# Patient Record
Sex: Male | Born: 1935 | Race: White | Hispanic: No | Marital: Married | Smoking: Former smoker
Health system: Southern US, Community
[De-identification: ages and names within clinical notes are randomized; demographics above are authoritative.]

## PROBLEM LIST (undated history)

## (undated) DIAGNOSIS — I2510 Atherosclerotic heart disease of native coronary artery without angina pectoris: Secondary | ICD-10-CM

## (undated) DIAGNOSIS — I110 Hypertensive heart disease with heart failure: Secondary | ICD-10-CM

## (undated) DIAGNOSIS — I4891 Unspecified atrial fibrillation: Secondary | ICD-10-CM

## (undated) DIAGNOSIS — N400 Enlarged prostate without lower urinary tract symptoms: Secondary | ICD-10-CM

## (undated) DIAGNOSIS — E785 Hyperlipidemia, unspecified: Secondary | ICD-10-CM

## (undated) DIAGNOSIS — I5032 Chronic diastolic (congestive) heart failure: Secondary | ICD-10-CM

## (undated) DIAGNOSIS — F0390 Unspecified dementia without behavioral disturbance: Secondary | ICD-10-CM

## (undated) DIAGNOSIS — I739 Peripheral vascular disease, unspecified: Secondary | ICD-10-CM

---

## 2019-12-28 DIAGNOSIS — I5032 Chronic diastolic (congestive) heart failure: Secondary | ICD-10-CM | POA: Diagnosis not present

## 2019-12-28 DIAGNOSIS — M5416 Radiculopathy, lumbar region: Secondary | ICD-10-CM

## 2019-12-28 DIAGNOSIS — I10 Essential (primary) hypertension: Secondary | ICD-10-CM

## 2019-12-28 DIAGNOSIS — I739 Peripheral vascular disease, unspecified: Secondary | ICD-10-CM

## 2019-12-28 DIAGNOSIS — F05 Delirium due to known physiological condition: Secondary | ICD-10-CM | POA: Diagnosis not present

## 2019-12-28 DIAGNOSIS — G2581 Restless legs syndrome: Secondary | ICD-10-CM

## 2019-12-28 DIAGNOSIS — I480 Paroxysmal atrial fibrillation: Secondary | ICD-10-CM | POA: Diagnosis not present

## 2019-12-28 DIAGNOSIS — N400 Enlarged prostate without lower urinary tract symptoms: Secondary | ICD-10-CM

## 2019-12-28 DIAGNOSIS — N390 Urinary tract infection, site not specified: Secondary | ICD-10-CM | POA: Diagnosis not present

## 2019-12-30 ENCOUNTER — Emergency Department: Payer: Medicare Other

## 2019-12-30 ENCOUNTER — Other Ambulatory Visit: Payer: Self-pay

## 2019-12-30 ENCOUNTER — Inpatient Hospital Stay
Admission: EM | Admit: 2019-12-30 | Discharge: 2020-01-03 | DRG: 177 | Disposition: A | Discharge status: Skilled Nursing Facility | Payer: Medicare Other | Source: Skilled Nursing Facility | Attending: Family Medicine | Admitting: Family Medicine

## 2019-12-30 ENCOUNTER — Encounter: Payer: Self-pay | Admitting: Intensive Care

## 2019-12-30 DIAGNOSIS — Y846 Urinary catheterization as the cause of abnormal reaction of the patient, or of later complication, without mention of misadventure at the time of the procedure: Secondary | ICD-10-CM | POA: Diagnosis present

## 2019-12-30 DIAGNOSIS — J1289 Other viral pneumonia: Secondary | ICD-10-CM | POA: Diagnosis present

## 2019-12-30 DIAGNOSIS — R0902 Hypoxemia: Secondary | ICD-10-CM | POA: Diagnosis not present

## 2019-12-30 DIAGNOSIS — I739 Peripheral vascular disease, unspecified: Secondary | ICD-10-CM | POA: Diagnosis present

## 2019-12-30 DIAGNOSIS — K219 Gastro-esophageal reflux disease without esophagitis: Secondary | ICD-10-CM | POA: Diagnosis present

## 2019-12-30 DIAGNOSIS — D62 Acute posthemorrhagic anemia: Secondary | ICD-10-CM | POA: Diagnosis present

## 2019-12-30 DIAGNOSIS — U071 COVID-19: Principal | ICD-10-CM | POA: Diagnosis present

## 2019-12-30 DIAGNOSIS — L89312 Pressure ulcer of right buttock, stage 2: Secondary | ICD-10-CM | POA: Diagnosis present

## 2019-12-30 DIAGNOSIS — Z66 Do not resuscitate: Secondary | ICD-10-CM | POA: Diagnosis present

## 2019-12-30 DIAGNOSIS — G40909 Epilepsy, unspecified, not intractable, without status epilepticus: Secondary | ICD-10-CM | POA: Diagnosis present

## 2019-12-30 DIAGNOSIS — I110 Hypertensive heart disease with heart failure: Secondary | ICD-10-CM | POA: Diagnosis present

## 2019-12-30 DIAGNOSIS — J9601 Acute respiratory failure with hypoxia: Secondary | ICD-10-CM | POA: Diagnosis present

## 2019-12-30 DIAGNOSIS — T380X5A Adverse effect of glucocorticoids and synthetic analogues, initial encounter: Secondary | ICD-10-CM | POA: Diagnosis present

## 2019-12-30 DIAGNOSIS — I4891 Unspecified atrial fibrillation: Secondary | ICD-10-CM | POA: Diagnosis present

## 2019-12-30 DIAGNOSIS — C642 Malignant neoplasm of left kidney, except renal pelvis: Secondary | ICD-10-CM | POA: Diagnosis present

## 2019-12-30 DIAGNOSIS — G9341 Metabolic encephalopathy: Secondary | ICD-10-CM | POA: Diagnosis present

## 2019-12-30 DIAGNOSIS — E785 Hyperlipidemia, unspecified: Secondary | ICD-10-CM | POA: Diagnosis present

## 2019-12-30 DIAGNOSIS — I5032 Chronic diastolic (congestive) heart failure: Secondary | ICD-10-CM | POA: Diagnosis present

## 2019-12-30 DIAGNOSIS — R310 Gross hematuria: Secondary | ICD-10-CM | POA: Diagnosis present

## 2019-12-30 DIAGNOSIS — T83031A Leakage of indwelling urethral catheter, initial encounter: Secondary | ICD-10-CM | POA: Diagnosis present

## 2019-12-30 DIAGNOSIS — Z7901 Long term (current) use of anticoagulants: Secondary | ICD-10-CM

## 2019-12-30 DIAGNOSIS — F0390 Unspecified dementia without behavioral disturbance: Secondary | ICD-10-CM | POA: Diagnosis not present

## 2019-12-30 DIAGNOSIS — I2510 Atherosclerotic heart disease of native coronary artery without angina pectoris: Secondary | ICD-10-CM | POA: Diagnosis present

## 2019-12-30 DIAGNOSIS — L8990 Pressure ulcer of unspecified site, unspecified stage: Secondary | ICD-10-CM | POA: Diagnosis not present

## 2019-12-30 DIAGNOSIS — Z87440 Personal history of urinary (tract) infections: Secondary | ICD-10-CM

## 2019-12-30 DIAGNOSIS — N400 Enlarged prostate without lower urinary tract symptoms: Secondary | ICD-10-CM | POA: Diagnosis present

## 2019-12-30 DIAGNOSIS — R319 Hematuria, unspecified: Secondary | ICD-10-CM

## 2019-12-30 DIAGNOSIS — I482 Chronic atrial fibrillation, unspecified: Secondary | ICD-10-CM | POA: Diagnosis present

## 2019-12-30 HISTORY — DX: Chronic diastolic (congestive) heart failure: I50.32

## 2019-12-30 HISTORY — DX: Unspecified dementia, unspecified severity, without behavioral disturbance, psychotic disturbance, mood disturbance, and anxiety: F03.90

## 2019-12-30 HISTORY — DX: Peripheral vascular disease, unspecified: I73.9

## 2019-12-30 HISTORY — DX: Atherosclerotic heart disease of native coronary artery without angina pectoris: I25.10

## 2019-12-30 HISTORY — DX: Unspecified atrial fibrillation: I48.91

## 2019-12-30 HISTORY — DX: Benign prostatic hyperplasia without lower urinary tract symptoms: N40.0

## 2019-12-30 HISTORY — DX: Hypertensive heart disease with heart failure: I11.0

## 2019-12-30 HISTORY — DX: Hyperlipidemia, unspecified: E78.5

## 2019-12-30 LAB — COMPREHENSIVE METABOLIC PANEL
ALT: 22 U/L (ref 0–44)
AST: 24 U/L (ref 15–41)
Albumin: 2.6 g/dL — ABNORMAL LOW (ref 3.5–5.0)
Alkaline Phosphatase: 53 U/L (ref 38–126)
Anion gap: 10 (ref 5–15)
BUN: 17 mg/dL (ref 8–23)
CO2: 21 mmol/L — ABNORMAL LOW (ref 22–32)
Calcium: 7.7 mg/dL — ABNORMAL LOW (ref 8.9–10.3)
Chloride: 103 mmol/L (ref 98–111)
Creatinine, Ser: 0.84 mg/dL (ref 0.61–1.24)
GFR calc Af Amer: >60 mL/min (ref >60)
GFR calc non Af Amer: >60 mL/min (ref >60)
Glucose, Bld: 133 mg/dL — ABNORMAL HIGH (ref 70–99)
Potassium: 4.1 mmol/L (ref 3.5–5.1)
Sodium: 134 mmol/L — ABNORMAL LOW (ref 135–145)
Total Bilirubin: 0.8 mg/dL (ref 0.3–1.2)
Total Protein: 6.8 g/dL (ref 6.5–8.1)

## 2019-12-30 LAB — CBC WITH DIFFERENTIAL/PLATELET
Abs Immature Granulocytes: 0.33 10*3/uL — ABNORMAL HIGH (ref 0.00–0.07)
Basophils Absolute: 0 10*3/uL (ref 0.0–0.1)
Basophils Relative: 0 %
Eosinophils Absolute: 0 10*3/uL (ref 0.0–0.5)
Eosinophils Relative: 0 %
HCT: 38.1 % — ABNORMAL LOW (ref 39.0–52.0)
Hemoglobin: 12.8 g/dL — ABNORMAL LOW (ref 13.0–17.0)
Immature Granulocytes: 5 %
Lymphocytes Relative: 7 %
Lymphs Abs: 0.5 10*3/uL — ABNORMAL LOW (ref 0.7–4.0)
MCH: 31.1 pg (ref 26.0–34.0)
MCHC: 33.6 g/dL (ref 30.0–36.0)
MCV: 92.7 fL (ref 80.0–100.0)
Monocytes Absolute: 2.7 10*3/uL — ABNORMAL HIGH (ref 0.1–1.0)
Monocytes Relative: 39 %
Neutro Abs: 3.3 10*3/uL (ref 1.7–7.7)
Neutrophils Relative %: 49 %
Platelets: 184 10*3/uL (ref 150–400)
RBC: 4.11 MIL/uL — ABNORMAL LOW (ref 4.22–5.81)
RDW: 12.9 % (ref 11.5–15.5)
WBC: 6.9 10*3/uL (ref 4.0–10.5)
nRBC: 0 % (ref 0.0–0.2)

## 2019-12-30 LAB — URINALYSIS, COMPLETE (UACMP) WITH MICROSCOPIC
Bilirubin Urine: NEGATIVE
Glucose, UA: NEGATIVE mg/dL
Ketones, ur: NEGATIVE mg/dL
Leukocytes,Ua: NEGATIVE
Nitrite: POSITIVE — AB
Protein, ur: 100 mg/dL — AB
RBC / HPF: >50 RBC/hpf — ABNORMAL HIGH (ref 0–5)
Specific Gravity, Urine: 1.008 (ref 1.005–1.030)
Squamous Epithelial / HPF: NONE SEEN (ref 0–5)
pH: 6 (ref 5.0–8.0)

## 2019-12-30 LAB — POC SARS CORONAVIRUS 2 AG: SARS Coronavirus 2 Ag: POSITIVE — AB

## 2019-12-30 LAB — PROTIME-INR
INR: 1.7 — ABNORMAL HIGH (ref 0.8–1.2)
Prothrombin Time: 19.4 seconds — ABNORMAL HIGH (ref 11.4–15.2)

## 2019-12-30 LAB — C-REACTIVE PROTEIN: CRP: 17.6 mg/dL — ABNORMAL HIGH (ref <1.0)

## 2019-12-30 LAB — ABO/RH: ABO/RH(D): A POS

## 2019-12-30 LAB — FIBRIN DERIVATIVES D-DIMER (ARMC ONLY): Fibrin derivatives D-dimer (ARMC): 1256.7 ng/mL (FEU) — ABNORMAL HIGH (ref 0.00–499.00)

## 2019-12-30 LAB — FERRITIN: Ferritin: 344 ng/mL — ABNORMAL HIGH (ref 24–336)

## 2019-12-30 LAB — LACTATE DEHYDROGENASE: LDH: 165 U/L (ref 98–192)

## 2019-12-30 LAB — TROPONIN I (HIGH SENSITIVITY): Troponin I (High Sensitivity): 36 ng/L — ABNORMAL HIGH (ref <18)

## 2019-12-30 LAB — PROCALCITONIN: Procalcitonin: <0.1 ng/mL

## 2019-12-30 MED ORDER — AMIODARONE HCL 200 MG PO TABS
100.0000 mg | ORAL_TABLET | Freq: Every day | ORAL | Status: DC
  Administered 2019-12-30 – 2020-01-03 (×5): 100 mg via ORAL
  Filled 2019-12-30 (×5): qty 1

## 2019-12-30 MED ORDER — SODIUM CHLORIDE 0.9 % IV SOLN
200.0000 mg | Freq: Once | INTRAVENOUS | Status: AC
  Administered 2019-12-30: 200 mg via INTRAVENOUS
  Filled 2019-12-30: qty 200

## 2019-12-30 MED ORDER — LEVETIRACETAM 500 MG PO TABS
500.0000 mg | ORAL_TABLET | Freq: Every evening | ORAL | Status: DC
  Administered 2019-12-30 – 2020-01-02 (×4): 500 mg via ORAL
  Filled 2019-12-30 (×4): qty 1

## 2019-12-30 MED ORDER — TAMSULOSIN HCL 0.4 MG PO CAPS
0.4000 mg | ORAL_CAPSULE | Freq: Every day | ORAL | Status: DC
  Administered 2019-12-30 – 2020-01-03 (×5): 0.4 mg via ORAL
  Filled 2019-12-30 (×5): qty 1

## 2019-12-30 MED ORDER — LEVETIRACETAM 250 MG PO TABS
250.0000 mg | ORAL_TABLET | Freq: Every day | ORAL | Status: DC
  Administered 2019-12-30 – 2020-01-03 (×5): 250 mg via ORAL
  Filled 2019-12-30 (×5): qty 1

## 2019-12-30 MED ORDER — THIAMINE HCL 100 MG PO TABS
100.0000 mg | ORAL_TABLET | Freq: Every day | ORAL | Status: DC
  Administered 2019-12-30 – 2019-12-31 (×2): 100 mg via ORAL
  Filled 2019-12-30 (×3): qty 1

## 2019-12-30 MED ORDER — GABAPENTIN 100 MG PO CAPS
100.0000 mg | ORAL_CAPSULE | Freq: Three times a day (TID) | ORAL | Status: DC
  Administered 2019-12-30 – 2020-01-01 (×6): 100 mg via ORAL
  Filled 2019-12-30 (×6): qty 1

## 2019-12-30 MED ORDER — ALBUTEROL SULFATE HFA 108 (90 BASE) MCG/ACT IN AERS
1.0000 | INHALATION_SPRAY | Freq: Two times a day (BID) | RESPIRATORY_TRACT | Status: DC | PRN
  Administered 2019-12-30: 1 via RESPIRATORY_TRACT
  Filled 2019-12-30: qty 6.7

## 2019-12-30 MED ORDER — SODIUM CHLORIDE 0.9 % IR SOLN
3000.0000 mL | Status: DC
  Administered 2019-12-30: 3000 mL

## 2019-12-30 MED ORDER — VITAMIN D3 25 MCG (1000 UNIT) PO TABS
1000.0000 [IU] | ORAL_TABLET | Freq: Every day | ORAL | Status: DC
  Administered 2019-12-30 – 2020-01-03 (×5): 1000 [IU] via ORAL
  Filled 2019-12-30 (×10): qty 1

## 2019-12-30 MED ORDER — LOSARTAN POTASSIUM 50 MG PO TABS
100.0000 mg | ORAL_TABLET | Freq: Every day | ORAL | Status: DC
  Administered 2019-12-30 – 2020-01-03 (×5): 100 mg via ORAL
  Filled 2019-12-30 (×5): qty 2

## 2019-12-30 MED ORDER — SODIUM CHLORIDE 0.9% FLUSH
3.0000 mL | Freq: Two times a day (BID) | INTRAVENOUS | Status: DC
  Administered 2019-12-30 – 2020-01-02 (×6): 3 mL via INTRAVENOUS

## 2019-12-30 MED ORDER — ZINC SULFATE 220 (50 ZN) MG PO CAPS
220.0000 mg | ORAL_CAPSULE | Freq: Every day | ORAL | Status: DC
  Administered 2019-12-30 – 2020-01-03 (×5): 220 mg via ORAL
  Filled 2019-12-30 (×5): qty 1

## 2019-12-30 MED ORDER — SODIUM CHLORIDE 0.9% FLUSH: 3.0000 mL | INTRAVENOUS | Status: DC | PRN

## 2019-12-30 MED ORDER — CHLORHEXIDINE GLUCONATE CLOTH 2 % EX PADS
6.0000 | MEDICATED_PAD | Freq: Every day | CUTANEOUS | Status: DC
  Administered 2019-12-31 – 2020-01-03 (×4): 6 via TOPICAL

## 2019-12-30 MED ORDER — SODIUM CHLORIDE 0.9 % IV SOLN: INTRAVENOUS | Status: DC

## 2019-12-30 MED ORDER — ACETAMINOPHEN 325 MG PO TABS: 650.0000 mg | ORAL_TABLET | Freq: Four times a day (QID) | ORAL | Status: DC | PRN

## 2019-12-30 MED ORDER — ASCORBIC ACID 500 MG PO TABS
1000.0000 mg | ORAL_TABLET | Freq: Three times a day (TID) | ORAL | Status: DC
  Administered 2019-12-30 – 2020-01-03 (×12): 1000 mg via ORAL
  Filled 2019-12-30 (×11): qty 2

## 2019-12-30 MED ORDER — ONDANSETRON HCL 4 MG PO TABS: 4.0000 mg | ORAL_TABLET | Freq: Four times a day (QID) | ORAL | Status: DC | PRN

## 2019-12-30 MED ORDER — DEXAMETHASONE SODIUM PHOSPHATE 10 MG/ML IJ SOLN
6.0000 mg | INTRAMUSCULAR | Status: DC
  Administered 2019-12-30 – 2020-01-03 (×5): 6 mg via INTRAVENOUS
  Filled 2019-12-30: qty 0.6
  Filled 2019-12-30: qty 1
  Filled 2019-12-30 (×3): qty 0.6

## 2019-12-30 MED ORDER — SODIUM CHLORIDE 0.9% FLUSH
3.0000 mL | Freq: Two times a day (BID) | INTRAVENOUS | Status: DC
  Administered 2019-12-30 – 2020-01-01 (×3): 3 mL via INTRAVENOUS

## 2019-12-30 MED ORDER — EMPTY CONTAINERS FLEXIBLE MISC
900.0000 mg | Freq: Once | Status: DC
  Filled 2019-12-30: qty 90

## 2019-12-30 MED ORDER — SODIUM CHLORIDE 0.9 % IV SOLN
100.0000 mg | Freq: Every day | INTRAVENOUS | Status: AC
  Administered 2019-12-31 – 2020-01-03 (×4): 100 mg via INTRAVENOUS
  Filled 2019-12-30: qty 20
  Filled 2019-12-30 (×3): qty 100

## 2019-12-30 MED ORDER — METOPROLOL SUCCINATE ER 25 MG PO TB24
25.0000 mg | ORAL_TABLET | Freq: Every day | ORAL | Status: DC
  Administered 2019-12-30 – 2020-01-03 (×5): 25 mg via ORAL
  Filled 2019-12-30 (×5): qty 1

## 2019-12-30 MED ORDER — ONDANSETRON HCL 4 MG/2ML IJ SOLN: 4.0000 mg | Freq: Four times a day (QID) | INTRAMUSCULAR | Status: DC | PRN

## 2019-12-30 MED ORDER — AMLODIPINE BESYLATE 5 MG PO TABS
5.0000 mg | ORAL_TABLET | Freq: Every day | ORAL | Status: DC
  Administered 2019-12-30 – 2020-01-03 (×5): 5 mg via ORAL
  Filled 2019-12-30 (×5): qty 1

## 2019-12-30 MED ORDER — FOLIC ACID 1 MG PO TABS
1.0000 mg | ORAL_TABLET | Freq: Every day | ORAL | Status: DC
  Administered 2019-12-30 – 2020-01-01 (×3): 1 mg via ORAL
  Filled 2019-12-30 (×3): qty 1

## 2019-12-30 MED ORDER — TRAMADOL HCL 50 MG PO TABS
50.0000 mg | ORAL_TABLET | Freq: Four times a day (QID) | ORAL | Status: DC | PRN
  Administered 2019-12-30: 50 mg via ORAL
  Filled 2019-12-30: qty 1

## 2019-12-30 MED ORDER — SODIUM CHLORIDE 0.9 % IV SOLN: 250.0000 mL | INTRAVENOUS | Status: DC | PRN

## 2019-12-30 MED ORDER — ADULT MULTIVITAMIN W/MINERALS CH
1.0000 | ORAL_TABLET | Freq: Every day | ORAL | Status: DC
  Administered 2019-12-30 – 2019-12-31 (×2): 1 via ORAL
  Filled 2019-12-30 (×2): qty 1

## 2019-12-30 MED ORDER — PANTOPRAZOLE SODIUM 40 MG PO TBEC
40.0000 mg | DELAYED_RELEASE_TABLET | Freq: Every day | ORAL | Status: DC
  Administered 2019-12-30 – 2019-12-31 (×2): 40 mg via ORAL
  Filled 2019-12-30 (×2): qty 1

## 2019-12-30 MED ORDER — TRANEXAMIC ACID-NACL 1000-0.7 MG/100ML-% IV SOLN
1000.0000 mg | Freq: Once | INTRAVENOUS | Status: AC
  Administered 2019-12-30: 1000 mg via INTRAVENOUS
  Filled 2019-12-30: qty 100

## 2019-12-30 NOTE — ED Notes (Signed)
Patient placed on 2L due to O2 sats of 87% on room air.MD made aware

## 2019-12-30 NOTE — ED Provider Notes (Addendum)
[facility] Emergency Department Provider Note       Time seen: ----------------------------------------- 8:30 AM on 12/30/2019 -----------------------------------------   I have reviewed the triage vital signs and the nursing notes.  HISTORY   Chief Complaint Hematuria    HPI Jimmy Moore is a 85 y.o. male with a history of UTI, heart failure, dementia, atrial fibrillation, peripheral vascular disease, hyperlipidemia, BPH who presents to the ER from a skilled nursing facility with frank blood coming out of his Foley catheter.  Patient arrives alert and oriented, is DNR.  Patient reports he has had prostate issues before, also that he takes Eliquis.  He denies any fevers, chills, dysuria or other complaints.  No past medical history on file.  There are no problems to display for this patient.   Allergies Patient has no allergy information on record.  Social History Social History   Tobacco Use  . Smoking status: Not on file  Substance Use Topics  . Alcohol use: Not on file  . Drug use: Not on file   Review of Systems Constitutional: Negative for fever. Cardiovascular: Negative for chest pain. Respiratory: Negative for shortness of breath. Gastrointestinal: Negative for abdominal pain, vomiting and diarrhea. Genitourinary: Positive for hematuria Musculoskeletal: Negative for back pain. Skin: Negative for rash. Neurological: Negative for headaches, focal weakness or numbness.  All systems negative/normal/unremarkable except as stated in the HPI  ____________________________________________   PHYSICAL EXAM:  VITAL SIGNS: ED Triage Vitals  Enc Vitals Group     BP      Pulse      Resp      Temp      Temp src      SpO2      Weight      Height      Head Circumference      Peak Flow      Pain Score      Pain Loc      Pain Edu?      Excl. in [place]?     Constitutional: Alert and oriented.  Chronically ill-appearing, no  distress Eyes: Conjunctivae are normal. Normal extraocular movements. Cardiovascular: Normal rate, regular rhythm. No murmurs, rubs, or gallops. Respiratory: Normal respiratory effort without tachypnea nor retractions. Breath sounds are clear and equal bilaterally. No wheezes/rales/rhonchi. Gastrointestinal: Soft and nontender. Normal bowel sounds Genitourinary: Pilar Plate blood coming from his Foley catheter Musculoskeletal: Nontender with normal range of motion in extremities. No lower extremity tenderness nor edema. Neurologic:  Normal speech and language. No gross focal neurologic deficits are appreciated.  Skin:  Skin is warm, dry and intact. No rash noted. Psychiatric: Mood and affect are normal. Speech and behavior are normal.  ____________________________________________  ED COURSE:  As part of my medical decision making, I reviewed the following data within the [place] History obtained from family if available, nursing notes, old chart and ekg, as well as notes from prior ED visits. Patient presented for gross hematuria, we will assess with labs and imaging as indicated at this time. Clinical Course as of Dec 29 1201  Wed Dec 30, 2019  1004 Patient will be receiving continuous bladder irrigation, reversal agent for Eliquis.   [JW]  1048 Urine appears to be clearing   [JW]    Clinical Course User Index [JW] Earleen Newport, MD   Procedures  Jimmy Moore was evaluated in Emergency Department on 12/30/2019 for the symptoms described in the history of present illness. He was evaluated in the context  of the global COVID-19 pandemic, which necessitated consideration that the patient might be at risk for infection with the SARS-CoV-2 virus that causes COVID-19. Institutional protocols and algorithms that pertain to the evaluation of patients at risk for COVID-19 are in a state of rapid change based on information released by regulatory bodies including the CDC and  federal and state organizations. These policies and algorithms were followed during the patient's care in the ED.  ____________________________________________   LABS (pertinent positives/negatives)  Labs Reviewed  CBC WITH DIFFERENTIAL/PLATELET - Abnormal; Notable for the following components:      Result Value   RBC 4.11 (*)    Hemoglobin 12.8 (*)    HCT 38.1 (*)    Lymphs Abs 0.5 (*)    Monocytes Absolute 2.7 (*)    Abs Immature Granulocytes 0.33 (*)    All other components within normal limits  COMPREHENSIVE METABOLIC PANEL - Abnormal; Notable for the following components:   Sodium 134 (*)    CO2 21 (*)    Glucose, Bld 133 (*)    Calcium 7.7 (*)    Albumin 2.6 (*)    All other components within normal limits  PROTIME-INR - Abnormal; Notable for the following components:   Prothrombin Time 19.4 (*)    INR 1.7 (*)    All other components within normal limits  URINE CULTURE  URINALYSIS, COMPLETE (UACMP) WITH MICROSCOPIC  TROPONIN I (HIGH SENSITIVITY)  ____________________________________________   CRITICAL CARE Performed by: Laurence Aly   Total critical care time: 30 minutes  Critical care time was exclusive of separately billable procedures and treating other patients.  Critical care was necessary to treat or prevent imminent or life-threatening deterioration.  Critical care was time spent personally by me on the following activities: development of treatment plan with patient and/or surrogate as well as nursing, discussions with consultants, evaluation of patient's response to treatment, examination of patient, obtaining history from patient or surrogate, ordering and performing treatments and interventions, ordering and review of laboratory studies, ordering and review of radiographic studies, pulse oximetry and re-evaluation of patient's condition.   RADIOLOGY:  CXR:   IMPRESSION:  Patchy airspace opacity in the right mid lung.   DIFFERENTIAL  DIAGNOSIS   Coagulopathy, anemia, bladder cancer, prostate cancer, BPH, UTI  FINAL ASSESSMENT AND PLAN  Gross hematuria, COVID-19, hypoxemia   Plan: The patient had presented for gross hematuria. Patient's labs were essentially as expected, we performed bladder irrigation with clearing of his urine.  He also received a dose of tranexamic acid.  I will advise holding his Eliquis until hematuria resolves. He also was hypoxic mildly to the upper 80s. Chest x-ray showed some mild changes associated with COVID-19. He was placed on 2 L of nasal cannula oxygen. I will discuss with the hospitalist for admission.   Laurence Aly, MD    Note: This note was generated in part or whole with voice recognition software. Voice recognition is usually quite accurate but there are transcription errors that can and very often do occur. I apologize for any typographical errors that were not detected and corrected.     Earleen Newport, MD 12/30/19 1202    Earleen Newport, MD 12/30/19 [phone]

## 2019-12-30 NOTE — ED Triage Notes (Addendum)
Patient arrived by EMS from [facility] for bloody catheter. A&O x4 upon arrival. EMS vitals 172/76b/p, p89, 98.1oral temp. DNR with paper work. Diagnosed COVID+ 12/29/19

## 2019-12-30 NOTE — Progress Notes (Signed)
Remdesivir - Pharmacy Brief Note   O:  ALT: 22 CXR: Patchy airspace opacity in the right mid lung SpO2: 87% on room air Diagnosed COVID+ 12/29/19 (from SNF), per ED RN note   A/P:  Remdesivir 200 mg IVPB once followed by 100 mg IVPB daily x 4 days.   Chinita Greenland PharmD Clinical Pharmacist 12/30/2019

## 2019-12-30 NOTE — H&P (Signed)
History and Physical    Jimmy Moore [id number] DOB: [birth year] DOA: 12/30/2019  **Will admit patient based on the expectation that the patient will need hospitalization/ hospital care that crosses at least 2 midnights  PCP: Venia Carbon, MD   Attending physician: Blaine Hamper  Patient coming from/Resides with: SNF  Chief Complaint: Gross hematuria  HPI: Jimmy Moore is a 85 y.o. male with medical history significant for chronic atrial fibrillation on Eliquis, hypertension, chronic diastolic congestive heart failure, mild dementia, peripheral vascular disease and dyslipidemia.  Patient presented to the ER with reports of gross hematuria.  View of outpatient documentation patient had follow-up with his primary urologist beginning on 12/9 for issues related to hematuria.  Imaging at that time revealed no evidence of urinary stone.  He subsequently underwent cystoscopy on 12/14 field no mucosal lesions and the ureteral orifices were clear.  There was an incidental finding on his preprocedure CT concerning a small left renal mass that is likely renal cell carcinoma with urologist recommending repeat bladder sonography in 6 months.  He was also continued on Flomax.  After arrival to the ER patient was started on continuous bladder irrigation with some clearing of the frank hematuria but still with dark urinary output and he was given one-time dose of Tranexamic acid.  Patient also had tested positive for COVID-19 on 12/29.  Shortly after arrival patient developed low-grade fevers with hypoxemia on room air down to 87%.  Saturations improved with placement on oxygen.  Chest x-ray revealed patchy airspace opacity in the right midlung.  Patient will be admitted to the telemetry unit for hypoxemia secondary to COVID-19 pneumonia.  He has been started on Decadron and remdesivir.  Upon my evaluation of the patient he was quite lethargic as typical for Covid patient.  He was having difficulty contributing to  history but did admit to having some coughing today that was nonproductive.  ED Course: Vital Signs: BP (!) 164/84 (BP Location: Left Arm)   Pulse 88   Temp 99 F (37.2 C) (Oral)   Resp 18   Ht 6\' 4"  (1.93 m)   Wt 95.3 kg   SpO2 (!) 87%   BMI 25.56 kg/m   Review of Systems:  In addition to the HPI above,  No known fever-chills, myalgias or other constitutional symptoms although did develop the symptoms after arrival to the ER No Headache, changes with Vision or hearing, new weakness, tingling, numbness in any extremity, dizziness, dysarthria or word finding difficulty, gait disturbance or imbalance, tremors or seizure activity No problems swallowing food or Liquids, indigestion/reflux, choking or coughing while eating, abdominal pain with or after eating No Chest pain, + Cough or Shortness of Breath, palpitations, orthopnea or DOE No Abdominal pain, N/V, melena,hematochezia, dark tarry stools, constipation No dysuria, malodorous urine, + hematuria  w/o flank pain No new skin rashes, lesions, masses or bruises, No new joint pains, aches, swelling or redness No recent unintentional weight gain or loss No polyuria, polydypsia or polyphagia   Past Medical History:  Diagnosis Date  . Atherosclerotic heart disease of native coronary artery without angina pectoris   . Benign prostatic hyperplasia without lower urinary tract symptoms   . Chronic diastolic (congestive) heart failure ([place])   . Hyperlipidemia   . Hypertensive heart disease with heart failure ([place])   . Peripheral vascular disease ([place])   . Unspecified atrial fibrillation ([place])   . Unspecified dementia without behavioral disturbance ([place])     History reviewed. No pertinent surgical history.  Social History   Socioeconomic History  . Marital status: Married    Spouse name: Not on file  . Number of children: Not on file  . Years of education: Not on file  . Highest education level: Not on file  Occupational History   . Not on file  Tobacco Use  . Smoking status: Former Research scientist (life sciences)  . Smokeless tobacco: Never Used  Substance and Sexual Activity  . Alcohol use: Not Currently  . Drug use: Never  . Sexual activity: Not on file  Other Topics Concern  . Not on file  Social History Narrative  . Not on file   Social Determinants of Health   Financial Resource Strain:   . Difficulty of Paying Living Expenses: Not on file  Food Insecurity:   . Worried About Charity fundraiser in the Last Year: Not on file  . Ran Out of Food in the Last Year: Not on file  Transportation Needs:   . Lack of Transportation (Medical): Not on file  . Lack of Transportation (Non-Medical): Not on file  Physical Activity:   . Days of Exercise per Week: Not on file  . Minutes of Exercise per Session: Not on file  Stress:   . Feeling of Stress : Not on file  Social Connections:   . Frequency of Communication with Friends and Family: Not on file  . Frequency of Social Gatherings with Friends and Family: Not on file  . Attends Religious Services: Not on file  . Active Member of Clubs or Organizations: Not on file  . Attends Archivist Meetings: Not on file  . Marital Status: Not on file  Intimate Partner Violence:   . Fear of Current or Ex-Partner: Not on file  . Emotionally Abused: Not on file  . Physically Abused: Not on file  . Sexually Abused: Not on file    Mobility: Cane Work history: Not obtained   No Known Allergies  History reviewed. No pertinent family history.   Prior to Admission medications   Medication Sig Start Date End Date Taking? Authorizing Provider  acetaminophen (TYLENOL) 325 MG tablet Take 650 mg by mouth every 4 (four) hours as needed for mild pain or fever.   Yes [provider]  albuterol (VENTOLIN HFA) 108 (90 Base) MCG/ACT inhaler Inhale 1 puff into the lungs every 12 (twelve) hours as needed for wheezing or shortness of breath.   Yes [provider]  amiodarone  (PACERONE) 100 MG tablet Take 100 mg by mouth daily.   Yes [provider]  amLODipine (NORVASC) 5 MG tablet Take 5 mg by mouth daily.   Yes [provider]  apixaban (ELIQUIS) 5 MG TABS tablet Take 5 mg by mouth 2 (two) times daily.   Yes [provider]  dextromethorphan-guaiFENesin (TUSSIN DM) 10-100 MG/5ML liquid Take 10 mLs by mouth every 4 (four) hours as needed for cough.   Yes [provider]  gabapentin (NEURONTIN) 100 MG capsule Take 100 mg by mouth 3 (three) times daily.   Yes [provider]  levETIRAcetam (KEPPRA) 250 MG tablet Take 500 mg by mouth every evening.   Yes [provider]  levETIRAcetam (KEPPRA) 250 MG tablet Take 250 mg by mouth daily.   Yes [provider]  losartan (COZAAR) 100 MG tablet Take 100 mg by mouth daily.   Yes [provider]  metoprolol succinate (TOPROL-XL) 25 MG 24 hr tablet Take 25 mg by mouth daily.   Yes [provider]  omeprazole (PRILOSEC) 20 MG capsule Take 20 mg by mouth at bedtime.   Yes [provider]  tamsulosin (FLOMAX) 0.4 MG CAPS capsule Take 0.4 mg by mouth daily.   Yes [provider]  traMADol (ULTRAM) 50 MG tablet Take 50 mg by mouth every 6 (six) hours as needed for moderate pain.   Yes [provider]    Physical Exam: Vitals:   12/30/19 0832 12/30/19 0834 12/30/19 1032 12/30/19 1124  BP:  (!) 155/75 (!) 159/66 (!) 164/84  Pulse:  88 84 88  Resp:  18 16 18   Temp:  99.1 F (37.3 C)  99 F (37.2 C)  TempSrc:  Oral  Oral  SpO2:  92% 91% (!) 87%  Weight: 95.3 kg     Height: 6\' 4"  (1.93 m)         Constitutional: NAD, calm, comfortable Eyes: PERRL, lids and conjunctivae normal bilateral scleral injection ENMT: Mucous membranes are dry. Posterior pharynx clear of any exudate or lesions.Normal dentition.  Neck: normal, supple, no masses, no thyromegaly Respiratory: Bilateral fine expiratory crackles in mid fields on  anterior exam.  Normal respiratory effort. No accessory muscle use.  2 L oxygen Cardiovascular: Regular rate and rhythm, no murmurs / rubs / gallops. No extremity edema. 2+ pedal pulses. No carotid bruits.  Abdomen: no tenderness, no masses palpated. No hepatosplenomegaly. Bowel sounds positive.  GU: Foley catheter in place with drainage system intact.  Dark maroon-colored urine noted in bedside bag Musculoskeletal: no clubbing / cyanosis. No joint deformity upper and lower extremities. Good ROM, no contractures. Normal muscle tone.  Skin: no rashes, lesions, ulcers. No induration Neurologic: CN 2-12 grossly intact. Sensation intact, DTR normal. Strength 5/5 x all 4 extremities.  Psychiatric: Patient very sleepy but awakens easily.  Does have some difficulty in answering questions but overall appears to be oriented x3.   Labs on Admission: I have personally reviewed following labs and imaging studies  CBC: Recent Labs  Lab 12/30/19 0837  WBC 6.9  NEUTROABS 3.3  HGB 12.8*  HCT 38.1*  MCV 92.7  PLT [id number]   Basic Metabolic Panel: Recent Labs  Lab 12/30/19 0837  NA 134*  K 4.1  CL 103  CO2 21*  GLUCOSE 133*  BUN 17  CREATININE 0.84  CALCIUM 7.7*   GFR: Estimated Creatinine Clearance: 74.6 mL/min (by C-G formula based on SCr of 0.84 mg/dL). Liver Function Tests: Recent Labs  Lab 12/30/19 0837  AST 24  ALT 22  ALKPHOS 53  BILITOT 0.8  PROT 6.8  ALBUMIN 2.6*   No results for input(s): LIPASE, AMYLASE in the last 168 hours. No results for input(s): AMMONIA in the last 168 hours. Coagulation Profile: Recent Labs  Lab 12/30/19 0837  INR 1.7*   Cardiac Enzymes: No results for input(s): CKTOTAL, CKMB, CKMBINDEX, TROPONINI in the last 168 hours. BNP (last 3 results) No results for input(s): PROBNP in the last 8760 hours. HbA1C: No results for input(s): HGBA1C in the last 72 hours. CBG: No results for input(s): GLUCAP in the last 168 hours. Lipid Profile: No results  for input(s): CHOL, HDL, LDLCALC, TRIG, CHOLHDL, LDLDIRECT in the last 72 hours. Thyroid Function Tests: No results for input(s): TSH, T4TOTAL, FREET4, T3FREE, THYROIDAB in the last 72 hours. Anemia Panel: No results for input(s): VITAMINB12, FOLATE, FERRITIN, TIBC, IRON, RETICCTPCT in the last 72 hours. Urine analysis: No results found for: COLORURINE, APPEARANCEUR, LABSPEC, PHURINE, GLUCOSEU, HGBUR, BILIRUBINUR, KETONESUR, PROTEINUR, UROBILINOGEN, NITRITE, LEUKOCYTESUR Sepsis  Labs: @LABRCNTIP (procalcitonin:4,lacticidven:4) )No results found for this or any previous visit (from the past 240 hour(s)).   Radiological Exams on Admission: DG Chest 1 View  Result Date: 12/30/2019 CLINICAL DATA:  Dyspnea EXAM: CHEST  1 VIEW COMPARISON:  07/21/2013 FINDINGS: Cardiac shadow is stable. Postsurgical changes are again noted. Lungs are well aerated bilaterally. Chronic blunting of the left costophrenic angle is noted. Some patchy airspace opacity is noted in the right mid lung laterally which may represent some early infiltrate. No other focal abnormality is noted. IMPRESSION: Patchy airspace opacity in the right mid lung. Electronically Signed   By: Inez Catalina M.D.   On: 12/30/2019 12:57      Assessment/Plan Principal Problem:   Acute hypoxemic respiratory failure due to COVID-19 [facility]) Patient presented with gross hematuria with a known diagnosis of COVID-19.  After arrival developed hypoxemia and chest x-ray revealed right upper lobe pneumonia Continue supportive care with oxygen/ental IV fluid hydration in context of febrile illness Decadron 6 mg IV daily Remdesevir per pharmacy-current LFTs are within normal limits Begin zinc, vitamin C and vitamin D Follow inflammatory markers daily: Ferritin 344 LDH 165 HS troponin 36 D-dimer 1256.7 (given gross hematuria unable to use full dose anticoagulation or Lovenox for DVT prophylaxis)  Active Problems:   Gross hematuria Presented with gross  hematuria which has improved with bladder irrigation Patient has recently undergone cystoscopy in the outpatient setting by his urologist on 12/14 with no lesions demonstrated Patient has been experiencing issues with hematuria since 12/9-May need to reevaluate use of Eliquis prior to discharge and utilize aspirin and Plavix instead (see below) Continue Flomax    Chronic atrial fibrillation (HCC) Currently rate controlled Continue amiodarone but watch LFTs especially with concomitant administration of remdesivir Continue metoprolol CHADVASC=5 May need to consider transitioning from Eliquis to aspirin and Plavix for stroke prophylaxis    Hypertensive heart disease with diastolic congestive heart failure (HCC) Continue Toprol, Norvasc and Cozaar     Unspecified dementia without behavioral disturbance ([place]) Not on any pharmacotherapy prior to admission    ?  Seizure disorder Continue Keppra    Hyperlipidemia Not on pharmacotherapy prior to admission  **Additional lab, imaging and/or diagnostic evaluation at discretion of supervising physician  DVT prophylaxis: SCDs Code Status: Full Family Communication: Daughter [name] Disposition Plan: Inpatient, patient presented with hematuria that has responded to bladder irrigation.  He also had known COVID-19 prior to admission and has developed hypoxemia with chest x-ray confirming right upper lobe pneumonia therefore has been started on IV fluids, IV Decadron and IV remdesivir. Consults called: None    Erin Hearing ANP-BC [facility] Pager [phone]   If 7PM-7AM, please contact night-coverage [email or web address] Password [email or web address]  12/30/2019, 1:47 PM

## 2019-12-30 NOTE — Discharge Instructions (Addendum)
Please stop Eliquis until there is no further bleeding from the foley catheter

## 2019-12-30 NOTE — ED Notes (Signed)
Patients brief and underpad changed. Patient pulled up in bed

## 2019-12-31 LAB — COMPREHENSIVE METABOLIC PANEL
ALT: 20 U/L (ref 0–44)
AST: 23 U/L (ref 15–41)
Albumin: 2.4 g/dL — ABNORMAL LOW (ref 3.5–5.0)
Alkaline Phosphatase: 50 U/L (ref 38–126)
Anion gap: 10 (ref 5–15)
BUN: 24 mg/dL — ABNORMAL HIGH (ref 8–23)
CO2: 19 mmol/L — ABNORMAL LOW (ref 22–32)
Calcium: 7.6 mg/dL — ABNORMAL LOW (ref 8.9–10.3)
Chloride: 107 mmol/L (ref 98–111)
Creatinine, Ser: 0.72 mg/dL (ref 0.61–1.24)
GFR calc Af Amer: >60 mL/min (ref >60)
GFR calc non Af Amer: >60 mL/min (ref >60)
Glucose, Bld: 119 mg/dL — ABNORMAL HIGH (ref 70–99)
Potassium: 4.2 mmol/L (ref 3.5–5.1)
Sodium: 136 mmol/L (ref 135–145)
Total Bilirubin: 0.8 mg/dL (ref 0.3–1.2)
Total Protein: 6.3 g/dL — ABNORMAL LOW (ref 6.5–8.1)

## 2019-12-31 LAB — CBC WITH DIFFERENTIAL/PLATELET
Abs Immature Granulocytes: 0.28 10*3/uL — ABNORMAL HIGH (ref 0.00–0.07)
Basophils Absolute: 0 10*3/uL (ref 0.0–0.1)
Basophils Relative: 0 %
Eosinophils Absolute: 0 10*3/uL (ref 0.0–0.5)
Eosinophils Relative: 0 %
HCT: 33.5 % — ABNORMAL LOW (ref 39.0–52.0)
Hemoglobin: 11.5 g/dL — ABNORMAL LOW (ref 13.0–17.0)
Immature Granulocytes: 7 %
Lymphocytes Relative: 10 %
Lymphs Abs: 0.4 10*3/uL — ABNORMAL LOW (ref 0.7–4.0)
MCH: 30.6 pg (ref 26.0–34.0)
MCHC: 34.3 g/dL (ref 30.0–36.0)
MCV: 89.1 fL (ref 80.0–100.0)
Monocytes Absolute: 1.2 10*3/uL — ABNORMAL HIGH (ref 0.1–1.0)
Monocytes Relative: 28 %
Neutro Abs: 2.2 10*3/uL (ref 1.7–7.7)
Neutrophils Relative %: 55 %
Platelets: 179 10*3/uL (ref 150–400)
RBC: 3.76 MIL/uL — ABNORMAL LOW (ref 4.22–5.81)
RDW: 12.9 % (ref 11.5–15.5)
WBC: 4.1 10*3/uL (ref 4.0–10.5)
nRBC: 0 % (ref 0.0–0.2)

## 2019-12-31 LAB — URINE CULTURE: Special Requests: NORMAL

## 2019-12-31 LAB — C-REACTIVE PROTEIN: CRP: 13.8 mg/dL — ABNORMAL HIGH (ref <1.0)

## 2019-12-31 LAB — MAGNESIUM: Magnesium: 2 mg/dL (ref 1.7–2.4)

## 2019-12-31 LAB — FIBRIN DERIVATIVES D-DIMER (ARMC ONLY): Fibrin derivatives D-dimer (ARMC): 875.49 ng/mL (FEU) — ABNORMAL HIGH (ref 0.00–499.00)

## 2019-12-31 LAB — PHOSPHORUS: Phosphorus: 3.4 mg/dL (ref 2.5–4.6)

## 2019-12-31 LAB — FERRITIN: Ferritin: 265 ng/mL (ref 24–336)

## 2019-12-31 MED ORDER — ORAL CARE MOUTH RINSE
15.0000 mL | Freq: Two times a day (BID) | OROMUCOSAL | Status: DC
  Administered 2020-01-01 – 2020-01-03 (×4): 15 mL via OROMUCOSAL

## 2019-12-31 MED ORDER — ENSURE ENLIVE PO LIQD
237.0000 mL | Freq: Three times a day (TID) | ORAL | Status: DC
  Administered 2019-12-31 – 2020-01-03 (×7): 237 mL via ORAL

## 2019-12-31 NOTE — Progress Notes (Signed)
PROGRESS NOTE    Jimmy Moore  [id number] DOB: September 05, 1929 DOA: 12/30/2019 PCP: Venia Carbon, MD      Brief Narrative:  Mr. Jimmy Moore is a 85 y.o. M with dementia, CAD, dCHF, HTN, PVD, and Afib on Eliquis who presented with materia.  In the ER, he had some hematuria, but this cleared with bladder irrigation and a one-time dose of Tranexamic acid.    Incidentally, while in the ER, he desaturated to 87% and developed a fever.  COVID testing was positive and chest x-ray showed patchy airspace opacity, and so he was admitted for coronavirus.       Assessment & Plan:  Coronavirus pneumonitis with hypoxemia Patient presents with SARS-CoV-2 infection, hypoxia and pneumonia on CXR in the setting of the ongoing 2020 COVID-19 pandemic.  Respiratory effort still seems normal.   -Continue remdesivir, day 2 of 5 -Continue dexamethasone, day 2 -VTE PPx with SCDs, given gross hematuria -Continue Zinc and Vitamin C -Flutter valve, turn, cough, incentive spirometry q2hrs while awake -Nutrition consult  Acute metabolic encephalopathy in the setting of dementia Encephalopathy from COVID-19  Gross hematuria Recent cystoscopy within the last 2 weeks, without findings.  Left renal mass, suspected to be renal cell carcinoma.  Urine culture pending -Follow urine culture -Hold Eliquis for now  Coronary disease, secondary prevention Hypertension Chronic diastolic CHF Blood pressure normal, appears euvolemic. -Continue amlodipine, losartan, metoprolol  Atrial fibrillation, chronic Rate controlled -Hold Eliquis -Continue amiodarone, metoprolol  BPH -Continue tamsulosin  GERD -Continue pantoprazole  Seizure disorder -Continue Keppra   Anemia, mild acute blood loss -Trend hemoglobin  High risk of malnutrition He has moderate muscle mass and fat loss.       MDM and disposition: The below labs and imaging reports were reviewed and summarized above.  Medication  management as above.  The patient was admitted with gross hematuria COVID-19.  He remains hypoxic, confused.          DVT prophylaxis: SCDs Code Status: Full code Family Communication: Daughter and son-in-law by phone     Procedures:     Antimicrobials:      Culture data:   12/30 blood culture x2-no growth to date  12/30 urine culture-no growth to date        Subjective: Patient is fiddling with his telemetry box, pulse ox, and oxygen tubing.  He is weak, confused, unable to state where he is or what is going on.  No fever overnight.  No chest discomfort, respiratory distress, chest pain.  Objective: Vitals:   12/30/19 1813 12/30/19 2056 12/31/19 0450 12/31/19 0742  BP: (!) 162/68 (!) 143/62 128/60 (!) 140/58  Pulse: 74 74 64 68  Resp: 18 16 19 18   Temp: 97.8 F (36.6 C) (!) 97.5 F (36.4 C) 97.6 F (36.4 C) 98 F (36.7 C)  TempSrc: Oral Oral Oral Oral  SpO2: 93% 93% 93% 91%  Weight:      Height:        Intake/Output Summary (Last 24 hours) at 12/31/2019 1321 Last data filed at 12/31/2019 1131 Gross per 24 hour  Intake 885.31 ml  Output 3500 ml  Net -2614.69 ml   Filed Weights   12/30/19 0832  Weight: 95.3 kg    Examination: General appearance: Elderly, chronically ill-appearing adult male, alert and in no obvious distress.   HEENT: Anicteric, conjunctiva pink, lids and lashes normal. No nasal deformity, discharge, epistaxis.  Lips dry, oropharynx tacky dry, no oral lesions, hearing diminished.   Skin:  Warm and dry.  Senile purpura. Cardiac: Irregularly irregular, normal rate, nl S1-S2, no murmurs appreciated.  Capillary refill is brisk.  JVP normal.  No LE edema.  Radial pulses 2+ and symmetric. Respiratory: Normal respiratory rate and rhythm, respiratory effort shallow.  CTAB without rales or wheezes.  Diminished bilaterally. Abdomen: Abdomen soft.  No TTP or guarding. No ascites, distension, hepatosplenomegaly.   MSK: No deformities or  effusions.  Diffuse loss of subcutaneous muscle mass and fat. Neuro: Awake and alert.  EOMI, moves all extremities with severe generalized weakness. Speech fluent.    Psych: Sensorium intact and responding to questions, attention diminished. Affect blunted.  Judgment and insight appear impaired.          Data Reviewed: I have personally reviewed following labs and imaging studies:  CBC: Recent Labs  Lab 12/30/19 0837 12/31/19 0539  WBC 6.9 4.1  NEUTROABS 3.3 2.2  HGB 12.8* 11.5*  HCT 38.1* 33.5*  MCV 92.7 89.1  PLT 184 0000000   Basic Metabolic Panel: Recent Labs  Lab 12/30/19 0837 12/31/19 0539  NA 134* 136  K 4.1 4.2  CL 103 107  CO2 21* 19*  GLUCOSE 133* 119*  BUN 17 24*  CREATININE 0.84 0.72  CALCIUM 7.7* 7.6*  MG  --  2.0  PHOS  --  3.4   GFR: Estimated Creatinine Clearance: 78.4 mL/min (by C-G formula based on SCr of 0.72 mg/dL). Liver Function Tests: Recent Labs  Lab 12/30/19 0837 12/31/19 0539  AST 24 23  ALT 22 20  ALKPHOS 53 50  BILITOT 0.8 0.8  PROT 6.8 6.3*  ALBUMIN 2.6* 2.4*   No results for input(s): LIPASE, AMYLASE in the last 168 hours. No results for input(s): AMMONIA in the last 168 hours. Coagulation Profile: Recent Labs  Lab 12/30/19 0837  INR 1.7*   Cardiac Enzymes: No results for input(s): CKTOTAL, CKMB, CKMBINDEX, TROPONINI in the last 168 hours. BNP (last 3 results) No results for input(s): PROBNP in the last 8760 hours. HbA1C: No results for input(s): HGBA1C in the last 72 hours. CBG: No results for input(s): GLUCAP in the last 168 hours. Lipid Profile: No results for input(s): CHOL, HDL, LDLCALC, TRIG, CHOLHDL, LDLDIRECT in the last 72 hours. Thyroid Function Tests: No results for input(s): TSH, T4TOTAL, FREET4, T3FREE, THYROIDAB in the last 72 hours. Anemia Panel: Recent Labs    12/30/19 1338 12/31/19 0539  FERRITIN 344* 265   Urine analysis:    Component Value Date/Time   COLORURINE BROWN (A) 12/30/2019 1337    APPEARANCEUR CLOUDY (A) 12/30/2019 1337   LABSPEC 1.008 12/30/2019 1337   PHURINE 6.0 12/30/2019 1337   GLUCOSEU NEGATIVE 12/30/2019 1337   HGBUR LARGE (A) 12/30/2019 1337   BILIRUBINUR NEGATIVE 12/30/2019 1337   [place] 12/30/2019 1337   PROTEINUR 100 (A) 12/30/2019 1337   NITRITE POSITIVE (A) 12/30/2019 1337   LEUKOCYTESUR NEGATIVE 12/30/2019 1337   Sepsis Labs: @LABRCNTIP (procalcitonin:4,lacticacidven:4)  ) Recent Results (from the past 240 hour(s))  Culture, blood (Routine X 2) w Reflex to ID Panel     Status: None (Preliminary result)   Collection Time: 12/30/19  1:38 PM   Specimen: BLOOD  Result Value Ref Range Status   Specimen Description BLOOD BLOOD RIGHT ARM  Final   Special Requests   Final    BOTTLES DRAWN AEROBIC AND ANAEROBIC Blood Culture adequate volume   Culture   Final    NO GROWTH < 24 HOURS Performed at [facility], [place],  [place], [place] [address]    Report Status PENDING  Incomplete  Culture, blood (Routine X 2) w Reflex to ID Panel     Status: None (Preliminary result)   Collection Time: 12/30/19  1:38 PM   Specimen: BLOOD  Result Value Ref Range Status   Specimen Description BLOOD RIGHT ANTECUBITAL  Final   Special Requests   Final    BOTTLES DRAWN AEROBIC AND ANAEROBIC Blood Culture adequate volume   Culture   Final    NO GROWTH < 24 HOURS Performed at [facility], [address], [place], [place] [address]    Report Status PENDING  Incomplete         Radiology Studies: DG Chest 1 View  Result Date: 12/30/2019 CLINICAL DATA:  Dyspnea EXAM: CHEST  1 VIEW COMPARISON:  07/21/2013 FINDINGS: Cardiac shadow is stable. Postsurgical changes are again noted. Lungs are well aerated bilaterally. Chronic blunting of the left costophrenic angle is noted. Some patchy airspace opacity is noted in the right mid lung laterally which may represent some early infiltrate. No other focal abnormality is noted.  IMPRESSION: Patchy airspace opacity in the right mid lung. Electronically Signed   By: Inez Catalina M.D.   On: 12/30/2019 12:57        Scheduled Meds: . amiodarone  100 mg Oral Daily  . amLODipine  5 mg Oral Daily  . vitamin C  1,000 mg Oral TID  . Chlorhexidine Gluconate Cloth  6 each Topical Daily  . cholecalciferol  1,000 Units Oral Daily  . dexamethasone (DECADRON) injection  6 mg Intravenous Q24H  . folic acid  1 mg Oral Daily  . gabapentin  100 mg Oral TID  . levETIRAcetam  250 mg Oral Daily  . levETIRAcetam  500 mg Oral QPM  . losartan  100 mg Oral Daily  . metoprolol succinate  25 mg Oral Daily  . multivitamin with minerals  1 tablet Oral Daily  . pantoprazole  40 mg Oral Daily  . sodium chloride flush  3 mL Intravenous Q12H  . sodium chloride flush  3 mL Intravenous Q12H  . tamsulosin  0.4 mg Oral Daily  . thiamine  100 mg Oral Daily  . zinc sulfate  220 mg Oral Daily   Continuous Infusions: . sodium chloride    . sodium chloride 75 mL/hr at 12/31/19 0505  . remdesivir 100 mg in NS 100 mL 100 mg (12/31/19 0856)  . sodium chloride irrigation       LOS: 1 day    Time spent: 25 minutes      Edwin Dada, MD [facility] 12/31/2019, 1:21 PM     Please page through [place]:  [email or web address] Contact charge nurse for password If 7PM-7AM, please contact night-coverage

## 2019-12-31 NOTE — TOC Initial Note (Signed)
Transition of Care [facility]) - Initial/Assessment Note    Patient Details  Name: Galvin Mccallie MRN: [id number] Date of Birth: 1929-01-09  [facility] CM/SW Contact:    Ross Ludwig, LCSW Phone Number: 12/31/2019, 5:17 PM  Clinical Narrative:                 Patient is an 85 year old male who is alert and oriented x2. Patient is from [facility] where he is a short term rehab patient.  CSW spoke to [place] in admissions and they will be able to accept patient back once he is medically ready for discharge.  CSW spoke to patient's daughter Gayla Medicus and she would like patient to return back to [facility] once he is ready for discharge.   Expected Discharge Plan: Skilled Nursing Facility Barriers to Discharge: Continued Medical Work up   Patient Goals and CMS Choice Patient states their goals for this hospitalization and ongoing recovery are:: To return back to [facility] short term rehab. CMS Medicare.gov Compare Post Acute Care list provided to:: Patient Represenative (must comment) Choice offered to / list presented to : [place] / Guardian  Expected Discharge Plan and Services Expected Discharge Plan: [place] Choice: [place] arrangements for the past 2 months: [place], [place]                 DME Arranged: N/A         HH Arranged: NA          Prior Living Arrangements/Services Living arrangements for the past 2 months: [place], [place] Lives with:: Facility Resident Patient language and need for interpreter reviewed:: Yes Do you feel safe going back to the place where you live?: Yes      Need for Family Participation in Patient Care: Yes (Comment) Care giver support system in place?: Yes (comment)   Criminal Activity/Legal Involvement Pertinent to Current Situation/Hospitalization: No - Comment as needed  Activities of Daily Living      Permission Sought/Granted Permission sought to share information with : Facility Sport and exercise psychologist, Family Supports Permission granted to share information with : Yes, Verbal Permission Granted  Share Information with NAME: Gayla Medicus Daughter   [phone]  Permission granted to share info w AGENCY: SNF admissions        Emotional Assessment Appearance:: Appears stated age   Affect (typically observed): Accepting, Appropriate, Calm, Stable Orientation: : Oriented to Self, Oriented to Place Alcohol / Substance Use: Not Applicable Psych Involvement: No (comment)  Admission diagnosis:  Hypoxia [R09.02] Hematuria, unspecified type [R31.9] Acute hypoxemic respiratory failure due to COVID-19 ([place]) [U07.1, J96.01] COVID-19 [U07.1] Patient Active Problem List   Diagnosis Date Noted  . Gross hematuria 12/30/2019  . Acute hypoxemic respiratory failure due to COVID-19 ([place]) 12/30/2019  . Hypertensive heart disease with heart failure ([place])   . Chronic atrial fibrillation ([place])   . Unspecified dementia without behavioral disturbance ([place])   . Peripheral vascular disease ([place])   . Hyperlipidemia   . Chronic diastolic (congestive) heart failure ([place])    PCP:  Venia Carbon, MD Pharmacy:   [facility], [place] [address] [place] [address] Phone: [phone] Fax: [phone]     Social Determinants of Health (SDOH) Interventions    Readmission Risk Interventions No flowsheet data found.

## 2019-12-31 NOTE — Progress Notes (Signed)
Initial Nutrition Assessment  DOCUMENTATION CODES:   Not applicable  INTERVENTION:   Ensure Enlive po TID, each supplement provides 350 kcal and 20 grams of protein  Magic cup TID with meals, each supplement provides 290 kcal and 9 grams of protein  MVI daily   Dysphagia 3 diet   NUTRITION DIAGNOSIS:   Increased nutrient needs related to catabolic illness(COVID 19) as evidenced by increased estimated needs.  GOAL:   Patient will meet greater than or equal to 90% of their needs  MONITOR:   PO intake, Supplement acceptance, Labs, Weight trends, Skin, I & O's  REASON FOR ASSESSMENT:   Consult Assessment of nutrition requirement/status  ASSESSMENT:   85 y.o. Male with PMH of chronic atrial fibrillation on Eliquis, hypertension, chronic diastolic congestive heart failure, mild dementia, peripheral vascular disease and dyslipidemia, who presents with hematuria and COVID-19 infection.   Unable to speak with pt r/t confusion and dementia. Suspect pt with poor appetite and oral intake pta r/t COVID 19. RD will add supplements to help pt meet his estimated needs. RD will also liberalize diet as pt is not likely eating enough to exceed any nutrient limits. Per chart, pt appears fairly weight stable pta. Per MD note, pt with some muscle and fat depletions. Pt likely at high risk for developing malnutrition.   Medications reviewed and include: Vitamin C, vitamin D, dexamethasone, folic acid, MVI, protonix, thiamine   Labs reviewed: K 4.2 wnl, P 3.4 wnl, Mg 2.0 wnl  Unable to complete Nutrition-Focused physical exam at this time as pt with COVID 19.   Diet Order:   Diet Order            Diet Heart Room service appropriate? Yes; Fluid consistency: Thin  Diet effective now             EDUCATION NEEDS:   No education needs have been identified at this time  Skin:  Skin Assessment: Reviewed RN Assessment(MASD)  Last BM:  12/30  Height:   Ht Readings from Last 1  Encounters:  12/30/19 6\' 4"  (1.93 m)    Weight:   Wt Readings from Last 1 Encounters:  12/30/19 95.3 kg    Ideal Body Weight:  91.8 kg  BMI:  Body mass index is 25.56 kg/m.  Estimated Nutritional Needs:   Kcal:  2400-2700kcal/day  Protein:  >125g/day  Fluid:  2.4L/day  Koleen Distance MS, RD, LDN Pager #- [phone] Office#- [phone] After Hours Pager: [phone]

## 2019-12-31 NOTE — NC FL2 (Signed)
[place] LEVEL OF CARE SCREENING TOOL     IDENTIFICATION  Patient Name: Jimmy Moore Birthdate: Jul 20, 1929 Sex: male Admission Date (Current Location): 12/30/2019  [place] and [place] Number:  Engineering geologist and Address:  [facility], [address] [place], [place] [address]      Provider Number: [id number]  Attending Physician Name and Address:  Edwin Dada, *  Relative Name and Phone Number:  Gayla Medicus Daughter   [phone]    Current Level of Care: Hospital Recommended Level of Care: [place] Prior Approval Number:    Date Approved/Denied:   PASRR Number: [id number]  Discharge Plan: SNF    Current Diagnoses: Patient Active Problem List   Diagnosis Date Noted  . Gross hematuria 12/30/2019  . Acute hypoxemic respiratory failure due to COVID-19 ([place]) 12/30/2019  . Hypertensive heart disease with heart failure ([place])   . Chronic atrial fibrillation ([place])   . Unspecified dementia without behavioral disturbance ([place])   . Peripheral vascular disease ([place])   . Hyperlipidemia   . Chronic diastolic (congestive) heart failure (H[facility])     Orientation RESPIRATION BLADDER Height & Weight     Self  O2 Incontinent Weight: 210 lb (95.3 kg) Height:  6\' 4"  (193 cm)  BEHAVIORAL SYMPTOMS/MOOD NEUROLOGICAL BOWEL NUTRITION STATUS      Continent Diet(Dysphagia 3)  AMBULATORY STATUS COMMUNICATION OF NEEDS Skin   Limited Assist Verbally Normal                       Personal Care Assistance Level of Assistance  Bathing, Dressing, Feeding Bathing Assistance: Limited assistance Feeding assistance: Limited assistance Dressing Assistance: Limited assistance     Functional Limitations Info  Sight, Speech, Hearing Sight Info: Adequate Hearing Info: Adequate Speech Info: Adequate    SPECIAL CARE FACTORS FREQUENCY  PT (By licensed PT), OT (By licensed OT)     PT Frequency: Minimum 5x a week OT  Frequency: Minimum 5x a week            Contractures Contractures Info: Not present    Additional Factors Info  Code Status, Allergies Code Status Info: Full code Allergies Info: No Known Allergies           Current Medications (12/31/2019):  This is the current hospital active medication list Current Facility-Administered Medications  Medication Dose Route Frequency Provider Last Rate Last Admin  . 0.9 %  sodium chloride infusion  250 mL Intravenous PRN Samella Parr, NP      . acetaminophen (TYLENOL) tablet 650 mg  650 mg Oral Q6H PRN Samella Parr, NP      . albuterol (VENTOLIN HFA) 108 (90 Base) MCG/ACT inhaler 1 puff  1 puff Inhalation Q12H PRN Samella Parr, NP   1 puff at 12/30/19 1412  . amiodarone (PACERONE) tablet 100 mg  100 mg Oral Daily Samella Parr, NP   100 mg at 12/31/19 0837  . amLODipine (NORVASC) tablet 5 mg  5 mg Oral Daily Samella Parr, NP   5 mg at 12/31/19 0836  . ascorbic acid (VITAMIN C) tablet 1,000 mg  1,000 mg Oral TID Samella Parr, NP   1,000 mg at 12/31/19 1537  . Chlorhexidine Gluconate Cloth 2 % PADS 6 each  6 each Topical Daily Ivor Costa, MD   6 each at 12/31/19 1539  . cholecalciferol (VITAMIN D) tablet 1,000 Units  1,000 Units Oral Daily Samella Parr, NP  1,000 Units at 12/31/19 0836  . dexamethasone (DECADRON) injection 6 mg  6 mg Intravenous Q24H Samella Parr, NP   6 mg at 12/31/19 0844  . feeding supplement (ENSURE ENLIVE) (ENSURE ENLIVE) liquid 237 mL  237 mL Oral TID BM Danford, Suann Larry, MD   237 mL at 12/31/19 1539  . folic acid (FOLVITE) tablet 1 mg  1 mg Oral Daily Samella Parr, NP   1 mg at 12/31/19 [id number]  . gabapentin (NEURONTIN) capsule 100 mg  100 mg Oral TID Samella Parr, NP   100 mg at 12/31/19 1537  . levETIRAcetam (KEPPRA) tablet 250 mg  250 mg Oral Daily Samella Parr, NP   250 mg at 12/31/19 [id number]  . levETIRAcetam (KEPPRA) tablet 500 mg  500 mg Oral QPM Samella Parr, NP   500 mg at  12/30/19 2253  . losartan (COZAAR) tablet 100 mg  100 mg Oral Daily Samella Parr, NP   100 mg at 12/31/19 [id number]  . metoprolol succinate (TOPROL-XL) 24 hr tablet 25 mg  25 mg Oral Daily Samella Parr, NP   25 mg at 12/31/19 [id number]  . multivitamin with minerals tablet 1 tablet  1 tablet Oral Daily Samella Parr, NP   1 tablet at 12/31/19 0836  . ondansetron (ZOFRAN) tablet 4 mg  4 mg Oral Q6H PRN Samella Parr, NP       Or  . ondansetron [facility]) injection 4 mg  4 mg Intravenous Q6H PRN Samella Parr, NP      . pantoprazole (PROTONIX) EC tablet 40 mg  40 mg Oral Daily Samella Parr, NP   40 mg at 12/31/19 0836  . remdesivir 100 mg in sodium chloride 0.9 % 100 mL IVPB  100 mg Intravenous Daily Samella Parr, NP 200 mL/hr at 12/31/19 0856 100 mg at 12/31/19 0856  . sodium chloride flush (NS) 0.9 % injection 3 mL  3 mL Intravenous Q12H Samella Parr, NP   3 mL at 12/31/19 0838  . sodium chloride flush (NS) 0.9 % injection 3 mL  3 mL Intravenous Q12H Samella Parr, NP   3 mL at 12/31/19 0838  . sodium chloride flush (NS) 0.9 % injection 3 mL  3 mL Intravenous PRN Erin Hearing L, NP      . sodium chloride irrigation 0.9 % 3,000 mL  3,000 mL Irrigation Continuous Earleen Newport, MD   3,000 mL at 12/30/19 1015  . tamsulosin (FLOMAX) capsule 0.4 mg  0.4 mg Oral Daily Samella Parr, NP   0.4 mg at 12/31/19 0837  . thiamine tablet 100 mg  100 mg Oral Daily Samella Parr, NP   100 mg at 12/31/19 [id number]  . traMADol (ULTRAM) tablet 50 mg  50 mg Oral Q6H PRN Samella Parr, NP   50 mg at 12/30/19 1413  . zinc sulfate capsule 220 mg  220 mg Oral Daily Samella Parr, NP   220 mg at 12/31/19 [id number]     Discharge Medications: Please see discharge summary for a list of discharge medications.  Relevant Imaging Results:  Relevant Lab Results:   Additional Information SSN [id number]  Ross Ludwig, LCSW

## 2020-01-01 LAB — CBC WITH DIFFERENTIAL/PLATELET
Abs Immature Granulocytes: 0.34 10*3/uL — ABNORMAL HIGH (ref 0.00–0.07)
Basophils Absolute: 0 10*3/uL (ref 0.0–0.1)
Basophils Relative: 0 %
Eosinophils Absolute: 0 10*3/uL (ref 0.0–0.5)
Eosinophils Relative: 0 %
HCT: 33.4 % — ABNORMAL LOW (ref 39.0–52.0)
Hemoglobin: 11.9 g/dL — ABNORMAL LOW (ref 13.0–17.0)
Immature Granulocytes: 4 %
Lymphocytes Relative: 7 %
Lymphs Abs: 0.6 10*3/uL — ABNORMAL LOW (ref 0.7–4.0)
MCH: 31.1 pg (ref 26.0–34.0)
MCHC: 35.6 g/dL (ref 30.0–36.0)
MCV: 87.2 fL (ref 80.0–100.0)
Monocytes Absolute: 2.2 10*3/uL — ABNORMAL HIGH (ref 0.1–1.0)
Monocytes Relative: 26 %
Neutro Abs: 5.4 10*3/uL (ref 1.7–7.7)
Neutrophils Relative %: 63 %
Platelets: 238 10*3/uL (ref 150–400)
RBC: 3.83 MIL/uL — ABNORMAL LOW (ref 4.22–5.81)
RDW: 13 % (ref 11.5–15.5)
WBC: 8.5 10*3/uL (ref 4.0–10.5)
nRBC: 0 % (ref 0.0–0.2)

## 2020-01-01 LAB — COMPREHENSIVE METABOLIC PANEL
ALT: 19 U/L (ref 0–44)
AST: 24 U/L (ref 15–41)
Albumin: 2.4 g/dL — ABNORMAL LOW (ref 3.5–5.0)
Alkaline Phosphatase: 42 U/L (ref 38–126)
Anion gap: 8 (ref 5–15)
BUN: 30 mg/dL — ABNORMAL HIGH (ref 8–23)
CO2: 21 mmol/L — ABNORMAL LOW (ref 22–32)
Calcium: 8.1 mg/dL — ABNORMAL LOW (ref 8.9–10.3)
Chloride: 111 mmol/L (ref 98–111)
Creatinine, Ser: 0.82 mg/dL (ref 0.61–1.24)
GFR calc Af Amer: >60 mL/min (ref >60)
GFR calc non Af Amer: >60 mL/min (ref >60)
Glucose, Bld: 128 mg/dL — ABNORMAL HIGH (ref 70–99)
Potassium: 4.1 mmol/L (ref 3.5–5.1)
Sodium: 140 mmol/L (ref 135–145)
Total Bilirubin: 0.6 mg/dL (ref 0.3–1.2)
Total Protein: 6.1 g/dL — ABNORMAL LOW (ref 6.5–8.1)

## 2020-01-01 LAB — FIBRIN DERIVATIVES D-DIMER (ARMC ONLY): Fibrin derivatives D-dimer (ARMC): 773.17 ng/mL (FEU) — ABNORMAL HIGH (ref 0.00–499.00)

## 2020-01-01 LAB — PHOSPHORUS: Phosphorus: 2.3 mg/dL — ABNORMAL LOW (ref 2.5–4.6)

## 2020-01-01 LAB — MAGNESIUM: Magnesium: 2.1 mg/dL (ref 1.7–2.4)

## 2020-01-01 LAB — FERRITIN: Ferritin: 278 ng/mL (ref 24–336)

## 2020-01-01 LAB — MRSA PCR SCREENING: MRSA by PCR: POSITIVE — AB

## 2020-01-01 LAB — C-REACTIVE PROTEIN: CRP: 8.5 mg/dL — ABNORMAL HIGH (ref <1.0)

## 2020-01-01 MED ORDER — MUPIROCIN 2 % EX OINT
TOPICAL_OINTMENT | Freq: Two times a day (BID) | CUTANEOUS | Status: DC
  Filled 2020-01-01: qty 22

## 2020-01-01 NOTE — Progress Notes (Signed)
PROGRESS NOTE    Fordham Meneely  [id number] DOB: 28-Aug-1929 DOA: 12/30/2019 PCP: Venia Carbon, MD      Brief Narrative:  Mr. Sneeringer is a 86 y.o. M with dementia, CAD, dCHF, HTN, PVD, and Afib on Eliquis who presented with materia.  In the ER, he had some hematuria, but this cleared with bladder irrigation and a one-time dose of Tranexamic acid.    Incidentally, while in the ER, he desaturated to 87% and developed a fever.  COVID testing was positive and chest x-ray showed patchy airspace opacity, and so he was admitted for coronavirus.       Assessment & Plan:  Coronavirus pneumonitis with hypoxemia Patient presents with SARS-CoV-2 infection, hypoxia and pneumonia on CXR in the setting of the ongoing 2020 COVID-19 pandemic.  Respiratory effort stable, requiring 2L O2 stable, LFTs stable on remdesivir.  Inflammatory markers improving -Continue remdesivir, day 3 of 5 -Continue dexamethasone, day 2 -Continue VTE PPx with SCDs, given gross hematuria -Continue zinc and Vitamin C -Flutter valve, turn, cough, incentive spirometry q2hrs while awake -Nutrition consult  Acute metabolic encephalopathy in the setting of dementia Encephalopathy from COVID-19  Gross hematuria Recent cystoscopy within the last 2 weeks, without findings.  Left renal mass, suspected to be renal cell carcinoma.  Urine culture with multiple species -Repeat urine culture -Hold Eliquis for now  Coronary disease, secondary prevention Hypertension Chronic diastolic CHF Euvolemic, blood pressure normal -Continue amlodipine, losartan, metoprolol  Atrial fibrillation, chronic Rate normal -Hold Eliquis -Continue amiodarone, metoprolol  BPH -Continue tamsulosin  GERD -Continue pantoprazole  Seizure disorder -Continue Keppra   Anemia, mild acute blood loss Stable Hgb  -Trend hemoglobin  High risk of malnutrition He has moderate muscle mass and fat loss.       MDM and  disposition: The below labs and imaging reports reviewed and summarized above.  Medication management as above.   The patient was admitted with gross hematuria COVID-19.  He remains hypoxic, will need ongoing remdesivir          DVT prophylaxis: SCDs Code Status: Full code Family Communication: Daughter and son-in-law by phone     Procedures:     Antimicrobials:      Culture data:   12/30 blood culture x2-no growth to date  12/30 urine culture-multiple species  1/1 urine culture-pending        Subjective: Patient is listless, tired, uncomfortable, has some dyspnea.  Hematuria is resolved.  He is very cold.  No chest pain, sputum, vomiting, diarrhea.  Objective: Vitals:   12/31/19 1447 12/31/19 2009 01/01/20 0505 01/01/20 0830  BP: 136/60 136/62 (!) 143/62 (!) 145/54  Pulse: 70 64 63 60  Resp: 18 18 18 18   Temp: 98.2 F (36.8 C) 98.1 F (36.7 C) 98.4 F (36.9 C) 98 F (36.7 C)  TempSrc: Oral Oral Oral Oral  SpO2: 93% 92% 94% 95%  Weight:      Height:        Intake/Output Summary (Last 24 hours) at 01/01/2020 1012 Last data filed at 01/01/2020 0500 Gross per 24 hour  Intake 989.64 ml  Output 950 ml  Net 39.64 ml   Filed Weights   12/30/19 0832  Weight: 95.3 kg    Examination: General appearance: Elderly adult male, alert and in no acute distress.  Appears listless. HEENT: Anicteric, conjunctiva pink, lids and lashes normal. No nasal deformity, discharge, epistaxis.  Lips moist, teeth normal. OP tacky dry, no oral lesions.  Hearing diminished. Skin:  Warm and dry.  No suspicious rashes or lesions. Cardiac: RRR, no murmurs appreciated.  No LE edema.    Respiratory: Respiratory effort shallow only.  CTAB without rales or wheezes. Abdomen: Abdomen soft.  No tenderness palpation. No ascites, distension, hepatosplenomegaly.   MSK: No deformities or effusions of the large joints of the upper or lower extremities bilaterally.  Diffuse loss of  subcutaneous muscle mass of that. Neuro: Awake and alert.  Oriented to self, hospital.  Otherwise confused.  Muscle tone diminished, moves upper extremities with extremely weak strength, symmetric coordination, speech fluent Psych: Sensorium intact and responding to questions, attention diminished.  Affect blunted.  Judgment and insight appear slightly impaired.           Data Reviewed: I have personally reviewed following labs and imaging studies:  CBC: Recent Labs  Lab 12/30/19 0837 12/31/19 0539 01/01/20 0428  WBC 6.9 4.1 8.5  NEUTROABS 3.3 2.2 5.4  HGB 12.8* 11.5* 11.9*  HCT 38.1* 33.5* 33.4*  MCV 92.7 89.1 87.2  PLT 184 179 [id number]   Basic Metabolic Panel: Recent Labs  Lab 12/30/19 0837 12/31/19 0539 01/01/20 0428  NA 134* 136 140  K 4.1 4.2 4.1  CL 103 107 111  CO2 21* 19* 21*  GLUCOSE 133* 119* 128*  BUN 17 24* 30*  CREATININE 0.84 0.72 0.82  CALCIUM 7.7* 7.6* 8.1*  MG  --  2.0 2.1  PHOS  --  3.4 2.3*   GFR: Estimated Creatinine Clearance: 76.4 mL/min (by C-G formula based on SCr of 0.82 mg/dL). Liver Function Tests: Recent Labs  Lab 12/30/19 0837 12/31/19 0539 01/01/20 0428  AST 24 23 24   ALT 22 20 19   ALKPHOS 53 50 42  BILITOT 0.8 0.8 0.6  PROT 6.8 6.3* 6.1*  ALBUMIN 2.6* 2.4* 2.4*   No results for input(s): LIPASE, AMYLASE in the last 168 hours. No results for input(s): AMMONIA in the last 168 hours. Coagulation Profile: Recent Labs  Lab 12/30/19 0837  INR 1.7*   Cardiac Enzymes: No results for input(s): CKTOTAL, CKMB, CKMBINDEX, TROPONINI in the last 168 hours. BNP (last 3 results) No results for input(s): PROBNP in the last 8760 hours. HbA1C: No results for input(s): HGBA1C in the last 72 hours. CBG: No results for input(s): GLUCAP in the last 168 hours. Lipid Profile: No results for input(s): CHOL, HDL, LDLCALC, TRIG, CHOLHDL, LDLDIRECT in the last 72 hours. Thyroid Function Tests: No results for input(s): TSH, T4TOTAL, FREET4,  T3FREE, THYROIDAB in the last 72 hours. Anemia Panel: Recent Labs    12/31/19 0539 01/01/20 0428  FERRITIN 265 278   Urine analysis:    Component Value Date/Time   COLORURINE BROWN (A) 12/30/2019 1337   APPEARANCEUR CLOUDY (A) 12/30/2019 1337   LABSPEC 1.008 12/30/2019 1337   PHURINE 6.0 12/30/2019 1337   GLUCOSEU NEGATIVE 12/30/2019 1337   HGBUR LARGE (A) 12/30/2019 1337   BILIRUBINUR NEGATIVE 12/30/2019 1337   KETONESUR NEGATIVE 12/30/2019 1337   PROTEINUR 100 (A) 12/30/2019 1337   NITRITE POSITIVE (A) 12/30/2019 1337   LEUKOCYTESUR NEGATIVE 12/30/2019 1337   Sepsis Labs: @LABRCNTIP (procalcitonin:4,lacticacidven:4)  ) Recent Results (from the past 240 hour(s))  Urine Culture     Status: Abnormal   Collection Time: 12/30/19 12:19 PM   Specimen: Urine, Random  Result Value Ref Range Status   Specimen Description   Final    URINE, RANDOM Performed at [facility], [address] [place], [place] [address]    Special Requests   Final  Normal Performed at [facility], [address] [place], [place] [address]    Culture MULTIPLE SPECIES PRESENT, SUGGEST RECOLLECTION (A)  Final   Report Status 12/31/2019 FINAL  Final  Culture, blood (Routine X 2) w Reflex to ID Panel     Status: None (Preliminary result)   Collection Time: 12/30/19  1:38 PM   Specimen: BLOOD  Result Value Ref Range Status   Specimen Description BLOOD BLOOD RIGHT ARM  Final   Special Requests   Final    BOTTLES DRAWN AEROBIC AND ANAEROBIC Blood Culture adequate volume   Culture   Final    NO GROWTH 2 DAYS Performed at [facility], [address], [place], [place] [address]    Report Status PENDING  Incomplete  Culture, blood (Routine X 2) w Reflex to ID Panel     Status: None (Preliminary result)   Collection Time: 12/30/19  1:38 PM   Specimen: BLOOD  Result Value Ref Range Status   Specimen Description BLOOD RIGHT ANTECUBITAL  Final   Special Requests   Final      BOTTLES DRAWN AEROBIC AND ANAEROBIC Blood Culture adequate volume   Culture   Final    NO GROWTH 2 DAYS Performed at [facility], [address], [place], [place] [address]    Report Status PENDING  Incomplete  MRSA PCR Screening     Status: Abnormal   Collection Time: 01/01/20  6:00 AM   Specimen: Nasal Mucosa; Nasopharyngeal  Result Value Ref Range Status   MRSA by PCR POSITIVE (A) NEGATIVE Final    Comment:        The GeneXpert MRSA Assay (FDA approved for NASAL specimens only), is one component of a comprehensive MRSA colonization surveillance program. It is not intended to diagnose MRSA infection nor to guide or monitor treatment for MRSA infections. RESULT CALLED TO, READ BACK BY AND VERIFIED WITH: BETH SMITH AT [id number] ON 01/01/2020 [place]. Performed at [facility], [address], [place], [place] [address]          Radiology Studies: DG Chest 1 View  Result Date: 12/30/2019 CLINICAL DATA:  Dyspnea EXAM: CHEST  1 VIEW COMPARISON:  07/21/2013 FINDINGS: Cardiac shadow is stable. Postsurgical changes are again noted. Lungs are well aerated bilaterally. Chronic blunting of the left costophrenic angle is noted. Some patchy airspace opacity is noted in the right mid lung laterally which may represent some early infiltrate. No other focal abnormality is noted. IMPRESSION: Patchy airspace opacity in the right mid lung. Electronically Signed   By: Inez Catalina M.D.   On: 12/30/2019 12:57        Scheduled Meds: . amiodarone  100 mg Oral Daily  . amLODipine  5 mg Oral Daily  . vitamin C  1,000 mg Oral TID  . Chlorhexidine Gluconate Cloth  6 each Topical Daily  . cholecalciferol  1,000 Units Oral Daily  . dexamethasone (DECADRON) injection  6 mg Intravenous Q24H  . feeding supplement (ENSURE ENLIVE)  237 mL Oral TID BM  . levETIRAcetam  250 mg Oral Daily  . levETIRAcetam  500 mg Oral QPM  . losartan  100 mg Oral Daily  . mouth rinse  15 mL Mouth  Rinse BID  . metoprolol succinate  25 mg Oral Daily  . sodium chloride flush  3 mL Intravenous Q12H  . sodium chloride flush  3 mL Intravenous Q12H  . tamsulosin  0.4 mg Oral Daily  . zinc sulfate  220  mg Oral Daily   Continuous Infusions: . sodium chloride    . remdesivir 100 mg in NS 100 mL 100 mg (01/01/20 0847)  . sodium chloride irrigation       LOS: 2 days    Time spent: 25 minutes      Edwin Dada, MD [facility] 01/01/2020, 10:12 AM     Please page through AMION:  [email or web address] Contact charge nurse for password If 7PM-7AM, please contact night-coverage

## 2020-01-02 LAB — CBC WITH DIFFERENTIAL/PLATELET
Abs Immature Granulocytes: 0.6 10*3/uL — ABNORMAL HIGH (ref 0.00–0.07)
Basophils Absolute: 0 10*3/uL (ref 0.0–0.1)
Basophils Relative: 0 %
Eosinophils Absolute: 0 10*3/uL (ref 0.0–0.5)
Eosinophils Relative: 0 %
HCT: 39.3 % (ref 39.0–52.0)
Hemoglobin: 13 g/dL (ref 13.0–17.0)
Immature Granulocytes: 5 %
Lymphocytes Relative: 8 %
Lymphs Abs: 0.9 10*3/uL (ref 0.7–4.0)
MCH: 30.1 pg (ref 26.0–34.0)
MCHC: 33.1 g/dL (ref 30.0–36.0)
MCV: 91 fL (ref 80.0–100.0)
Monocytes Absolute: 2.9 10*3/uL — ABNORMAL HIGH (ref 0.1–1.0)
Monocytes Relative: 25 %
Neutro Abs: 7.4 10*3/uL (ref 1.7–7.7)
Neutrophils Relative %: 62 %
Platelets: 322 10*3/uL (ref 150–400)
RBC: 4.32 MIL/uL (ref 4.22–5.81)
RDW: 13.1 % (ref 11.5–15.5)
WBC: 11.9 10*3/uL — ABNORMAL HIGH (ref 4.0–10.5)
nRBC: 0 % (ref 0.0–0.2)

## 2020-01-02 LAB — COMPREHENSIVE METABOLIC PANEL
ALT: 24 U/L (ref 0–44)
AST: 27 U/L (ref 15–41)
Albumin: 2.7 g/dL — ABNORMAL LOW (ref 3.5–5.0)
Alkaline Phosphatase: 52 U/L (ref 38–126)
Anion gap: 8 (ref 5–15)
BUN: 26 mg/dL — ABNORMAL HIGH (ref 8–23)
CO2: 21 mmol/L — ABNORMAL LOW (ref 22–32)
Calcium: 8.4 mg/dL — ABNORMAL LOW (ref 8.9–10.3)
Chloride: 111 mmol/L (ref 98–111)
Creatinine, Ser: 0.8 mg/dL (ref 0.61–1.24)
GFR calc Af Amer: >60 mL/min (ref >60)
GFR calc non Af Amer: >60 mL/min (ref >60)
Glucose, Bld: 118 mg/dL — ABNORMAL HIGH (ref 70–99)
Potassium: 4 mmol/L (ref 3.5–5.1)
Sodium: 140 mmol/L (ref 135–145)
Total Bilirubin: 0.8 mg/dL (ref 0.3–1.2)
Total Protein: 6.6 g/dL (ref 6.5–8.1)

## 2020-01-02 LAB — FERRITIN: Ferritin: 248 ng/mL (ref 24–336)

## 2020-01-02 LAB — FIBRIN DERIVATIVES D-DIMER (ARMC ONLY): Fibrin derivatives D-dimer (ARMC): 937.78 ng/mL (FEU) — ABNORMAL HIGH (ref 0.00–499.00)

## 2020-01-02 LAB — C-REACTIVE PROTEIN: CRP: 5.7 mg/dL — ABNORMAL HIGH (ref <1.0)

## 2020-01-02 LAB — MAGNESIUM: Magnesium: 2 mg/dL (ref 1.7–2.4)

## 2020-01-02 LAB — PHOSPHORUS: Phosphorus: 2.3 mg/dL — ABNORMAL LOW (ref 2.5–4.6)

## 2020-01-02 MED ORDER — FINASTERIDE 5 MG PO TABS
5.0000 mg | ORAL_TABLET | Freq: Every day | ORAL | Status: DC
  Administered 2020-01-02 – 2020-01-03 (×2): 5 mg via ORAL
  Filled 2020-01-02 (×2): qty 1

## 2020-01-02 NOTE — Progress Notes (Signed)
PROGRESS NOTE    Jimmy Moore  [id number] DOB: 23-May-1929 DOA: 12/30/2019 PCP: Venia Carbon, MD      Brief Narrative:  Jimmy Moore is a 86 y.o. M with dementia, CAD, dCHF, HTN, PVD, and Afib on Eliquis who presented with materia.  In the ER, he had some hematuria, but this cleared with bladder irrigation and a one-time dose of Tranexamic acid.    Incidentally, while in the ER, he desaturated to 87% and developed a fever.  COVID testing was positive and chest x-ray showed patchy airspace opacity, and so he was admitted for coronavirus.       Assessment & Plan:  Coronavirus pneumonitis with hypoxemia Patient presents with SARS-CoV-2 infection, hypoxia and pneumonia on CXR in the setting of the ongoing 2020 COVID-19 pandemic.  Weaned to room air, SPO2 92%, respiratory effort easy.  LFTs stable.  Inflammatory markers improving.  No further fever.  -Continue remdesivir, day 4 5 -Continue dexamethasone, day 3 -Continue VTE prophylaxis with SCDs -Continue zinc and vitamin C -Flutter valve, turn, cough, incentive spirometry q2hrs while awake -Nutrition consult  Acute metabolic encephalopathy in the setting of dementia Encephalopathy from COVID-19, this seems slightly improved  Gross hematuria Recent cystoscopy within the last 2 weeks, without findings.  Left renal mass, suspected to be renal cell carcinoma.  Urine culture with multiple species, repeat urine culture pending Urine still amber-colored today -Follow urine culture -Hold Eliquis for now -Discussed with urology, on-call for Dr. Luetta Nutting, patient's primary urologist.  They noted that unlikely for RCC of that size to cause bleeding, more likely this is prostatic in origin --they recommended starting finasteride, restarting Eliquis cautiously when bleeding improves, trending hemoglobin, and close urology follow-up after Covid isolation over -Start finasteride  Coronary disease, secondary  prevention Hypertension Chronic diastolic CHF Appears euvolemic, blood pressure high normal -Continue amlodipine, losartan, metoprolol  Atrial fibrillation, chronic Rate controlled -Hold Eliquis until hematuria clears slightly more  -Continue amiodarone, metoprolol  BPH -Continue tamsulosin -Start finasteride  GERD -Continue pantoprazole  Seizure disorder -Continue Keppra   Anemia, mild acute blood loss Hemoglobin without change -Trend hemoglobin  High risk of malnutrition He has moderate muscle mass and fat loss.       MDM and disposition: The below labs and imaging reports reviewed and summarized above.  Medication management as above.    The patient was admitted with gross hematuria COVID-19.    He is starting to improve, however given his age, encephalopathy, comorbid coronary disease, it is my medical opinion that he should continue IV remdesivir for 5 days, prior to discharge.               DVT prophylaxis: SCDs Code Status: Full code Family Communication: Call daughter, no answer, left voicemail     Procedures:     Antimicrobials:      Culture data:   12/30 blood culture x2-no growth  12/30 urine culture-multiple species  1/1 urine-pending        Subjective: Patient appears very tired, listless, but he is oriented and interactive.  Oriented to hospital, situation.  Hematuria is still slightly present.  He is still cold.  No chest pain, sputum, vomiting, diarrhea.  Foley leaking overnight.      Objective: Vitals:   01/01/20 2059 01/02/20 0309 01/02/20 0800 01/02/20 1605  BP: (!) 146/63 (!) 149/63 (!) 155/69 (!) 146/62  Pulse: 67 68 74 74  Resp: 20 20 19 19   Temp: 98 F (36.7 C) 97.7 F (  36.5 C) 98.1 F (36.7 C)   TempSrc: Oral Oral Oral   SpO2: 92% 91% 92% 92%  Weight:  88.1 kg    Height:        Intake/Output Summary (Last 24 hours) at 01/02/2020 1655 Last data filed at 01/02/2020 1100 Gross per 24 hour  Intake  400 ml  Output 300 ml  Net 100 ml   Filed Weights   12/30/19 0832 01/02/20 0309  Weight: 95.3 kg 88.1 kg    Examination: General appearance: Elderly adult male, alert and in no acute distress.  Appears fatigued, lying in bed, slumped, listless. HEENT: Anicteric, conjunctiva pink, lids and lashes normal. No nasal deformity, discharge, epistaxis.  Lips moist, OP moist, no oral lesions.  Hearing normal. Skin: Warm and dry.  No suspicious rashes or lesions. Cardiac: RRR, no murmurs appreciated.  No LE edema.    Respiratory: Normal respiratory rate and rhythm.  CTAB without rales or wheezes.  Respiratory effort shallow, lung sounds diminished. Abdomen: Abdomen soft.  No tenderness palpation or guarding. No ascites, distension, hepatosplenomegaly.   MSK: No deformities or effusions of the large joints of the upper or lower extremities bilaterally. Neuro: Awake and alert. Naming is grossly intact, and the patient's recall, recent and remote, as well as general fund of knowledge seem within normal limits.  Muscle tone diminished, without fasciculations.  Moves all extremities equally but with severe generalized weakness and with normal coordination.  Marland Kitchen Speech fluent.    Psych: Sensorium intact and responding to questions, attention normal. Affect flat.  Judgment and insight appear normal.            Data Reviewed: I have personally reviewed following labs and imaging studies:  CBC: Recent Labs  Lab 12/30/19 0837 12/31/19 0539 01/01/20 0428 01/02/20 0554  WBC 6.9 4.1 8.5 11.9*  NEUTROABS 3.3 2.2 5.4 7.4  HGB 12.8* 11.5* 11.9* 13.0  HCT 38.1* 33.5* 33.4* 39.3  MCV 92.7 89.1 87.2 91.0  PLT 184 179 238 [id number]   Basic Metabolic Panel: Recent Labs  Lab 12/30/19 0837 12/31/19 0539 01/01/20 0428 01/02/20 0554  NA 134* 136 140 140  K 4.1 4.2 4.1 4.0  CL 103 107 111 111  CO2 21* 19* 21* 21*  GLUCOSE 133* 119* 128* 118*  BUN 17 24* 30* 26*  CREATININE 0.84 0.72 0.82 0.80   CALCIUM 7.7* 7.6* 8.1* 8.4*  MG  --  2.0 2.1 2.0  PHOS  --  3.4 2.3* 2.3*   GFR: Estimated Creatinine Clearance: 78.4 mL/min (by C-G formula based on SCr of 0.8 mg/dL). Liver Function Tests: Recent Labs  Lab 12/30/19 0837 12/31/19 0539 01/01/20 0428 01/02/20 0554  AST 24 23 24 27   ALT 22 20 19 24   ALKPHOS 53 50 42 52  BILITOT 0.8 0.8 0.6 0.8  PROT 6.8 6.3* 6.1* 6.6  ALBUMIN 2.6* 2.4* 2.4* 2.7*   No results for input(s): LIPASE, AMYLASE in the last 168 hours. No results for input(s): AMMONIA in the last 168 hours. Coagulation Profile: Recent Labs  Lab 12/30/19 0837  INR 1.7*   Cardiac Enzymes: No results for input(s): CKTOTAL, CKMB, CKMBINDEX, TROPONINI in the last 168 hours. BNP (last 3 results) No results for input(s): PROBNP in the last 8760 hours. HbA1C: No results for input(s): HGBA1C in the last 72 hours. CBG: No results for input(s): GLUCAP in the last 168 hours. Lipid Profile: No results for input(s): CHOL, HDL, LDLCALC, TRIG, CHOLHDL, LDLDIRECT in the last 72 hours. Thyroid Function Tests:  No results for input(s): TSH, T4TOTAL, FREET4, T3FREE, THYROIDAB in the last 72 hours. Anemia Panel: Recent Labs    01/01/20 0428 01/02/20 0554  FERRITIN 278 248   Urine analysis:    Component Value Date/Time   COLORURINE BROWN (A) 12/30/2019 1337   APPEARANCEUR CLOUDY (A) 12/30/2019 1337   LABSPEC 1.008 12/30/2019 1337   PHURINE 6.0 12/30/2019 1337   GLUCOSEU NEGATIVE 12/30/2019 1337   HGBUR LARGE (A) 12/30/2019 1337   BILIRUBINUR NEGATIVE 12/30/2019 1337   KETONESUR NEGATIVE 12/30/2019 1337   PROTEINUR 100 (A) 12/30/2019 1337   NITRITE POSITIVE (A) 12/30/2019 1337   LEUKOCYTESUR NEGATIVE 12/30/2019 1337   Sepsis Labs: @LABRCNTIP (procalcitonin:4,lacticacidven:4)  ) Recent Results (from the past 240 hour(s))  Urine Culture     Status: Abnormal   Collection Time: 12/30/19 12:19 PM   Specimen: Urine, Random  Result Value Ref Range Status   Specimen  Description   Final    URINE, RANDOM Performed at [facility], [address], [place], [place] [address]    Special Requests   Final    Normal Performed at [facility], [address] [place], [place] [address]    Culture MULTIPLE SPECIES PRESENT, SUGGEST RECOLLECTION (A)  Final   Report Status 12/31/2019 FINAL  Final  Culture, blood (Routine X 2) w Reflex to ID Panel     Status: None (Preliminary result)   Collection Time: 12/30/19  1:38 PM   Specimen: BLOOD  Result Value Ref Range Status   Specimen Description BLOOD BLOOD RIGHT ARM  Final   Special Requests   Final    BOTTLES DRAWN AEROBIC AND ANAEROBIC Blood Culture adequate volume   Culture   Final    NO GROWTH 3 DAYS Performed at [facility], [address] [place], [place] [address]    Report Status PENDING  Incomplete  Culture, blood (Routine X 2) w Reflex to ID Panel     Status: None (Preliminary result)   Collection Time: 12/30/19  1:38 PM   Specimen: BLOOD  Result Value Ref Range Status   Specimen Description BLOOD RIGHT ANTECUBITAL  Final   Special Requests   Final    BOTTLES DRAWN AEROBIC AND ANAEROBIC Blood Culture adequate volume   Culture   Final    NO GROWTH 3 DAYS Performed at [facility], [address], [place], [place] [address]    Report Status PENDING  Incomplete  MRSA PCR Screening     Status: Abnormal   Collection Time: 01/01/20  6:00 AM   Specimen: Nasal Mucosa; Nasopharyngeal  Result Value Ref Range Status   MRSA by PCR POSITIVE (A) NEGATIVE Final    Comment:        The GeneXpert MRSA Assay (FDA approved for NASAL specimens only), is one component of a comprehensive MRSA colonization surveillance program. It is not intended to diagnose MRSA infection nor to guide or monitor treatment for MRSA infections. RESULT CALLED TO, READ BACK BY AND VERIFIED WITH: BETH SMITH AT [id number] ON 01/01/2020 [place]. Performed at [facility], [address], [place], [place] [address]          Radiology Studies: No results found.      Scheduled Meds: . amiodarone  100 mg Oral Daily  . amLODipine  5 mg Oral Daily  . vitamin C  1,000 mg Oral TID  . Chlorhexidine Gluconate Cloth  6 each Topical Daily  . cholecalciferol  1,000 Units Oral Daily  . dexamethasone (DECADRON)  injection  6 mg Intravenous Q24H  . feeding supplement (ENSURE ENLIVE)  237 mL Oral TID BM  . finasteride  5 mg Oral Daily  . levETIRAcetam  250 mg Oral Daily  . levETIRAcetam  500 mg Oral QPM  . losartan  100 mg Oral Daily  . mouth rinse  15 mL Mouth Rinse BID  . metoprolol succinate  25 mg Oral Daily  . mupirocin ointment   Nasal BID  . sodium chloride flush  3 mL Intravenous Q12H  . tamsulosin  0.4 mg Oral Daily  . zinc sulfate  220 mg Oral Daily   Continuous Infusions: . sodium chloride    . remdesivir 100 mg in NS 100 mL Stopped (01/02/20 1037)  . sodium chloride irrigation       LOS: 3 days    Time spent: 25 minutes      Edwin Dada, MD [facility] 01/02/2020, 4:55 PM     Please page through [place]:  [email or web address] Contact charge nurse for password If 7PM-7AM, please contact night-coverage

## 2020-01-02 NOTE — Progress Notes (Addendum)
Foley catheter noted to be leaking significantly, causing excessive moisture on skin in perineal area. Catheter continues to drain well; pt bladder scanned and no urine found to be in bladder. Balloon checked; 30 cc found to be in balloon; reinflated. Foley care and peri care done; barrier cream and antifungal powder applied to perineal area. Sacral foam removed, as it is loose and wet. Will reapply foam at a later time. Right buttock has 1 cm area of possible shearing vs MASD. Barrier cream applied to this area as well.

## 2020-01-02 NOTE — Progress Notes (Signed)
Got from report that patient is supposed to be DNR, he has a DNR form in his hard chart from his facility. Called patient's daughter and confirm if this is what patient's wants since he is confused and per daughter his dad wants to be DNR, inform MD to change code status in EMS.

## 2020-01-02 NOTE — TOC Progression Note (Signed)
Transition of Care [facility]) - Progression Note    Patient Details  Name: Jimmy Moore MRN: [id number] Date of Birth: 12-27-1929  [facility] CM/SW Contact  Cecil Cobbs Phone Number: 01/02/2020, 10:45 AM  Clinical Narrative:     CSW spoke to Seth Bake at [facility] to inform her that he should be able to return to SNF tomorrow.  CSW spoke to [place] in admissions and she said they can accept him back tomorrow.  CSW to continue to follow patient's progress throughout discharge planning.   Expected Discharge Plan: [place] Barriers to Discharge: Continued Medical Work up  Expected Discharge Plan and Services Expected Discharge Plan: [place] Choice: [place] arrangements for the past 2 months: [place], Single Family Home                 DME Arranged: N/A         HH Arranged: NA           Social Determinants of Health (SDOH) Interventions    Readmission Risk Interventions No flowsheet data found.

## 2020-01-02 NOTE — Progress Notes (Signed)
Discussed with Rachael Fee, APP on call regarding leaking foley. No orders received; given that foley is large, 22 Fr, increasing size presents increased bleeding risk. Will CTM and provide protective skin care.

## 2020-01-03 DIAGNOSIS — L8990 Pressure ulcer of unspecified site, unspecified stage: Secondary | ICD-10-CM

## 2020-01-03 DIAGNOSIS — F0390 Unspecified dementia without behavioral disturbance: Secondary | ICD-10-CM

## 2020-01-03 DIAGNOSIS — E785 Hyperlipidemia, unspecified: Secondary | ICD-10-CM

## 2020-01-03 DIAGNOSIS — I739 Peripheral vascular disease, unspecified: Secondary | ICD-10-CM

## 2020-01-03 LAB — COMPREHENSIVE METABOLIC PANEL
ALT: 22 U/L (ref 0–44)
AST: 22 U/L (ref 15–41)
Albumin: 2.5 g/dL — ABNORMAL LOW (ref 3.5–5.0)
Alkaline Phosphatase: 49 U/L (ref 38–126)
Anion gap: 7 (ref 5–15)
BUN: 23 mg/dL (ref 8–23)
CO2: 22 mmol/L (ref 22–32)
Calcium: 8.3 mg/dL — ABNORMAL LOW (ref 8.9–10.3)
Chloride: 110 mmol/L (ref 98–111)
Creatinine, Ser: 0.66 mg/dL (ref 0.61–1.24)
GFR calc Af Amer: >60 mL/min (ref >60)
GFR calc non Af Amer: >60 mL/min (ref >60)
Glucose, Bld: 121 mg/dL — ABNORMAL HIGH (ref 70–99)
Potassium: 4 mmol/L (ref 3.5–5.1)
Sodium: 139 mmol/L (ref 135–145)
Total Bilirubin: 0.8 mg/dL (ref 0.3–1.2)
Total Protein: 6.3 g/dL — ABNORMAL LOW (ref 6.5–8.1)

## 2020-01-03 LAB — D-DIMER, QUANTITATIVE: D-Dimer, Quant: 3.59 ug/mL-FEU — ABNORMAL HIGH (ref 0.00–0.50)

## 2020-01-03 LAB — CBC WITH DIFFERENTIAL/PLATELET
Abs Immature Granulocytes: 1.03 10*3/uL — ABNORMAL HIGH (ref 0.00–0.07)
Basophils Absolute: 0.1 10*3/uL (ref 0.0–0.1)
Basophils Relative: 1 %
Eosinophils Absolute: 0 10*3/uL (ref 0.0–0.5)
Eosinophils Relative: 0 %
HCT: 35.5 % — ABNORMAL LOW (ref 39.0–52.0)
Hemoglobin: 12.7 g/dL — ABNORMAL LOW (ref 13.0–17.0)
Immature Granulocytes: 9 %
Lymphocytes Relative: 6 %
Lymphs Abs: 0.7 10*3/uL (ref 0.7–4.0)
MCH: 31 pg (ref 26.0–34.0)
MCHC: 35.8 g/dL (ref 30.0–36.0)
MCV: 86.6 fL (ref 80.0–100.0)
Monocytes Absolute: 2 10*3/uL — ABNORMAL HIGH (ref 0.1–1.0)
Monocytes Relative: 17 %
Neutro Abs: 8.2 10*3/uL — ABNORMAL HIGH (ref 1.7–7.7)
Neutrophils Relative %: 67 %
Platelets: 287 10*3/uL (ref 150–400)
RBC: 4.1 MIL/uL — ABNORMAL LOW (ref 4.22–5.81)
RDW: 13.2 % (ref 11.5–15.5)
Smear Review: NORMAL
WBC: 12.1 10*3/uL — ABNORMAL HIGH (ref 4.0–10.5)
nRBC: 0 % (ref 0.0–0.2)

## 2020-01-03 LAB — MAGNESIUM: Magnesium: 1.8 mg/dL (ref 1.7–2.4)

## 2020-01-03 LAB — FERRITIN: Ferritin: 256 ng/mL (ref 24–336)

## 2020-01-03 LAB — C-REACTIVE PROTEIN: CRP: 3.6 mg/dL — ABNORMAL HIGH (ref <1.0)

## 2020-01-03 LAB — PHOSPHORUS: Phosphorus: 2.4 mg/dL — ABNORMAL LOW (ref 2.5–4.6)

## 2020-01-03 MED ORDER — DEXAMETHASONE 2 MG PO TABS: ORAL_TABLET | ORAL | 0 refills | Status: DC

## 2020-01-03 MED ORDER — TRAMADOL HCL 50 MG PO TABS: 50.0000 mg | ORAL_TABLET | Freq: Four times a day (QID) | ORAL | 0 refills | Status: AC | PRN

## 2020-01-03 MED ORDER — FINASTERIDE 5 MG PO TABS: 5.0000 mg | ORAL_TABLET | Freq: Every day | ORAL | Status: AC

## 2020-01-03 MED ORDER — ENSURE ENLIVE PO LIQD: 237.0000 mL | Freq: Three times a day (TID) | ORAL | 12 refills | Status: AC

## 2020-01-03 MED ORDER — CEPHALEXIN 500 MG PO CAPS
500.0000 mg | ORAL_CAPSULE | Freq: Three times a day (TID) | ORAL | Status: DC
  Administered 2020-01-03: 10:00:00 500 mg via ORAL

## 2020-01-03 MED ORDER — CEPHALEXIN 500 MG PO CAPS: 500.0000 mg | ORAL_CAPSULE | Freq: Three times a day (TID) | ORAL | Status: DC

## 2020-01-03 NOTE — TOC Transition Note (Signed)
Transition of Care [facility]) - CM/SW Discharge Note   Patient Details  Name: Jimmy Moore MRN: [id number] Date of Birth: [birth year]  Transition of Care [facility] CM/SW Contact:  Trecia Rogers, LCSW Phone Number: 01/03/2020, 9:43 AM   Clinical Narrative:     CSW called Seth Bake at [facility] to let her know that the patient will be discharging today. Seth Bake stated that the patient has a bed awaiting at [facility] and that he can come. Seth Bake gave the number for report.   CSW contacted pt's daughter who agreed with the patient going to [facility] and stated that she was concerned about his confused state but that she talked with the doctor.   CSW filled out the medical necessity form. CSW printed out face sheet and medical necessity form. CSW obtained the pt's signed DNR and placed all in his chart.  CSW informed the pt's nurse and gave her the number for report. Pt's nurse stated that she has to give him additional medications before he can leave.   No other needs.   Final next level of care: Skilled Nursing Facility Barriers to Discharge: No Barriers Identified   Patient Goals and CMS Choice Patient states their goals for this hospitalization and ongoing recovery are:: To return back to [facility] short term rehab. CMS Medicare.gov Compare Post Acute Care list provided to:: Patient Choice offered to / list presented to : [place] / [place]  Discharge Placement              Patient chooses bed at: [facility] Patient to be transferred to facility by: [facility] Name of family member notified: Pt's daughter Patient and family notified of of transfer: 01/03/20  Discharge Plan and Services     Post Acute Care Choice: [place]          DME Arranged: N/A         HH Arranged: NA          Social Determinants of Health (SDOH) Interventions     Readmission Risk Interventions No flowsheet data found.

## 2020-01-03 NOTE — Discharge Summary (Addendum)
Physician Discharge Summary  Jimmy Moore [id number] DOB: 1929/09/01 DOA: 12/30/2019  PCP: Jimmy Carbon, MD  Admit date: 12/30/2019 Discharge date: 01/03/2020  Admitted From: SNF rehab  Disposition:  SNF rehab   Recommendations for Outpatient Follow-up:  1. Please monitor urine output: if not increasingly bloody in 2 days, restart Eliquis 5 mg BID 2. Please schedule Urology follow up with Jimmy Moore as soon as able 3. Please obtain CBC in 1 week 4. Please provide Ensure nutrition supplement three times daily for next month, and for next two weeks, make sure patient drinks five additional 8oz glasses water per day  5. Please follow up urine culture from 1/3 at [facility] and stop antibiotics if no growth     Home Health: N/A  Equipment/Devices: TBD at SNF  Discharge Condition: Fair  CODE STATUS: DO NOT RESUSCITATE Diet recommendation: Heart healthy  Brief/Interim Summary: Jimmy Moore is a 86 y.o. M with dementia, CAD, dCHF, HTN, PVD, and Afib on Eliquis who presented with materia.  In the ER, he had some hematuria, but this cleared with bladder irrigation and a one-time dose of Tranexamic acid.    Incidentally, while in the ER, he desaturated to 87% and developed a fever.  COVID testing was positive and chest x-ray showed patchy airspace opacity, and so he was admitted for coronavirus.      PRINCIPAL HOSPITAL DIAGNOSIS: COVID-19    Discharge Diagnoses:   Coronavirus pneumonitis with hypoxemia Patient presented with SARS-CoV-2 infection, hypoxia and pneumonia on CXR in the setting of the ongoing 2020 COVID-19 pandemic.  He was started on remdesivir and steroids, weaned to room air, respiratory effort improved.   No further fever.  Monitor O2 daily for 2 weeks.  Ensure adequate fluid intake.   Acute metabolic encephalopathy in the setting of dementia Encephalopathy from COVID-19, resolved.  Gross hematuria This was the initial presenting complaint, but was  an ongoing problem, for which patient had recently undergone cystoscopy within the last 2 weeks, without findings.  He does have a left renal mass, suspected to be renal cell carcinoma.  Urine culture with multiple species, but patient without fever or dysuria or suprapubic pain and remained stable off antibiotics.  Given indwelling foley and leukocytosis (likely from steroids), will treat empirically for now.  Eliquis was held and urine cleared to pink tinged at discharge.  I discussed with urology, on-call physician for Jimmy Moore, patient's primary urologist.  They noted that it was unlikely for RCC of that size to cause bleeding, more likely this was prostatic in origin.  They recommended starting finasteride, restarting Eliquis cautiously when bleeding improves, trending hemoglobin, and close urology follow-up after Covid Moore over. -Start finasteride -Close Urology follow up when Jimmy Moore over. -Cephalexin and follow urine culture  Coronary disease, secondary prevention Hypertension Chronic diastolic CHF  Atrial fibrillation, chronic  BPH  GERD  Seizure disorder   Anemia, mild acute blood loss Hemoglobin stable here.  High risk of malnutrition He has moderate muscle mass and fat loss.  Stage II pressure injury buttocks, POA        Discharge Instructions  Discharge Instructions    Diet - low sodium heart healthy   Complete by: As directed    Discharge instructions   Complete by: As directed    From Dr. Loleta Books: You were sent to the ER for blood in the urine.  This was a benign and not dangerous finding, but you were noticed by accident also to have COVID and to  need oxygen.  You were admitted for coronavirus (Also known as COVID-19)  You were treated with an anti-virus medicine ("remdesivir") and an anti-inflammatory (a "steroid") while you were here.  You completed the course of the anti-virus medicine, remdesivir  You should finish the  course of steroids by taking dexamethasone once daily for 3 more days Take dexamethasone 6 mg (3 tabs) for 1 day then take dexamethasone 4 mg (2 tabs) for 1 day then take dexamethasone 2 mg (1 tab) for 1 day then stop Check your oxygen level once daily or per nursing home protocol  Make SURE you drink at least five 8 oz glasses of water per day  In addition, please drink 3 Ensure per day for the next month, then reduce to once daily  For the blood in urine: Continue Flomax Continue the new finasteride/Proscar  Monitor the urine color. In 2 days, if urine is not increasingly bloody, resume Eliquis 5 mg BID  Call Jimmy Moore office for a follow up Urology appointment as soon as able  Take cephalexin 500 mg three times daily for 5 days Follow up urine culture drawn 1/3 at [facility] and stop antibiotics if negative for growth   Increase activity slowly   Complete by: As directed      Allergies as of 01/03/2020   No Known Allergies     Medication List    STOP taking these medications   apixaban 5 MG Tabs tablet Commonly known as: ELIQUIS     TAKE these medications   acetaminophen 325 MG tablet Commonly known as: TYLENOL Take 650 mg by mouth every 4 (four) hours as needed for mild pain or fever.   albuterol 108 (90 Base) MCG/ACT inhaler Commonly known as: VENTOLIN HFA Inhale 1 puff into the lungs every 12 (twelve) hours as needed for wheezing or shortness of breath.   amiodarone 100 MG tablet Commonly known as: PACERONE Take 100 mg by mouth daily.   amLODipine 5 MG tablet Commonly known as: NORVASC Take 5 mg by mouth daily.   cephALEXin 500 MG capsule Commonly known as: KEFLEX Take 1 capsule (500 mg total) by mouth every 8 (eight) hours for 5 days.   dexamethasone 2 MG tablet Commonly known as: DECADRON Take 6 mg for 1 day then take 4 mg for 1 day then take 2 mg for 1 day then stop   feeding supplement (ENSURE ENLIVE) Liqd Take 237 mLs by mouth 3 (three) times daily  between meals.   finasteride 5 MG tablet Commonly known as: PROSCAR Take 1 tablet (5 mg total) by mouth daily.   gabapentin 100 MG capsule Commonly known as: NEURONTIN Take 100 mg by mouth 3 (three) times daily.   levETIRAcetam 250 MG tablet Commonly known as: KEPPRA Take 500 mg by mouth every evening.   levETIRAcetam 250 MG tablet Commonly known as: KEPPRA Take 250 mg by mouth daily.   losartan 100 MG tablet Commonly known as: COZAAR Take 100 mg by mouth daily.   metoprolol succinate 25 MG 24 hr tablet Commonly known as: TOPROL-XL Take 25 mg by mouth daily.   omeprazole 20 MG capsule Commonly known as: PRILOSEC Take 20 mg by mouth at bedtime.   tamsulosin 0.4 MG Caps capsule Commonly known as: FLOMAX Take 0.4 mg by mouth daily.   traMADol 50 MG tablet Commonly known as: ULTRAM Take 1 tablet (50 mg total) by mouth every 6 (six) hours as needed for moderate pain.   Tussin DM 10-100 MG/5ML liquid  Generic drug: dextromethorphan-guaiFENesin Take 10 mLs by mouth every 4 (four) hours as needed for cough.      Follow-up Information    Schedule an appointment as soon as possible for a visit  with Jimmy Carbon, MD.   Specialties: Internal Medicine, Pediatrics Why: For recheck-Hold Eliquis until hematuria resolves Contact information: [place] [address] [phone]        Rodney Cruise, MD. Schedule an appointment as soon as possible for a visit in 2 week(s).   Specialty: Urology Contact information: [place] [address] [phone]          No Known Allergies  Consultations:  Urology by phone   Procedures/Studies: DG Chest 1 View  Result Date: 12/30/2019 CLINICAL DATA:  Dyspnea EXAM: CHEST  1 VIEW COMPARISON:  07/21/2013 FINDINGS: Cardiac shadow is stable. Postsurgical changes are again noted. Lungs are well aerated bilaterally. Chronic blunting of the left costophrenic angle is noted.  Some patchy airspace opacity is noted in the right mid lung laterally which may represent some early infiltrate. No other focal abnormality is noted. IMPRESSION: Patchy airspace opacity in the right mid lung. Electronically Signed   By: Inez Catalina M.D.   On: 12/30/2019 12:57      Subjective: Feling well.  Urine more clear, still pink tinged.  No suprapubic pain, no fever, no vomiting, no flank pain, no dysuria.  No cough or respireatory distress.  Discharge Exam: Vitals:   01/03/20 0429 01/03/20 0821  BP: (!) 164/69 (!) 152/76  Pulse: 66 78  Resp: 18 19  Temp: 97.9 F (36.6 C) 97.6 F (36.4 C)  SpO2: 94% 93%   Vitals:   01/02/20 2107 01/03/20 0017 01/03/20 0429 01/03/20 0821  BP: (!) 157/68  (!) 164/69 (!) 152/76  Pulse: 68  66 78  Resp: 17  18 19   Temp: (!) 96.8 F (36 C)  97.9 F (36.6 C) 97.6 F (36.4 C)  TempSrc: Axillary  Axillary Oral  SpO2: 92%  94% 93%  Weight:  87.9 kg    Height:        General: Pt is alert, awake, not in acute distress Cardiovascular: RRR, nl S1-S2, no murmurs appreciated.   No LE edema.   Respiratory: Normal respiratory rate and rhythm.  CTAB without rales or wheezes. Abdominal: Abdomen soft and non-tender.  No distension or HSM.   Neuro/Psych: Strength symmetric in upper and lower extremities.  Judgment and insight appear normal, generalized weakness, diffuse loss of muscle mass.   The results of significant diagnostics from this hospitalization (including imaging, microbiology, ancillary and laboratory) are listed below for reference.     Microbiology: Recent Results (from the past 240 hour(s))  Urine Culture     Status: Abnormal   Collection Time: 12/30/19 12:19 PM   Specimen: Urine, Random  Result Value Ref Range Status   Specimen Description   Final    URINE, RANDOM Performed at [facility], [address] [place], [place] [address]    Special Requests   Final    Normal Performed at [facility], [address] [place], [place] [address]    Culture MULTIPLE SPECIES PRESENT, SUGGEST RECOLLECTION (A)  Final   Report Status 12/31/2019 FINAL  Final  Culture, blood (Routine X 2) w Reflex to ID Panel     Status: None (Preliminary result)   Collection Time: 12/30/19  1:38 PM   Specimen: BLOOD  Result Value  Ref Range Status   Specimen Description BLOOD BLOOD RIGHT ARM  Final   Special Requests   Final    BOTTLES DRAWN AEROBIC AND ANAEROBIC Blood Culture adequate volume   Culture   Final    NO GROWTH 4 DAYS Performed at [facility], [address], [place], [place] [address]    Report Status PENDING  Incomplete  Culture, blood (Routine X 2) w Reflex to ID Panel     Status: None (Preliminary result)   Collection Time: 12/30/19  1:38 PM   Specimen: BLOOD  Result Value Ref Range Status   Specimen Description BLOOD RIGHT ANTECUBITAL  Final   Special Requests   Final    BOTTLES DRAWN AEROBIC AND ANAEROBIC Blood Culture adequate volume   Culture   Final    NO GROWTH 4 DAYS Performed at [facility], [address] [place], [place] [address]    Report Status PENDING  Incomplete  MRSA PCR Screening     Status: Abnormal   Collection Time: 01/01/20  6:00 AM   Specimen: Nasal Mucosa; Nasopharyngeal  Result Value Ref Range Status   MRSA by PCR POSITIVE (A) NEGATIVE Final    Comment:        The GeneXpert MRSA Assay (FDA approved for NASAL specimens only), is one component of a comprehensive MRSA colonization surveillance program. It is not intended to diagnose MRSA infection nor to guide or monitor treatment for MRSA infections. RESULT CALLED TO, READ BACK BY AND VERIFIED WITH: BETH SMITH AT [id number] ON 01/01/2020 [place]. Performed at [facility], [address] [place], [place] [address]      Labs: BNP (last 3 results) No results for input(s): BNP in the last 8760 hours. Basic Metabolic Panel: Recent Labs  Lab 12/30/19 0837 12/31/19 0539 01/01/20 0428  01/02/20 0554 01/03/20 0746  NA 134* 136 140 140 139  K 4.1 4.2 4.1 4.0 4.0  CL 103 107 111 111 110  CO2 21* 19* 21* 21* 22  GLUCOSE 133* 119* 128* 118* 121*  BUN 17 24* 30* 26* 23  CREATININE 0.84 0.72 0.82 0.80 0.66  CALCIUM 7.7* 7.6* 8.1* 8.4* 8.3*  MG  --  2.0 2.1 2.0 1.8  PHOS  --  3.4 2.3* 2.3* 2.4*   Liver Function Tests: Recent Labs  Lab 12/30/19 0837 12/31/19 0539 01/01/20 0428 01/02/20 0554 01/03/20 0746  AST 24 23 24 27 22   ALT 22 20 19 24 22   ALKPHOS 53 50 42 52 49  BILITOT 0.8 0.8 0.6 0.8 0.8  PROT 6.8 6.3* 6.1* 6.6 6.3*  ALBUMIN 2.6* 2.4* 2.4* 2.7* 2.5*   No results for input(s): LIPASE, AMYLASE in the last 168 hours. No results for input(s): AMMONIA in the last 168 hours. CBC: Recent Labs  Lab 12/30/19 0837 12/31/19 0539 01/01/20 0428 01/02/20 0554 01/03/20 0746  WBC 6.9 4.1 8.5 11.9* 12.1*  NEUTROABS 3.3 2.2 5.4 7.4 PENDING  HGB 12.8* 11.5* 11.9* 13.0 12.7*  HCT 38.1* 33.5* 33.4* 39.3 35.5*  MCV 92.7 89.1 87.2 91.0 86.6  PLT 184 179 238 322 287   Cardiac Enzymes: No results for input(s): CKTOTAL, CKMB, CKMBINDEX, TROPONINI in the last 168 hours. BNP: Invalid input(s): POCBNP CBG: No results for input(s): GLUCAP in the last 168 hours. D-Dimer No results for input(s): DDIMER in the last 72 hours. Hgb A1c No results for input(s): HGBA1C in the last 72 hours. Lipid Profile No results for input(s): CHOL, HDL, LDLCALC, TRIG, CHOLHDL, LDLDIRECT in the  last 72 hours. Thyroid function studies No results for input(s): TSH, T4TOTAL, T3FREE, THYROIDAB in the last 72 hours.  Invalid input(s): FREET3 Anemia work up Recent Labs    01/02/20 0554 01/03/20 0746  FERRITIN 248 256   Urinalysis    Component Value Date/Time   COLORURINE BROWN (A) 12/30/2019 1337   APPEARANCEUR CLOUDY (A) 12/30/2019 1337   LABSPEC 1.008 12/30/2019 1337   PHURINE 6.0 12/30/2019 1337   GLUCOSEU NEGATIVE 12/30/2019 1337   HGBUR LARGE (A) 12/30/2019 1337    BILIRUBINUR NEGATIVE 12/30/2019 1337   [place] 12/30/2019 1337   PROTEINUR 100 (A) 12/30/2019 1337   NITRITE POSITIVE (A) 12/30/2019 1337   LEUKOCYTESUR NEGATIVE 12/30/2019 1337   Sepsis Labs Invalid input(s): PROCALCITONIN,  WBC,  LACTICIDVEN Microbiology Recent Results (from the past 240 hour(s))  Urine Culture     Status: Abnormal   Collection Time: 12/30/19 12:19 PM   Specimen: Urine, Random  Result Value Ref Range Status   Specimen Description   Final    URINE, RANDOM Performed at [facility], [address], [place], [place] [address]    Special Requests   Final    Normal Performed at [facility], [address] [place], [place] [address]    Culture MULTIPLE SPECIES PRESENT, SUGGEST RECOLLECTION (A)  Final   Report Status 12/31/2019 FINAL  Final  Culture, blood (Routine X 2) w Reflex to ID Panel     Status: None (Preliminary result)   Collection Time: 12/30/19  1:38 PM   Specimen: BLOOD  Result Value Ref Range Status   Specimen Description BLOOD BLOOD RIGHT ARM  Final   Special Requests   Final    BOTTLES DRAWN AEROBIC AND ANAEROBIC Blood Culture adequate volume   Culture   Final    NO GROWTH 4 DAYS Performed at [facility], [address], [place], [place] [address]    Report Status PENDING  Incomplete  Culture, blood (Routine X 2) w Reflex to ID Panel     Status: None (Preliminary result)   Collection Time: 12/30/19  1:38 PM   Specimen: BLOOD  Result Value Ref Range Status   Specimen Description BLOOD RIGHT ANTECUBITAL  Final   Special Requests   Final    BOTTLES DRAWN AEROBIC AND ANAEROBIC Blood Culture adequate volume   Culture   Final    NO GROWTH 4 DAYS Performed at [facility], [address], [place], [place] [address]    Report Status PENDING  Incomplete  MRSA PCR Screening     Status: Abnormal   Collection Time: 01/01/20  6:00 AM   Specimen: Nasal Mucosa; Nasopharyngeal  Result Value Ref Range  Status   MRSA by PCR POSITIVE (A) NEGATIVE Final    Comment:        The GeneXpert MRSA Assay (FDA approved for NASAL specimens only), is one component of a comprehensive MRSA colonization surveillance program. It is not intended to diagnose MRSA infection nor to guide or monitor treatment for MRSA infections. RESULT CALLED TO, READ BACK BY AND VERIFIED WITH: BETH SMITH AT [id number] ON 01/01/2020 [place]. Performed at [facility], [address] [place], [place] [address]      Time coordinating discharge: 35 minutes The [place] controlled substances registry was reviewed for this patient prior to filling the <5 days supply controlled substances script.      SIGNED:   Edwin Dada, MD  [facility] 01/03/2020, 9:11 AM

## 2020-01-03 NOTE — Progress Notes (Signed)
Gave report to Suzette Battiest, RN receiving patient at [facility]. No questions at this time. This RN called for EMS for transport.

## 2020-01-04 LAB — CULTURE, BLOOD (ROUTINE X 2)
Culture: NO GROWTH
Culture: NO GROWTH
Special Requests: ADEQUATE
Special Requests: ADEQUATE

## 2020-01-05 DIAGNOSIS — R319 Hematuria, unspecified: Secondary | ICD-10-CM | POA: Diagnosis not present

## 2020-01-05 DIAGNOSIS — N390 Urinary tract infection, site not specified: Secondary | ICD-10-CM | POA: Diagnosis not present

## 2020-01-05 LAB — URINE CULTURE: Culture: 70000 — AB

## 2020-01-08 ENCOUNTER — Encounter: Payer: Self-pay | Admitting: Emergency Medicine

## 2020-01-08 ENCOUNTER — Other Ambulatory Visit: Payer: Self-pay

## 2020-01-08 ENCOUNTER — Inpatient Hospital Stay
Admission: EM | Admit: 2020-01-08 | Discharge: 2020-02-01 | DRG: 862 | Disposition: E | Payer: Medicare Other | Attending: Internal Medicine | Admitting: Internal Medicine

## 2020-01-08 ENCOUNTER — Inpatient Hospital Stay: Payer: Medicare Other

## 2020-01-08 ENCOUNTER — Emergency Department: Payer: Medicare Other

## 2020-01-08 DIAGNOSIS — N3081 Other cystitis with hematuria: Secondary | ICD-10-CM | POA: Diagnosis present

## 2020-01-08 DIAGNOSIS — I2510 Atherosclerotic heart disease of native coronary artery without angina pectoris: Secondary | ICD-10-CM | POA: Diagnosis not present

## 2020-01-08 DIAGNOSIS — E872 Acidosis: Secondary | ICD-10-CM | POA: Diagnosis present

## 2020-01-08 DIAGNOSIS — D509 Iron deficiency anemia, unspecified: Secondary | ICD-10-CM | POA: Diagnosis present

## 2020-01-08 DIAGNOSIS — J9601 Acute respiratory failure with hypoxia: Secondary | ICD-10-CM

## 2020-01-08 DIAGNOSIS — Z86010 Personal history of colonic polyps: Secondary | ICD-10-CM

## 2020-01-08 DIAGNOSIS — E440 Moderate protein-calorie malnutrition: Secondary | ICD-10-CM | POA: Diagnosis present

## 2020-01-08 DIAGNOSIS — K219 Gastro-esophageal reflux disease without esophagitis: Secondary | ICD-10-CM | POA: Diagnosis present

## 2020-01-08 DIAGNOSIS — U071 COVID-19: Secondary | ICD-10-CM | POA: Diagnosis present

## 2020-01-08 DIAGNOSIS — N401 Enlarged prostate with lower urinary tract symptoms: Secondary | ICD-10-CM | POA: Diagnosis present

## 2020-01-08 DIAGNOSIS — I482 Chronic atrial fibrillation, unspecified: Secondary | ICD-10-CM | POA: Diagnosis present

## 2020-01-08 DIAGNOSIS — N3090 Cystitis, unspecified without hematuria: Secondary | ICD-10-CM | POA: Diagnosis not present

## 2020-01-08 DIAGNOSIS — Z79890 Hormone replacement therapy (postmenopausal): Secondary | ICD-10-CM

## 2020-01-08 DIAGNOSIS — I4892 Unspecified atrial flutter: Secondary | ICD-10-CM | POA: Diagnosis not present

## 2020-01-08 DIAGNOSIS — Z7901 Long term (current) use of anticoagulants: Secondary | ICD-10-CM | POA: Diagnosis not present

## 2020-01-08 DIAGNOSIS — T462X5A Adverse effect of other antidysrhythmic drugs, initial encounter: Secondary | ICD-10-CM | POA: Diagnosis not present

## 2020-01-08 DIAGNOSIS — R338 Other retention of urine: Secondary | ICD-10-CM | POA: Diagnosis not present

## 2020-01-08 DIAGNOSIS — K651 Peritoneal abscess: Secondary | ICD-10-CM | POA: Diagnosis present

## 2020-01-08 DIAGNOSIS — G40909 Epilepsy, unspecified, not intractable, without status epilepticus: Secondary | ICD-10-CM | POA: Diagnosis present

## 2020-01-08 DIAGNOSIS — J704 Drug-induced interstitial lung disorders, unspecified: Secondary | ICD-10-CM | POA: Diagnosis not present

## 2020-01-08 DIAGNOSIS — T83021A Displacement of indwelling urethral catheter, initial encounter: Secondary | ICD-10-CM | POA: Diagnosis present

## 2020-01-08 DIAGNOSIS — E785 Hyperlipidemia, unspecified: Secondary | ICD-10-CM | POA: Diagnosis not present

## 2020-01-08 DIAGNOSIS — D72829 Elevated white blood cell count, unspecified: Secondary | ICD-10-CM | POA: Diagnosis not present

## 2020-01-08 DIAGNOSIS — J9621 Acute and chronic respiratory failure with hypoxia: Secondary | ICD-10-CM | POA: Diagnosis not present

## 2020-01-08 DIAGNOSIS — Z66 Do not resuscitate: Secondary | ICD-10-CM | POA: Diagnosis present

## 2020-01-08 DIAGNOSIS — I5032 Chronic diastolic (congestive) heart failure: Secondary | ICD-10-CM | POA: Diagnosis present

## 2020-01-08 DIAGNOSIS — Z8616 Personal history of COVID-19: Secondary | ICD-10-CM | POA: Diagnosis not present

## 2020-01-08 DIAGNOSIS — G9341 Metabolic encephalopathy: Secondary | ICD-10-CM | POA: Diagnosis not present

## 2020-01-08 DIAGNOSIS — I4510 Unspecified right bundle-branch block: Secondary | ICD-10-CM | POA: Diagnosis present

## 2020-01-08 DIAGNOSIS — Y738 Miscellaneous gastroenterology and urology devices associated with adverse incidents, not elsewhere classified: Secondary | ICD-10-CM | POA: Diagnosis present

## 2020-01-08 DIAGNOSIS — R188 Other ascites: Secondary | ICD-10-CM | POA: Diagnosis not present

## 2020-01-08 DIAGNOSIS — R451 Restlessness and agitation: Secondary | ICD-10-CM

## 2020-01-08 DIAGNOSIS — R319 Hematuria, unspecified: Secondary | ICD-10-CM | POA: Diagnosis not present

## 2020-01-08 DIAGNOSIS — I739 Peripheral vascular disease, unspecified: Secondary | ICD-10-CM | POA: Diagnosis present

## 2020-01-08 DIAGNOSIS — Z9079 Acquired absence of other genital organ(s): Secondary | ICD-10-CM | POA: Diagnosis not present

## 2020-01-08 DIAGNOSIS — Z8249 Family history of ischemic heart disease and other diseases of the circulatory system: Secondary | ICD-10-CM

## 2020-01-08 DIAGNOSIS — N3091 Cystitis, unspecified with hematuria: Secondary | ICD-10-CM | POA: Diagnosis not present

## 2020-01-08 DIAGNOSIS — Z951 Presence of aortocoronary bypass graft: Secondary | ICD-10-CM

## 2020-01-08 DIAGNOSIS — F0390 Unspecified dementia without behavioral disturbance: Secondary | ICD-10-CM | POA: Diagnosis present

## 2020-01-08 DIAGNOSIS — Z978 Presence of other specified devices: Secondary | ICD-10-CM | POA: Diagnosis not present

## 2020-01-08 DIAGNOSIS — B9562 Methicillin resistant Staphylococcus aureus infection as the cause of diseases classified elsewhere: Secondary | ICD-10-CM | POA: Diagnosis not present

## 2020-01-08 DIAGNOSIS — N412 Abscess of prostate: Secondary | ICD-10-CM | POA: Diagnosis present

## 2020-01-08 DIAGNOSIS — Z515 Encounter for palliative care: Secondary | ICD-10-CM | POA: Diagnosis not present

## 2020-01-08 DIAGNOSIS — Z8701 Personal history of pneumonia (recurrent): Secondary | ICD-10-CM

## 2020-01-08 DIAGNOSIS — N322 Vesical fistula, not elsewhere classified: Secondary | ICD-10-CM | POA: Diagnosis present

## 2020-01-08 DIAGNOSIS — I110 Hypertensive heart disease with heart failure: Secondary | ICD-10-CM | POA: Diagnosis present

## 2020-01-08 DIAGNOSIS — I4811 Longstanding persistent atrial fibrillation: Secondary | ICD-10-CM | POA: Diagnosis not present

## 2020-01-08 DIAGNOSIS — Z9849 Cataract extraction status, unspecified eye: Secondary | ICD-10-CM | POA: Diagnosis not present

## 2020-01-08 DIAGNOSIS — I10 Essential (primary) hypertension: Secondary | ICD-10-CM | POA: Diagnosis not present

## 2020-01-08 DIAGNOSIS — G9340 Encephalopathy, unspecified: Secondary | ICD-10-CM | POA: Diagnosis present

## 2020-01-08 DIAGNOSIS — E876 Hypokalemia: Secondary | ICD-10-CM | POA: Diagnosis present

## 2020-01-08 DIAGNOSIS — B965 Pseudomonas (aeruginosa) (mallei) (pseudomallei) as the cause of diseases classified elsewhere: Secondary | ICD-10-CM | POA: Diagnosis present

## 2020-01-08 DIAGNOSIS — E870 Hyperosmolality and hypernatremia: Secondary | ICD-10-CM | POA: Diagnosis present

## 2020-01-08 DIAGNOSIS — K5720 Diverticulitis of large intestine with perforation and abscess without bleeding: Secondary | ICD-10-CM | POA: Diagnosis present

## 2020-01-08 DIAGNOSIS — Z9981 Dependence on supplemental oxygen: Secondary | ICD-10-CM

## 2020-01-08 DIAGNOSIS — Z6823 Body mass index (BMI) 23.0-23.9, adult: Secondary | ICD-10-CM

## 2020-01-08 DIAGNOSIS — T8143XA Infection following a procedure, organ and space surgical site, initial encounter: Principal | ICD-10-CM

## 2020-01-08 DIAGNOSIS — I959 Hypotension, unspecified: Secondary | ICD-10-CM | POA: Diagnosis not present

## 2020-01-08 DIAGNOSIS — I480 Paroxysmal atrial fibrillation: Secondary | ICD-10-CM | POA: Diagnosis present

## 2020-01-08 DIAGNOSIS — N400 Enlarged prostate without lower urinary tract symptoms: Secondary | ICD-10-CM | POA: Diagnosis not present

## 2020-01-08 DIAGNOSIS — N321 Vesicointestinal fistula: Secondary | ICD-10-CM | POA: Diagnosis not present

## 2020-01-08 DIAGNOSIS — Z832 Family history of diseases of the blood and blood-forming organs and certain disorders involving the immune mechanism: Secondary | ICD-10-CM

## 2020-01-08 DIAGNOSIS — B9561 Methicillin susceptible Staphylococcus aureus infection as the cause of diseases classified elsewhere: Secondary | ICD-10-CM | POA: Diagnosis present

## 2020-01-08 DIAGNOSIS — N3080 Other cystitis without hematuria: Secondary | ICD-10-CM | POA: Diagnosis not present

## 2020-01-08 DIAGNOSIS — Z960 Presence of urogenital implants: Secondary | ICD-10-CM | POA: Diagnosis not present

## 2020-01-08 DIAGNOSIS — S3720XA Unspecified injury of bladder, initial encounter: Secondary | ICD-10-CM

## 2020-01-08 DIAGNOSIS — Y92239 Unspecified place in hospital as the place of occurrence of the external cause: Secondary | ICD-10-CM | POA: Diagnosis not present

## 2020-01-08 DIAGNOSIS — I4891 Unspecified atrial fibrillation: Secondary | ICD-10-CM

## 2020-01-08 DIAGNOSIS — D72823 Leukemoid reaction: Secondary | ICD-10-CM

## 2020-01-08 DIAGNOSIS — Z9049 Acquired absence of other specified parts of digestive tract: Secondary | ICD-10-CM

## 2020-01-08 DIAGNOSIS — Z79899 Other long term (current) drug therapy: Secondary | ICD-10-CM

## 2020-01-08 DIAGNOSIS — Z87891 Personal history of nicotine dependence: Secondary | ICD-10-CM

## 2020-01-08 LAB — URINALYSIS, COMPLETE (UACMP) WITH MICROSCOPIC
Bilirubin Urine: NEGATIVE
Glucose, UA: NEGATIVE mg/dL
Ketones, ur: NEGATIVE mg/dL
Nitrite: NEGATIVE
Protein, ur: 100 mg/dL — AB
RBC / HPF: >50 RBC/hpf — ABNORMAL HIGH (ref 0–5)
Specific Gravity, Urine: 1.021 (ref 1.005–1.030)
Squamous Epithelial / HPF: NONE SEEN (ref 0–5)
WBC, UA: >50 WBC/hpf — ABNORMAL HIGH (ref 0–5)
pH: 6 (ref 5.0–8.0)

## 2020-01-08 LAB — COMPREHENSIVE METABOLIC PANEL
ALT: 20 U/L (ref 0–44)
AST: 18 U/L (ref 15–41)
Albumin: 2.6 g/dL — ABNORMAL LOW (ref 3.5–5.0)
Alkaline Phosphatase: 62 U/L (ref 38–126)
Anion gap: 12 (ref 5–15)
BUN: 20 mg/dL (ref 8–23)
CO2: 21 mmol/L — ABNORMAL LOW (ref 22–32)
Calcium: 8.2 mg/dL — ABNORMAL LOW (ref 8.9–10.3)
Chloride: 106 mmol/L (ref 98–111)
Creatinine, Ser: 0.82 mg/dL (ref 0.61–1.24)
GFR calc Af Amer: >60 mL/min (ref >60)
GFR calc non Af Amer: >60 mL/min (ref >60)
Glucose, Bld: 85 mg/dL (ref 70–99)
Potassium: 3.6 mmol/L (ref 3.5–5.1)
Sodium: 139 mmol/L (ref 135–145)
Total Bilirubin: 1.1 mg/dL (ref 0.3–1.2)
Total Protein: 6.3 g/dL — ABNORMAL LOW (ref 6.5–8.1)

## 2020-01-08 LAB — CBC WITH DIFFERENTIAL/PLATELET
Abs Immature Granulocytes: 4.44 10*3/uL — ABNORMAL HIGH (ref 0.00–0.07)
Basophils Absolute: 0.1 10*3/uL (ref 0.0–0.1)
Basophils Relative: 0 %
Eosinophils Absolute: 0 10*3/uL (ref 0.0–0.5)
Eosinophils Relative: 0 %
HCT: 40.7 % (ref 39.0–52.0)
Hemoglobin: 13.7 g/dL (ref 13.0–17.0)
Immature Granulocytes: 8 %
Lymphocytes Relative: 2 %
Lymphs Abs: 1 10*3/uL (ref 0.7–4.0)
MCH: 30.6 pg (ref 26.0–34.0)
MCHC: 33.7 g/dL (ref 30.0–36.0)
MCV: 91.1 fL (ref 80.0–100.0)
Monocytes Absolute: 11.6 10*3/uL — ABNORMAL HIGH (ref 0.1–1.0)
Monocytes Relative: 21 %
Neutro Abs: 39.4 10*3/uL — ABNORMAL HIGH (ref 1.7–7.7)
Neutrophils Relative %: 69 %
Platelets: 214 10*3/uL (ref 150–400)
RBC: 4.47 MIL/uL (ref 4.22–5.81)
RDW: 13.5 % (ref 11.5–15.5)
Smear Review: NORMAL
WBC: 56.5 10*3/uL (ref 4.0–10.5)
nRBC: 0 % (ref 0.0–0.2)

## 2020-01-08 LAB — LACTIC ACID, PLASMA: Lactic Acid, Venous: 1.3 mmol/L (ref 0.5–1.9)

## 2020-01-08 MED ORDER — ACETAMINOPHEN 325 MG PO TABS: 650.0000 mg | ORAL_TABLET | ORAL | Status: DC | PRN

## 2020-01-08 MED ORDER — VANCOMYCIN HCL IN DEXTROSE 1-5 GM/200ML-% IV SOLN
1000.0000 mg | Freq: Once | INTRAVENOUS | Status: AC
  Administered 2020-01-08: 1000 mg via INTRAVENOUS
  Filled 2020-01-08: qty 200

## 2020-01-08 MED ORDER — SODIUM CHLORIDE 0.9% FLUSH
5.0000 mL | Freq: Three times a day (TID) | INTRAVENOUS | Status: DC
  Administered 2020-01-09 – 2020-01-21 (×39): 5 mL

## 2020-01-08 MED ORDER — METRONIDAZOLE IN NACL 5-0.79 MG/ML-% IV SOLN
500.0000 mg | Freq: Once | INTRAVENOUS | Status: AC
  Administered 2020-01-08: 500 mg via INTRAVENOUS
  Filled 2020-01-08: qty 100

## 2020-01-08 MED ORDER — VANCOMYCIN HCL IN DEXTROSE 1-5 GM/200ML-% IV SOLN
1000.0000 mg | INTRAVENOUS | Status: DC
  Filled 2020-01-08: qty 200

## 2020-01-08 MED ORDER — IOHEXOL 300 MG/ML  SOLN
100.0000 mL | Freq: Once | INTRAMUSCULAR | Status: AC | PRN
  Administered 2020-01-08: 04:00:00 100 mL via INTRAVENOUS

## 2020-01-08 MED ORDER — METRONIDAZOLE IN NACL 5-0.79 MG/ML-% IV SOLN: 500.0000 mg | Freq: Three times a day (TID) | INTRAVENOUS | Status: DC

## 2020-01-08 MED ORDER — LEVETIRACETAM 500 MG PO TABS
500.0000 mg | ORAL_TABLET | Freq: Every evening | ORAL | Status: DC
  Administered 2020-01-08 – 2020-01-20 (×13): 500 mg via ORAL
  Filled 2020-01-08 (×15): qty 1

## 2020-01-08 MED ORDER — LEVETIRACETAM 250 MG PO TABS
250.0000 mg | ORAL_TABLET | Freq: Every day | ORAL | Status: DC
  Administered 2020-01-09 – 2020-01-20 (×12): 250 mg via ORAL
  Filled 2020-01-08 (×14): qty 1

## 2020-01-08 MED ORDER — TAMSULOSIN HCL 0.4 MG PO CAPS
0.4000 mg | ORAL_CAPSULE | Freq: Every day | ORAL | Status: DC
  Administered 2020-01-08 – 2020-01-20 (×13): 0.4 mg via ORAL
  Filled 2020-01-08 (×14): qty 1

## 2020-01-08 MED ORDER — VANCOMYCIN HCL IN DEXTROSE 1-5 GM/200ML-% IV SOLN
1000.0000 mg | Freq: Once | INTRAVENOUS | Status: AC
  Administered 2020-01-08: 10:00:00 1000 mg via INTRAVENOUS
  Filled 2020-01-08: qty 200

## 2020-01-08 MED ORDER — MIDAZOLAM HCL 2 MG/2ML IJ SOLN
INTRAMUSCULAR | Status: AC
  Filled 2020-01-08: qty 2

## 2020-01-08 MED ORDER — VANCOMYCIN HCL IN DEXTROSE 1-5 GM/200ML-% IV SOLN: 1000.0000 mg | Freq: Once | INTRAVENOUS | Status: DC

## 2020-01-08 MED ORDER — ACETAMINOPHEN 325 MG PO TABS: 650.0000 mg | ORAL_TABLET | Freq: Four times a day (QID) | ORAL | Status: DC | PRN

## 2020-01-08 MED ORDER — TRAMADOL HCL 50 MG PO TABS: 50.0000 mg | ORAL_TABLET | Freq: Four times a day (QID) | ORAL | Status: DC | PRN

## 2020-01-08 MED ORDER — METOPROLOL SUCCINATE ER 25 MG PO TB24
25.0000 mg | ORAL_TABLET | Freq: Every day | ORAL | Status: DC
  Administered 2020-01-08 – 2020-01-20 (×13): 25 mg via ORAL
  Filled 2020-01-08 (×15): qty 1

## 2020-01-08 MED ORDER — PIPERACILLIN-TAZOBACTAM 3.375 G IVPB 30 MIN
3.3750 g | Freq: Once | INTRAVENOUS | Status: AC
  Administered 2020-01-08: 3.375 g via INTRAVENOUS
  Filled 2020-01-08: qty 50

## 2020-01-08 MED ORDER — FENTANYL CITRATE (PF) 100 MCG/2ML IJ SOLN
INTRAMUSCULAR | Status: AC | PRN
  Administered 2020-01-08: 25 ug via INTRAVENOUS

## 2020-01-08 MED ORDER — GABAPENTIN 100 MG PO CAPS
100.0000 mg | ORAL_CAPSULE | Freq: Three times a day (TID) | ORAL | Status: DC
  Administered 2020-01-08 – 2020-01-20 (×36): 100 mg via ORAL
  Filled 2020-01-08 (×37): qty 1

## 2020-01-08 MED ORDER — ONDANSETRON HCL 4 MG/2ML IJ SOLN
4.0000 mg | Freq: Once | INTRAMUSCULAR | Status: AC
  Administered 2020-01-08: 4 mg via INTRAVENOUS
  Filled 2020-01-08: qty 2

## 2020-01-08 MED ORDER — FENTANYL CITRATE (PF) 100 MCG/2ML IJ SOLN
50.0000 ug | Freq: Once | INTRAMUSCULAR | Status: AC
  Administered 2020-01-08: 03:00:00 50 ug via INTRAVENOUS
  Filled 2020-01-08: qty 2

## 2020-01-08 MED ORDER — MIDAZOLAM HCL 2 MG/2ML IJ SOLN
INTRAMUSCULAR | Status: AC | PRN
  Administered 2020-01-08: 0.5 mg via INTRAVENOUS

## 2020-01-08 MED ORDER — VANCOMYCIN HCL IN DEXTROSE 1-5 GM/200ML-% IV SOLN: 1000.0000 mg | INTRAVENOUS | Status: DC

## 2020-01-08 MED ORDER — AMIODARONE HCL 200 MG PO TABS
100.0000 mg | ORAL_TABLET | Freq: Every day | ORAL | Status: DC
  Administered 2020-01-09 – 2020-01-15 (×7): 100 mg via ORAL
  Filled 2020-01-08 (×7): qty 1

## 2020-01-08 MED ORDER — MORPHINE SULFATE (PF) 4 MG/ML IV SOLN
4.0000 mg | Freq: Once | INTRAVENOUS | Status: AC
  Administered 2020-01-08: 05:00:00 4 mg via INTRAVENOUS
  Filled 2020-01-08: qty 1

## 2020-01-08 MED ORDER — VANCOMYCIN HCL 2000 MG/400ML IV SOLN
2000.0000 mg | INTRAVENOUS | Status: DC
  Administered 2020-01-09 – 2020-01-18 (×10): 2000 mg via INTRAVENOUS
  Filled 2020-01-08 (×12): qty 400

## 2020-01-08 MED ORDER — FENTANYL CITRATE (PF) 100 MCG/2ML IJ SOLN
INTRAMUSCULAR | Status: AC
  Filled 2020-01-08: qty 2

## 2020-01-08 MED ORDER — PIPERACILLIN-TAZOBACTAM 3.375 G IVPB
3.3750 g | Freq: Three times a day (TID) | INTRAVENOUS | Status: DC
  Administered 2020-01-08 – 2020-01-14 (×19): 3.375 g via INTRAVENOUS
  Filled 2020-01-08 (×20): qty 50

## 2020-01-08 MED ORDER — FINASTERIDE 5 MG PO TABS
5.0000 mg | ORAL_TABLET | Freq: Every day | ORAL | Status: DC
  Administered 2020-01-09 – 2020-01-20 (×12): 5 mg via ORAL
  Filled 2020-01-08 (×13): qty 1

## 2020-01-08 MED ORDER — PIPERACILLIN-TAZOBACTAM 3.375 G IVPB 30 MIN: 3.3750 g | Freq: Three times a day (TID) | INTRAVENOUS | Status: DC

## 2020-01-08 MED ORDER — AMLODIPINE BESYLATE 5 MG PO TABS
5.0000 mg | ORAL_TABLET | Freq: Every day | ORAL | Status: DC
  Administered 2020-01-09 – 2020-01-20 (×12): 5 mg via ORAL
  Filled 2020-01-08 (×15): qty 1

## 2020-01-08 MED ORDER — ONDANSETRON HCL 4 MG PO TABS
4.0000 mg | ORAL_TABLET | Freq: Four times a day (QID) | ORAL | Status: DC | PRN
  Filled 2020-01-08: qty 1

## 2020-01-08 MED ORDER — ACETAMINOPHEN 650 MG RE SUPP: 650.0000 mg | Freq: Four times a day (QID) | RECTAL | Status: DC | PRN

## 2020-01-08 MED ORDER — PANTOPRAZOLE SODIUM 40 MG PO TBEC
40.0000 mg | DELAYED_RELEASE_TABLET | Freq: Every day | ORAL | Status: DC
  Administered 2020-01-08 – 2020-01-20 (×13): 40 mg via ORAL
  Filled 2020-01-08 (×14): qty 1

## 2020-01-08 MED ORDER — ONDANSETRON HCL 4 MG/2ML IJ SOLN: 4.0000 mg | Freq: Four times a day (QID) | INTRAMUSCULAR | Status: DC | PRN

## 2020-01-08 MED ORDER — HYDROCODONE-ACETAMINOPHEN 5-325 MG PO TABS
1.0000 | ORAL_TABLET | ORAL | Status: DC | PRN
  Administered 2020-01-10 – 2020-01-13 (×5): 2 via ORAL
  Administered 2020-01-15 (×2): 1 via ORAL
  Administered 2020-01-17 (×2): 2 via ORAL
  Administered 2020-01-19 – 2020-01-20 (×4): 1 via ORAL
  Filled 2020-01-08 (×3): qty 2
  Filled 2020-01-08 (×2): qty 1
  Filled 2020-01-08: qty 2
  Filled 2020-01-08: qty 1
  Filled 2020-01-08 (×2): qty 2
  Filled 2020-01-08 (×2): qty 1
  Filled 2020-01-08: qty 2
  Filled 2020-01-08: qty 1

## 2020-01-08 MED ORDER — SODIUM CHLORIDE 0.45 % IV SOLN: INTRAVENOUS | Status: DC

## 2020-01-08 NOTE — H&P (Signed)
History and Physical    [name] [id number] DOB: July 02, 1929 DOA: 01/31/2020  PCP: Venia Carbon, MD   Patient coming from:SNF I have personally briefly reviewed patient's old medical records in [place]  Chief Complaint: abdominal pain  HPI: Jimmy Moore is a 86 y.o. male with medical history significant for dementia, CAD, diastolic heart failure, hypertension, peripheral vascular disease and A. fib on Eliquis, recently hospitalized from 12/30/2019 to 01/03/2020 with COVID-19 pneumonia after initially presenting with hematuria that cleared with bladder irrigation who returns to the emergency room with a complaint of abdominal pain.  He has no associated fever or chills, change in bowel habits.  Has no chest pain.  ED Course: On arrival in the emergency room he was afebrile with a temperature of 98.3, blood pressure 152/76 heart rate 92 respirations 20 with O2 sat 90% on room air.  His blood work was notable for white cell count of 56,000.  Hemoglobin was 11.  His chemistries were mostly unremarkable.  Urinalysis showed large leukocyte esterase negative nitrites.  CT abdomen and pelvis significant for abdominal abscess contiguous with an inflamed bladder dome with possible enterovesicular fistulization.  Also showed solid dative and reticular opacities in the lower lobes right middle lobe and lingula compatible with atypical infection.  Patient was started on IV Zosyn Flagyl and vancomycin.  Emergency room provider, spoke with urologist, Dr. Caprice Beaver who stated that patient might not be a surgical candidate but he will see patient in consultation and recommended considering interventional radiology.  Review of Systems: As per HPI otherwise 10 point review of systems negative.    Past Medical History:  Diagnosis Date   Atherosclerotic heart disease of native coronary artery without angina pectoris    Benign prostatic hyperplasia without lower urinary tract symptoms    Chronic  diastolic (congestive) heart failure (HCC)    Hyperlipidemia    Hypertensive heart disease with heart failure (HCC)    Peripheral vascular disease (HCC)    Unspecified atrial fibrillation ([place])    Unspecified dementia without behavioral disturbance ([place])     History reviewed. No pertinent surgical history.   reports that he has quit smoking. He has never used smokeless tobacco. He reports previous alcohol use. He reports that he does not use drugs.  No Known Allergies  History reviewed. No pertinent family history.   Prior to Admission medications   Medication Sig Start Date End Date Taking? Authorizing Provider  acetaminophen (TYLENOL) 325 MG tablet Take 650 mg by mouth every 4 (four) hours as needed for mild pain or fever.    [provider]  albuterol (VENTOLIN HFA) 108 (90 Base) MCG/ACT inhaler Inhale 1 puff into the lungs every 12 (twelve) hours as needed for wheezing or shortness of breath.    [provider]  amiodarone (PACERONE) 100 MG tablet Take 100 mg by mouth daily.    [provider]  amLODipine (NORVASC) 5 MG tablet Take 5 mg by mouth daily.    [provider]  cephALEXin (KEFLEX) 500 MG capsule Take 1 capsule (500 mg total) by mouth every 8 (eight) hours for 5 days. 01/03/20 01/23/2020  Danford, Suann Larry, MD  dexamethasone (DECADRON) 2 MG tablet Take 6 mg for 1 day then take 4 mg for 1 day then take 2 mg for 1 day then stop 01/03/20   Danford, Suann Larry, MD  dextromethorphan-guaiFENesin (TUSSIN DM) 10-100 MG/5ML liquid Take 10 mLs by mouth every 4 (four) hours as needed for cough.  [provider]  feeding supplement, ENSURE ENLIVE, (ENSURE ENLIVE) LIQD Take 237 mLs by mouth 3 (three) times daily between meals. 01/03/20   Danford, Suann Larry, MD  finasteride (PROSCAR) 5 MG tablet Take 1 tablet (5 mg total) by mouth daily. 01/03/20   Danford, Suann Larry, MD  gabapentin (NEURONTIN) 100 MG capsule Take 100 mg by mouth 3  (three) times daily.    [provider]  levETIRAcetam (KEPPRA) 250 MG tablet Take 500 mg by mouth every evening.    [provider]  levETIRAcetam (KEPPRA) 250 MG tablet Take 250 mg by mouth daily.    [provider]  losartan (COZAAR) 100 MG tablet Take 100 mg by mouth daily.    [provider]  metoprolol succinate (TOPROL-XL) 25 MG 24 hr tablet Take 25 mg by mouth daily.    [provider]  omeprazole (PRILOSEC) 20 MG capsule Take 20 mg by mouth at bedtime.    [provider]  tamsulosin (FLOMAX) 0.4 MG CAPS capsule Take 0.4 mg by mouth daily.    [provider]  traMADol (ULTRAM) 50 MG tablet Take 1 tablet (50 mg total) by mouth every 6 (six) hours as needed for moderate pain. 01/03/20   Edwin Dada, MD    Physical Exam: Vitals:   01/31/2020 0149 01/31/2020 0150  BP: (!) 152/76   Pulse: 92   Resp: 20   Temp: 98.8 F (37.1 C)   TempSrc: Oral   SpO2: 90%   Weight:  87.9 kg  Height:  6\' 4"  (1.93 m)     Vitals:   01/31/2020 0149 01/07/2020 0150  BP: (!) 152/76   Pulse: 92   Resp: 20   Temp: 98.8 F (37.1 C)   TempSrc: Oral   SpO2: 90%   Weight:  87.9 kg  Height:  6\' 4"  (1.93 m)    Constitutional: NAD, alert and oriented x 2 Eyes: PERRL, lids and conjunctivae normal ENMT: Mucous membranes are moist.  Neck: normal, supple, no masses, no thyromegaly Respiratory: clear to auscultation bilaterally, no wheezing, no crackles. Normal respiratory effort. No accessory muscle use.  Cardiovascular: Regular rate and rhythm, no murmurs / rubs / gallops. No extremity edema. 2+ pedal pulses. No carotid bruits.  Abdomen: Tender on palpation in mid and lower abdomen. No hepatosplenomegaly. Bowel sounds positive.  Musculoskeletal: no clubbing / cyanosis. No joint deformity upper and lower extremities.  Skin: no rashes, lesions, ulcers.  Neurologic: No gross focal neurologic deficit.    Labs on Admission: I have  personally reviewed following labs and imaging studies  CBC: Recent Labs  Lab 01/02/20 0554 01/03/20 0746 01/12/2020 0232  WBC 11.9* 12.1* 56.5*  NEUTROABS 7.4 8.2* 39.4*  HGB 13.0 12.7* 13.7  HCT 39.3 35.5* 40.7  MCV 91.0 86.6 91.1  PLT 322 287 [id number]   Basic Metabolic Panel: Recent Labs  Lab 01/02/20 0554 01/03/20 0746 01/16/2020 0232  NA 140 139 139  K 4.0 4.0 3.6  CL 111 110 106  CO2 21* 22 21*  GLUCOSE 118* 121* 85  BUN 26* 23 20  CREATININE 0.80 0.66 0.82  CALCIUM 8.4* 8.3* 8.2*  MG 2.0 1.8  --   PHOS 2.3* 2.4*  --    GFR: Estimated Creatinine Clearance: 76.4 mL/min (by C-G formula based on SCr of 0.82 mg/dL). Liver Function Tests: Recent Labs  Lab 01/02/20 0554 01/03/20 0746 01/26/2020 0232  AST 27 22 18   ALT 24 22 20   ALKPHOS 52 49 62  BILITOT 0.8 0.8 1.1  PROT 6.6 6.3* 6.3*  ALBUMIN 2.7* 2.5* 2.6*   No results for input(s): LIPASE, AMYLASE in the last 168 hours. No results for input(s): AMMONIA in the last 168 hours. Coagulation Profile: No results for input(s): INR, PROTIME in the last 168 hours. Cardiac Enzymes: No results for input(s): CKTOTAL, CKMB, CKMBINDEX, TROPONINI in the last 168 hours. BNP (last 3 results) No results for input(s): PROBNP in the last 8760 hours. HbA1C: No results for input(s): HGBA1C in the last 72 hours. CBG: No results for input(s): GLUCAP in the last 168 hours. Lipid Profile: No results for input(s): CHOL, HDL, LDLCALC, TRIG, CHOLHDL, LDLDIRECT in the last 72 hours. Thyroid Function Tests: No results for input(s): TSH, T4TOTAL, FREET4, T3FREE, THYROIDAB in the last 72 hours. Anemia Panel: No results for input(s): VITAMINB12, FOLATE, FERRITIN, TIBC, IRON, RETICCTPCT in the last 72 hours. Urine analysis:    Component Value Date/Time   COLORURINE AMBER (A) 01/19/2020 0232   APPEARANCEUR CLOUDY (A) 01/14/2020 0232   LABSPEC 1.021 01/20/2020 0232   PHURINE 6.0 01/06/2020 0232   GLUCOSEU NEGATIVE 01/15/2020 0232   HGBUR  LARGE (A) 01/02/2020 0232   BILIRUBINUR NEGATIVE 01/04/2020 0232   KETONESUR NEGATIVE 01/16/2020 0232   PROTEINUR 100 (A) 01/12/2020 0232   NITRITE NEGATIVE 01/17/2020 0232   LEUKOCYTESUR LARGE (A) 01/11/2020 0232    Radiological Exams on Admission: CT ABDOMEN PELVIS W CONTRAST  Result Date: 01/28/2020 CLINICAL DATA:  Unspecified abdominal pain, COVID-19 positive EXAM: CT ABDOMEN AND PELVIS WITH CONTRAST TECHNIQUE: Multidetector CT imaging of the abdomen and pelvis was performed using the standard protocol following bolus administration of intravenous contrast. CONTRAST:  120mL OMNIPAQUE IOHEXOL 300 MG/ML  SOLN COMPARISON:  None. FINDINGS: Lower chest: Mixed ground-glass, consolidative and reticular opacities are present in the lower lobes, right middle lobe and lingula compatible with atypical infection in the setting of COVID-19 positivity. Mild airways thickening is present as well. Hepatobiliary: No focal liver abnormality is seen. No gallstones, gallbladder wall thickening, or biliary dilatation. Pancreas: Fatty replacement of the pancreas. No pancreatic ductal dilatation or surrounding inflammatory changes. Spleen: Normal in size without focal abnormality. 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 No worrisome adrenal lesions. Mild bilateral symmetric perinephric stranding, a nonspecific finding though may correlate with either age or decreased renal function. Kidneys enhance and excrete symmetrically. Some mild left urothelial thickening and periureteral stranding is present. The urinary bladder is circumferentially thickened with multiple foci intraluminal gas. Several punctate foci of gas are seen within the wall of the bladder. These could feasibly reflect gas within small bladder diverticula or crenulation given an enlarged prostate and likely sequela of chronic outlet obstruction however  emphysematous cystitis is not fully excluded. Furthermore, there is focal discontinuity along the dome of the bladder contiguous with a an irregular air and fluid containing collection in the midline abdomen closely apposed to several loops of thickened and enhancing small bowel. An enterovesicular fistula is suspected. Stomach/Bowel: Distal esophagus, stomach and duodenal sweep are unremarkable. There are few focally thickened segments of small bowel with irregular mural enhancement and edematous change in the low anterior mid abdomen centered upon a rim enhancing 5.2 x 2.6 x 7.7cm air and fluid containing collection worrisome for developing abscess or potential enterovesicular fistula given direct continuity the dome of the bladder as detailed above. More distal small bowel has a normal appearance. Appendix is  not well visualized. No focal pericecal inflammation is seen. Pancolonic diverticulosis without evidence of acute diverticulitis. Vascular/Lymphatic: Extensive atherosclerotic calcification of the abdominal aorta, iliac arteries and branch vessels. No aneurysm or ectasia is seen. Reactive adenopathy in the low mesentery with some geographic mesenteric stranding involving the affected bowel loops detailed above. Reproductive: The prostate is markedly enlarged. A Foley catheter appears inflated in the low prosthetic urethra. Some focal hypoattenuating cystic foci within the prostate could reflect possible abscess given the adjacent inflammation and stranding. Other: Phlegmonous change in the mid mesentery with the air and fluid containing collection are arising from the dome of the bladder and or bowel loops. No free intraperitoneal air is seen. No bowel containing hernias. Musculoskeletal: Atrophy of the anterior abdominal wall musculature Multilevel degenerative changes are present in the imaged portions of the spine. No acute osseous abnormality or suspicious osseous lesion. Prior sternotomy changes are  noted. Additional degenerative changes noted in the hips and SI joints bilaterally as well as the symphysis pubis. IMPRESSION: 1. 5.2 x 2.6 x 7.7cm rim enhancing air and fluid containing collection in the low anterior mid abdomen. Collection is directly contiguous with the inflamed bladder dome with several adjacent loops of edematous and thickened small bowel. Overall appearance is worrisome for an abscess with possible enterovesicular fistulization. 2. Bladder itself is circumferentially thickened with gas in the bladder wall. While these foci of air could be within granulations of the bladder given an enlarged prostate and likely some sequela of chronic outlet obstruction, given the patient's extreme white count features are highly suspicious for an emphysematous cystitis. 3. Inflated Foley catheter balloon is positioned within the prostatic urethra. Recommend repositioning or removal. 4. Rim enhancing low attenuation collection within the prostate parenchyma worrisome for potential abscess. 5. Mixed ground-glass, consolidative and reticular opacities in the lower lobes, right middle lobe and lingula compatible with atypical infection in the setting of COVID positivity positivity. 6. Colonic diverticulosis without evidence of diverticulitis. 7. 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 8.  Aortic Atherosclerosis (ICD10-I70.0). These results were called by telephone at the time of interpretation on 01/28/2020 at 4:46 am to provider [facility] , who verbally acknowledged these results. Electronically Signed   By: Lovena Le M.D.   On: 01/18/2020 04:47    EKG: Independently reviewed.   Assessment/Plan    Emphysematous cystitis   Postprocedural intraabdominal abscess -Continue broad-spectrum antibiotics of Zosyn Flagyl and vancomycin -Urology consult placed for further recommendations patient may not be a good surgical candidate --Consider IR, pending urology  consult --Continue with Foley catheter placed in the ER   Chronic atrial fibrillation (HCC) -Currently rate controlled -Hold anticoagulation anticipated procedure    Chronic diastolic (congestive) heart failure ([place]) -Not acutely exacerbated -Monitor for fluid overload as patient receiving IV fluids    History of COVID-19 pneumonia -Diagnosed with Covid on 12/30/2019 -Not currently symptomatic for Covid  Dementia without behavioral disturbance -Monitor for behavioral disturbance  Coronary artery disease -No complaints of chest pain -Continue home meds      DVT prophylaxis: scd  Code Status: DNR/DNI  Family Communication: none  Disposition Plan: Back to previous home environment Consults called: Dr Caprice Beaver, urology     Athena Masse MD [facility]     01/05/2020, 6:11 AM

## 2020-01-08 NOTE — ED Notes (Signed)
Abdominal dressing is clean and dry and intact.

## 2020-01-08 NOTE — ED Notes (Signed)
Patient was sat in a high-Fowler's position and given sips of water before giving the oral meds. Patient was able to tolerate the first meds without difficulty, but coughed after metoprolol was given. Patient coughed with further sips of water. Amiodarone, amlopidine and finasteride were held pending notification of provider.

## 2020-01-08 NOTE — Progress Notes (Signed)
Patient clinically stable post Abscess drain placement per Dr Pascal Lux, vitals stable throughout procedure. Dressing dry and intact to mid abdomen with JP drain. Denies complaints at this time. [place] to name, but dozing at intervals, this is from preprocedure to be at baseline. Received  Versed .5mg  along with Fentanyl 64mcg IV for procedure. Report given to [place] in ER post procedure after procedure recovery with questions answered.

## 2020-01-08 NOTE — ED Notes (Signed)
Date and time results received: 01/02/2020 0424 (use smartphrase ".now" to insert current time)  Test: WBC  Critical Value: 56.5  Name of Provider Notified: Dr. Alfred Levins   Orders Received? Or Actions Taken?: N/A

## 2020-01-08 NOTE — ED Notes (Signed)
Patient placed on bed and has small 1 cm open area on left buttock.  Dressing applied to bottom.

## 2020-01-08 NOTE — ED Notes (Signed)
Telephone report given to radiology RN.

## 2020-01-08 NOTE — ED Notes (Signed)
Rosann Auerbach, RN, called for pt to be arriving

## 2020-01-08 NOTE — Progress Notes (Signed)
PROGRESS NOTE    Jimmy Moore  [id number]  DOB: 04-22-1929  DOA: 01/29/2020 PCP: Venia Carbon, MD Outpatient Specialists:   Hospital course:  Patient is an 86 year old man with past medical history significant for CAD, HFpEF, HTN, PVD, A. 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  Patient returned to the hospital 01/07/2020 complaining of sharp lower abdominal pain.  Work-up revealed 8 cm rim-enhancing air and fluid collection in the mid abdomen contiguous with the bladder concerning for enterovesical fistula.  Patient is now admitted for treatment of abdominal mass.  Subjective:  Patient himself is not very communicative.  Appears weak.  Does admit to abdominal pain.  Not quite sure why he is in the hospital but notes he does not feel well.  Objective: Vitals:   01/29/2020 1735 01/02/2020 1740 01/23/2020 1745 01/07/2020 1750  BP: 135/61  132/62 (!) 159/66  Pulse: 84 85 94 81  Resp: 17 (!) 22 (!) 22 20  Temp:      TempSrc:      SpO2: 95% 95% 95% 91%  Weight:      Height:       No intake or output data in the 24 hours ending 01/15/2020 1835 Filed Weights   01/28/2020 0150  Weight: 87.9 kg    Exam:  General: Frail elderly man lying flat and listless laying in bed appearing uncomfortable. Eyes: sclera anicteric, sunken eye sockets CVS: [id number] clear 2/6 systolic murmur. Respiratory: Decreased air entry bilaterally secondary to decreased inspiratory effort. GI: Patient does have bowel sounds.  His abdomen is soft.  It is quite tender to light palpation in the suprapubic and right lower quadrant.  He does have voluntary guarding.  There is local rebound. LE: No edema. 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  Of  note patient's white count has gone from 12 to 56K with absolute neutrophil count of 40K.   Intraabdominal abscess Patient seen by urology who placed a Foley in noted he was not a surgical candidate Patient seen by general surgery who also felt he was too frail for general surgery recommended IR IR has agreed to place a drain today Patient is on Zosyn and vancomycin day #2. Definitive therapy will be difficult in this frail elderly patient. Patient is DNR.  He does appear to have a mass which may be RCC.  Abnormal WBC Most likely leukemoid reaction to acute infection versus secondary to probably undiagnosed RCC. Will follow  Atrial fibrillation, chronic Rate normal Hold Eliquis for now, can restart tomorrow after discussion with IR Continue amiodarone, metoprolol  Coronary disease, secondary prevention Hypertension Chronic diastolic CHF Euvolemic, blood pressure normal Continue amlodipine, metoprolol Hold losartan for now, rate can re-add back if blood pressure warrants. Patient does not appear to be on antiplatelet agent at home.   BPH Continue tamsulosin and finasteride  GERD Continue pantoprazole  Seizure disorder Continues Keppra   History of COVID-19 pneumonia Diagnosed with Covid on 12/30/2019, status post treatment with remdesivir and steroids discharged 01/01/2020.  Patient is presently not hypoxic  Dementia without behavioral disturbance Monitor for behavioral disturbance  High risk of malnutrition He has moderate muscle mass and fat loss.   DVT prophylaxis: Patient getting abdominal instrumentation today, will restart  Eliquis in the morning Code Status: DNR Family Communication: We will call and speak with his wife Disposition Plan: TBD   Consultants:  Urology  General surgery  IR  Procedures:  Placement of abdominal abscess drain percutaneously by IR  Antimicrobials:  Vancomycin  Zosyn  Data Reviewed: Basic Metabolic  Panel: Recent Labs  Lab 01/02/20 0554 01/03/20 0746 01/10/2020 0232  NA 140 139 139  K 4.0 4.0 3.6  CL 111 110 106  CO2 21* 22 21*  GLUCOSE 118* 121* 85  BUN 26* 23 20  CREATININE 0.80 0.66 0.82  CALCIUM 8.4* 8.3* 8.2*  MG 2.0 1.8  --   PHOS 2.3* 2.4*  --    Liver Function Tests: Recent Labs  Lab 01/02/20 0554 01/03/20 0746 01/09/2020 0232  AST 27 22 18   ALT 24 22 20   ALKPHOS 52 49 62  BILITOT 0.8 0.8 1.1  PROT 6.6 6.3* 6.3*  ALBUMIN 2.7* 2.5* 2.6*   No results for input(s): LIPASE, AMYLASE in the last 168 hours. No results for input(s): AMMONIA in the last 168 hours. CBC: Recent Labs  Lab 01/02/20 0554 01/03/20 0746 01/01/2020 0232  WBC 11.9* 12.1* 56.5*  NEUTROABS 7.4 8.2* 39.4*  HGB 13.0 12.7* 13.7  HCT 39.3 35.5* 40.7  MCV 91.0 86.6 91.1  PLT 322 287 214   Cardiac Enzymes: No results for input(s): CKTOTAL, CKMB, CKMBINDEX, TROPONINI in the last 168 hours. BNP (last 3 results) No results for input(s): PROBNP in the last 8760 hours. CBG: No results for input(s): GLUCAP in the last 168 hours.  Recent Results (from the past 240 hour(s))  Urine Culture     Status: Abnormal   Collection Time: 12/30/19 12:19 PM   Specimen: Urine, Random  Result Value Ref Range Status   Specimen Description   Final    URINE, RANDOM Performed at [facility], [address], [place], [place] [address]    Special Requests   Final    Normal Performed at [facility], [address] [place], [place] [address]    Culture MULTIPLE SPECIES PRESENT, SUGGEST RECOLLECTION (A)  Final   Report Status 12/31/2019 FINAL  Final  Culture, blood (Routine X 2) w Reflex to ID Panel     Status: None   Collection Time: 12/30/19  1:38 PM   Specimen: BLOOD  Result Value Ref Range Status   Specimen Description BLOOD BLOOD RIGHT ARM  Final   Special Requests   Final    BOTTLES DRAWN AEROBIC AND ANAEROBIC Blood Culture adequate volume   Culture   Final    NO GROWTH 5  DAYS Performed at [facility], [address], [place],  [address]    Report Status 01/04/2020 FINAL  Final  Culture, blood (Routine X 2) w Reflex to ID Panel     Status: None   Collection Time: 12/30/19  1:38 PM   Specimen: BLOOD  Result Value Ref Range Status   Specimen Description BLOOD RIGHT ANTECUBITAL  Final   Special Requests   Final    BOTTLES DRAWN AEROBIC AND ANAEROBIC Blood Culture adequate volume   Culture   Final    NO GROWTH 5 DAYS Performed at [facility], [address] [place],  [address]    Report Status 01/04/2020 FINAL  Final  MRSA PCR Screening     Status: Abnormal   Collection Time: 01/01/20  6:00 AM   Specimen: Nasal Mucosa; Nasopharyngeal  Result Value Ref Range Status  MRSA by PCR POSITIVE (A) NEGATIVE Final    Comment:        The GeneXpert MRSA Assay (FDA approved for NASAL specimens only), is one component of a comprehensive MRSA colonization surveillance program. It is not intended to diagnose MRSA infection nor to guide or monitor treatment for MRSA infections. RESULT CALLED TO, READ BACK BY AND VERIFIED WITH: BETH SMITH AT [id number] ON 01/01/2020 [place]. Performed at [facility], [address] [place], [place] [address]   Urine Culture     Status: Abnormal   Collection Time: 01/03/20 10:36 AM   Specimen: Urine, Random  Result Value Ref Range Status   Specimen Description   Final    URINE, RANDOM Performed at [facility], [address] [place], [place] [address]    Special Requests   Final    NONE Performed at [facility], [address] [place], [place] [address]    Culture (A)  Final    70,000 COLONIES/mL METHICILLIN RESISTANT STAPHYLOCOCCUS AUREUS >=100,000 COLONIES/mL PSEUDOMONAS AERUGINOSA    Report Status 01/05/2020 FINAL  Final   Organism ID, Bacteria METHICILLIN RESISTANT STAPHYLOCOCCUS AUREUS (A)  Final   Organism ID, Bacteria PSEUDOMONAS AERUGINOSA (A)  Final       Susceptibility   Methicillin resistant staphylococcus aureus - MIC*    CIPROFLOXACIN >=8 RESISTANT Resistant     GENTAMICIN <=0.5 SENSITIVE Sensitive     NITROFURANTOIN <=16 SENSITIVE Sensitive     OXACILLIN >=4 RESISTANT Resistant     TETRACYCLINE <=1 SENSITIVE Sensitive     VANCOMYCIN 1 SENSITIVE Sensitive     TRIMETH/SULFA <=10 SENSITIVE Sensitive     CLINDAMYCIN >=8 RESISTANT Resistant     RIFAMPIN <=0.5 SENSITIVE Sensitive     Inducible Clindamycin NEGATIVE Sensitive     * 70,000 COLONIES/mL METHICILLIN RESISTANT STAPHYLOCOCCUS AUREUS   Pseudomonas aeruginosa - MIC*    CEFTAZIDIME 2 SENSITIVE Sensitive     CIPROFLOXACIN 0.5 SENSITIVE Sensitive     GENTAMICIN <=1 SENSITIVE Sensitive     IMIPENEM >=16 RESISTANT Resistant     PIP/TAZO <=4 SENSITIVE Sensitive     CEFEPIME 2 SENSITIVE Sensitive     * >=100,000 COLONIES/mL PSEUDOMONAS AERUGINOSA  Blood culture (routine x 2)     Status: None (Preliminary result)   Collection Time: 01/05/2020  5:11 AM   Specimen: BLOOD  Result Value Ref Range Status   Specimen Description BLOOD LEFT ASSIST CONTROL  Final   Special Requests   Final    BOTTLES DRAWN AEROBIC AND ANAEROBIC Blood Culture adequate volume   Culture   Final    NO GROWTH < 12 HOURS Performed at [facility], [address] [place], [place] [address]    Report Status PENDING  Incomplete  Blood culture (routine x 2)     Status: None (Preliminary result)   Collection Time: 01/07/2020  5:12 AM   Specimen: BLOOD  Result Value Ref Range Status   Specimen Description BLOOD RIGHT ASSIST CONTROL  Final   Special Requests   Final    BOTTLES DRAWN AEROBIC AND ANAEROBIC Blood Culture adequate volume   Culture   Final    NO GROWTH < 12 HOURS Performed at [facility], [address] [place], [place] [address]    Report Status PENDING  Incomplete         Studies: CT ABDOMEN PELVIS W CONTRAST  Result Date: 01/01/2020 CLINICAL DATA:  Unspecified  abdominal pain, COVID-19 positive EXAM: CT ABDOMEN AND PELVIS WITH  CONTRAST TECHNIQUE: Multidetector CT imaging of the abdomen and pelvis was performed using the standard protocol following bolus administration of intravenous contrast. CONTRAST:  170mL OMNIPAQUE IOHEXOL 300 MG/ML  SOLN COMPARISON:  None. FINDINGS: Lower chest: Mixed ground-glass, consolidative and reticular opacities are present in the lower lobes, right middle lobe and lingula compatible with atypical infection in the setting of COVID-19 positivity. Mild airways thickening is present as well. Hepatobiliary: No focal liver abnormality is seen. No gallstones, gallbladder wall thickening, or biliary dilatation. Pancreas: Fatty replacement of the pancreas. No pancreatic ductal dilatation or surrounding inflammatory changes. Spleen: Normal in size without focal abnormality. 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 No worrisome adrenal lesions. Mild bilateral symmetric perinephric stranding, a nonspecific finding though may correlate with either age or decreased renal function. Kidneys enhance and excrete symmetrically. Some mild left urothelial thickening and periureteral stranding is present. The urinary bladder is circumferentially thickened with multiple foci intraluminal gas. Several punctate foci of gas are seen within the wall of the bladder. These could feasibly reflect gas within small bladder diverticula or crenulation given an enlarged prostate and likely sequela of chronic outlet obstruction however emphysematous cystitis is not fully excluded. Furthermore, there is focal discontinuity along the dome of the bladder contiguous with a an irregular air and fluid containing collection in the midline abdomen closely apposed to several loops of thickened and enhancing small bowel. An enterovesicular fistula is suspected. Stomach/Bowel:  Distal esophagus, stomach and duodenal sweep are unremarkable. There are few focally thickened segments of small bowel with irregular mural enhancement and edematous change in the low anterior mid abdomen centered upon a rim enhancing 5.2 x 2.6 x 7.7cm air and fluid containing collection worrisome for developing abscess or potential enterovesicular fistula given direct continuity the dome of the bladder as detailed above. More distal small bowel has a normal appearance. Appendix is not well visualized. No focal pericecal inflammation is seen. Pancolonic diverticulosis without evidence of acute diverticulitis. Vascular/Lymphatic: Extensive atherosclerotic calcification of the abdominal aorta, iliac arteries and branch vessels. No aneurysm or ectasia is seen. Reactive adenopathy in the low mesentery with some geographic mesenteric stranding involving the affected bowel loops detailed above. Reproductive: The prostate is markedly enlarged. A Foley catheter appears inflated in the low prosthetic urethra. Some focal hypoattenuating cystic foci within the prostate could reflect possible abscess given the adjacent inflammation and stranding. Other: Phlegmonous change in the mid mesentery with the air and fluid containing collection are arising from the dome of the bladder and or bowel loops. No free intraperitoneal air is seen. No bowel containing hernias. Musculoskeletal: Atrophy of the anterior abdominal wall musculature Multilevel degenerative changes are present in the imaged portions of the spine. No acute osseous abnormality or suspicious osseous lesion. Prior sternotomy changes are noted. Additional degenerative changes noted in the hips and SI joints bilaterally as well as the symphysis pubis. IMPRESSION: 1. 5.2 x 2.6 x 7.7cm rim enhancing air and fluid containing collection in the low anterior mid abdomen. Collection is directly contiguous with the inflamed bladder dome with several adjacent loops of edematous and  thickened small bowel. Overall appearance is worrisome for an abscess with possible enterovesicular fistulization. 2. Bladder itself is circumferentially thickened with gas in the bladder wall. While these foci of air could be within granulations of the bladder given an enlarged prostate and likely some sequela of chronic outlet obstruction, given the  patient's extreme white count features are highly suspicious for an emphysematous cystitis. 3. Inflated Foley catheter balloon is positioned within the prostatic urethra. Recommend repositioning or removal. 4. Rim enhancing low attenuation collection within the prostate parenchyma worrisome for potential abscess. 5. Mixed ground-glass, consolidative and reticular opacities in the lower lobes, right middle lobe and lingula compatible with atypical infection in the setting of COVID positivity positivity. 6. Colonic diverticulosis without evidence of diverticulitis. 7. 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 8.  Aortic Atherosclerosis (ICD10-I70.0). These results were called by telephone at the time of interpretation on 01/14/2020 at 4:46 am to provider [facility] , who verbally acknowledged these results. Electronically Signed   By: Lovena Le M.D.   On: 01/24/2020 04:47        Scheduled Meds: . fentaNYL      . midazolam       Continuous Infusions: . sodium chloride 100 mL/hr at 01/12/2020 0954  . piperacillin-tazobactam (ZOSYN)  IV 3.375 g (01/23/2020 1356)  . [START ON 01/09/2020] vancomycin    . vancomycin    . [START ON 01/09/2020] vancomycin      Principal Problem:   Emphysematous cystitis Active Problems:   Chronic atrial fibrillation (HCC)   Chronic diastolic (congestive) heart failure (HCC)   History of COVID-19   Postprocedural intraabdominal abscess    Time spent: [place], MD, FACP, [facility]. [facility]  If 7PM-7AM, please contact  night-coverage [email or web address] Password [facility] 01/16/2020, 6:35 PM    LOS: 0 days

## 2020-01-08 NOTE — ED Notes (Signed)
Pt O2 is 88% on room air. Pt placed on 2L of O2 and SpO2 is now 94%. States that he is not on oxygen at home.

## 2020-01-08 NOTE — ED Notes (Addendum)
Zosyn and fluids were paused for procedure. Both have been restarted. Patient intermittently takes off O2. O2 via Muse was put back in place.

## 2020-01-08 NOTE — ED Notes (Signed)
Patient taken to Interventional Radiology by their staff.

## 2020-01-08 NOTE — Consult Note (Signed)
[facility] SURGICAL CONSULTATION NOTE (initial) - cpt: [phone]   HISTORY OF PRESENT ILLNESS (HPI):  History is limited secondary to mental status and confusion. History primarily obtained through chart review and discussion with members of the medical team.   86 y.o. male with multiple significant comorbid conditions who presented to [facility] ED via EMS today for evaluation of abdominal pain. Patient reports a 3 day history of sharp lower abdominal pain. He reported this as being severe. Pain has reportedly been constant since the onset. He denied any associated fevers, chills, nausea, or emesis. Unable to illicit more history. He does have previous abdominal surgery evidence by his lower midline laparotomy scar however he is unable to illicit what was preformed (reportedly history of diverticulitis with previous bowel resection). Additionally he was admitted from 12/30 - 01/03 after being diagnosed with COVID. In the ED, he was found to have significant leukocytosis to 56.5K. CT scan of the abdomen was concerning for an 8 cm rim-enhancing air and fluid collection in the mid abdomen contiguous with the bladder dome worrisome for an enterovesical fistula. He was evaluated by urology in the ED (Dr Diamantina Providence) who exchanged and irrigated his foley catheter. He does have a history of MRSA and Pseudomonas UTI on 01/03 via [facility]. He was admitted to the medicine service for work up and management.   Surgery is consulted by hospitalist physician Dr. Jamse Arn in this context for evaluation and management of intra-abdominal abscess.   PAST MEDICAL HISTORY (PMH):  Past Medical History:  Diagnosis Date  . Atherosclerotic heart disease of native coronary artery without angina pectoris   . Benign prostatic hyperplasia without lower urinary tract symptoms   . Chronic diastolic (congestive) heart failure ([place])   . Hyperlipidemia   . Hypertensive heart disease with heart failure ([place])   . Peripheral  vascular disease ([place])   . Unspecified atrial fibrillation ([place])   . Unspecified dementia without behavioral disturbance (HCC)      PAST SURGICAL HISTORY ([place]):  History reviewed. No pertinent surgical history.   MEDICATIONS:  Prior to Admission medications   Medication Sig Start Date End Date Taking? Authorizing Provider  acetaminophen (TYLENOL) 325 MG tablet Take 650 mg by mouth every 4 (four) hours as needed for mild pain or fever.   Yes [provider]  albuterol (VENTOLIN HFA) 108 (90 Base) MCG/ACT inhaler Inhale 1 puff into the lungs every 12 (twelve) hours as needed for wheezing or shortness of breath.   Yes [provider]  amiodarone (PACERONE) 100 MG tablet Take 100 mg by mouth daily.   Yes [provider]  amLODipine (NORVASC) 5 MG tablet Take 5 mg by mouth daily.   Yes [provider]  apixaban (ELIQUIS) 2.5 MG TABS tablet Take 2.5 mg by mouth 2 (two) times daily.   Yes [provider]  dextromethorphan-guaiFENesin (TUSSIN DM) 10-100 MG/5ML liquid Take 10 mLs by mouth every 4 (four) hours as needed for cough.   Yes [provider]  feeding supplement, ENSURE ENLIVE, (ENSURE ENLIVE) LIQD Take 237 mLs by mouth 3 (three) times daily between meals. 01/03/20  Yes Danford, Suann Larry, MD  finasteride (PROSCAR) 5 MG tablet Take 1 tablet (5 mg total) by mouth daily. 01/03/20  Yes Danford, Suann Larry, MD  gabapentin (NEURONTIN) 100 MG capsule Take 100 mg by mouth 3 (three) times daily.   Yes [provider]  levETIRAcetam (KEPPRA) 250 MG tablet Take 500 mg by mouth every evening.   Yes [provider]  levETIRAcetam (KEPPRA) 250 MG tablet Take 250 mg by mouth daily.   Yes [provider]  losartan (COZAAR) 100 MG tablet Take 100 mg by mouth daily.   Yes [provider]  metoprolol succinate (TOPROL-XL) 25 MG 24 hr tablet Take 25 mg by mouth daily.   Yes [provider]  omeprazole  (PRILOSEC) 20 MG capsule Take 20 mg by mouth at bedtime.   Yes [provider]  polyethylene glycol (MIRALAX / GLYCOLAX) 17 g packet Take 17 g by mouth every other day.   Yes [provider]  tamsulosin (FLOMAX) 0.4 MG CAPS capsule Take 0.4 mg by mouth daily.   Yes [provider]  traMADol (ULTRAM) 50 MG tablet Take 1 tablet (50 mg total) by mouth every 6 (six) hours as needed for moderate pain. 01/03/20  Yes Danford, Suann Larry, MD     ALLERGIES:  No Known Allergies   SOCIAL HISTORY:  Social History   Socioeconomic History  . Marital status: Married    Spouse name: Not on file  . Number of children: Not on file  . Years of education: Not on file  . Highest education level: Not on file  Occupational History  . Not on file  Tobacco Use  . Smoking status: Former Research scientist (life sciences)  . Smokeless tobacco: Never Used  Substance and Sexual Activity  . Alcohol use: Not Currently  . Drug use: Never  . Sexual activity: Not on file  Other Topics Concern  . Not on file  Social History Narrative  . Not on file   Social Determinants of Health   Financial Resource Strain:   . Difficulty of Paying Living Expenses: Not on file  Food Insecurity:   . Worried About Charity fundraiser in the Last Year: Not on file  . Ran Out of Food in the Last Year: Not on file  Transportation Needs:   . Lack of Transportation (Medical): Not on file  . Lack of Transportation (Non-Medical): Not on file  Physical Activity:   . Days of Exercise per Week: Not on file  . Minutes of Exercise per Session: Not on file  Stress:   . Feeling of Stress : Not on file  Social Connections:   . Frequency of Communication with Friends and Family: Not on file  . Frequency of Social Gatherings with Friends and Family: Not on file  . Attends Religious Services: Not on file  . Active Member of Clubs or Organizations: Not on file  . Attends Archivist Meetings: Not on file  . Marital Status:  Not on file  Intimate Partner Violence:   . Fear of Current or Ex-Partner: Not on file  . Emotionally Abused: Not on file  . Physically Abused: Not on file  . Sexually Abused: Not on file     FAMILY HISTORY:  History reviewed. No pertinent family history.    REVIEW OF SYSTEMS:  Review of Systems  Unable to perform ROS: Mental acuity  Gastrointestinal: Positive for abdominal pain.    VITAL SIGNS:  Temp:  [97.9 F (36.6 C)-98.8 F (37.1 C)] 97.9 F (36.6 C) (01/08 0734) Pulse Rate:  [87-92] 91 (01/08 1000) Resp:  [20-26] 26 (01/08 0734) BP: (120-170)/(60-91) 170/69 (01/08 1000) SpO2:  [90 %-96 %] 95 % (01/08 1000) Weight:  [87.9 kg] 87.9 kg (01/08 0150)     Height: 6\' 4"  (193 cm) Weight: 87.9 kg BMI (Calculated): 23.6   INTAKE/OUTPUT:  No intake/output data  recorded.  PHYSICAL EXAM:  Physical Exam Vitals and nursing note reviewed. Exam conducted with a chaperone present.  Constitutional:      General: He is not in acute distress.    Appearance: He is well-developed and normal weight. He is not ill-appearing.  HENT:     Head: Normocephalic and atraumatic.  Cardiovascular:     Rate and Rhythm: Normal rate and regular rhythm.     Heart sounds: Normal heart sounds.  Pulmonary:     Effort: Pulmonary effort is normal.     Breath sounds: Normal breath sounds.  Abdominal:     General: A surgical scar is present. There is no distension.     Tenderness: There is abdominal tenderness in the right lower quadrant. There is no guarding or rebound.       Comments: His abdomen is soft, there is localized tenderness primarily in the suprapubic/RLQ, no rebound/guarding, previous surgical scar present  Genitourinary:    Comments: Foley catheter in place, hematuria in foley bag Skin:    General: Skin is warm and dry.     Coloration: Skin is not jaundiced or pale.  Neurological:     Mental Status: He is alert. Mental status is at baseline. He is confused.      Labs:  CBC  Latest Ref Rng & Units 01/20/2020 01/03/2020 01/02/2020  WBC 4.0 - 10.5 K/uL 56.5(HH) 12.1(H) 11.9(H)  Hemoglobin 13.0 - 17.0 g/dL 13.7 12.7(L) 13.0  Hematocrit 39.0 - 52.0 % 40.7 35.5(L) 39.3  Platelets 150 - 400 K/uL 214 287 322   CMP Latest Ref Rng & Units 01/26/2020 01/03/2020 01/02/2020  Glucose 70 - 99 mg/dL 85 121(H) 118(H)  BUN 8 - 23 mg/dL 20 23 26(H)  Creatinine 0.61 - 1.24 mg/dL 0.82 0.66 0.80  Sodium 135 - 145 mmol/L 139 139 140  Potassium 3.5 - 5.1 mmol/L 3.6 4.0 4.0  Chloride 98 - 111 mmol/L 106 110 111  CO2 22 - 32 mmol/L 21(L) 22 21(L)  Calcium 8.9 - 10.3 mg/dL 8.2(L) 8.3(L) 8.4(L)  Total Protein 6.5 - 8.1 g/dL 6.3(L) 6.3(L) 6.6  Total Bilirubin 0.3 - 1.2 mg/dL 1.1 0.8 0.8  Alkaline Phos 38 - 126 U/L 62 49 52  AST 15 - 41 U/L 18 22 27   ALT 0 - 44 U/L 20 22 24      Imaging studies:   CT Abdomen/Pelvis (01/07/2020) personally reviewed which does show intra-abdominal abscess near the bladder dome and loops of small bowel, and radiologist report reviewed below:  IMPRESSION: 1. 5.2 x 2.6 x 7.7cm rim enhancing air and fluid containing collection in the low anterior mid abdomen. Collection is directly contiguous with the inflamed bladder dome with several adjacent loops of edematous and thickened small bowel. Overall appearance is worrisome for an abscess with possible enterovesicular fistulization. 2. Bladder itself is circumferentially thickened with gas in the bladder wall. While these foci of air could be within granulations of the bladder given an enlarged prostate and likely some sequela of chronic outlet obstruction, given the patient's extreme white count features are highly suspicious for an emphysematous cystitis. 3. Inflated Foley catheter balloon is positioned within the prostatic urethra. Recommend repositioning or removal. 4. Rim enhancing low attenuation collection within the prostate parenchyma worrisome for potential abscess. 5. Mixed ground-glass, consolidative  and reticular opacities in the lower lobes, right middle lobe and lingula compatible with atypical infection in the setting of COVID positivity positivity. 6. Colonic diverticulosis without evidence of diverticulitis. 7. 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 8.  Aortic Atherosclerosis (ICD10-I70.0).    Assessment/Plan: (ICD-10's: N30.80) 86 y.o. 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    - Appreciate medicine admission   - From a surgical standpoint, unfortunately he is a very poor surgical candidate. I recommend discussion with IR regarding whether or not this abscess is amenable to percutaneous drainage --> will attempt to reach them this afternoon.   - Agree NPO + IVF resuscitation  - Continue broad spectrum ABx  - pain control prn  - monitor abdominal examiantion  - Further management per primary/consulting services; we will follow   All of the above findings and recommendations were discussed with the patient and the medical team  Thank you for the opportunity to participate in this patient's care.   -- Edison Simon, PA-C [facility] 01/15/2020, 1:49 PM [phone] M-F: 7am - 4pm

## 2020-01-08 NOTE — ED Notes (Signed)
This nurse and Gracie RN cleaned pt of stool with wipes. Changed pt undergarments and linens. Leg strap placed to hold urinary catheter on pt leg, hematuria noted in urine after placement of catheter by Dr. Alfred Levins, MD Damita Dunnings made aware. No redness or irritation to sacrum noted.

## 2020-01-08 NOTE — ED Notes (Signed)
Patient is alert and oriented to self. No dyspnea or wheezing noted.

## 2020-01-08 NOTE — Consult Note (Signed)
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 Alfred Levins.  He is an extremely comorbid 86 year old male who was brought to the emergency department from a facility with abdominal pain and hematuria.  Most of the history is gained from the chart, as the patient is minimally communicative.  His past medical history is significant for dementia, CAD, diastolic heart failure, hypertension, diverticulitis with prior bowel resection, greenlight laser for BPH over 10 years ago, peripheral vascular disease and A. fib on Eliquis.  He was recently hospitalized from 12/30/2019 to 01/03/2020 with COVID-19 pneumonia and hematuria.  Urology was not consulted at that time.  From chart review, he has been followed by a urologist at [facility] urology.  He originally presented on 12/09/2019 with acute onset of gross hematuria.  He was worked up with a CT urogram that was relatively benign showing only a very small left exophytic renal lesion concerning for possible indolent small RCC.  The prostate was enlarged with some asymmetric enhancement within the left prostate that was nonspecific.  He underwent a cystoscopy in clinic with them on 12/14/2019 that showed significant bladder trabeculation and a very enlarged prostate with a 5 cm prostatic length, but no bladder tumors, and the E flux was clear from both ureters.  The prostate was thought to be the source of his hematuria.  When he presented to the ER today there was a catheter in place that was not draining.  A CT scan showed an 8 cm rim-enhancing air and fluid collection in the mid abdomen contiguous with the bladder dome worrisome for an enterovesical fistula.  The bladder was thickened with gas in the bladder and possibly in the bladder wall, though these may  represent small diverticula.  On CT, the inflated Foley catheter balloon was malpositioned and located within prostate, and there was a small rim-enhancing lesion within the prostate parenchyma worrisome for a possible abscess.  Recent urine culture from 01/03/2020 showed MRSA and Pseudomonas.  In the ER today he is afebrile with stable vital signs, however significant leukocytosis to 56.5k.  His original Foley was removed, and a 24 [place] three-way catheter was placed by the ER with return of bloody urine.  PMH: Past Medical History:  Diagnosis Date  . Atherosclerotic heart disease of native coronary artery without angina pectoris   . Benign prostatic hyperplasia without lower urinary tract symptoms   . Chronic diastolic (congestive) heart failure ([place])   . Hyperlipidemia   . Hypertensive heart disease with heart failure ([place])   . Peripheral vascular disease ([place])   . Unspecified atrial fibrillation ([place])   . Unspecified dementia without behavioral disturbance ([place])     Surgical History: Greenlight laser for BPH > 10 years ago Bowel resection   Allergies: No Known Allergies  Family History: History reviewed. No pertinent family history.  Social History:  reports that he has quit smoking. He has never used smokeless tobacco. He reports previous alcohol use. He reports that he does not use drugs.  ROS: Negative aside from stated in the HPI   Physical Exam: BP 132/61   Pulse 90   Temp 97.9 F (36.6 C) (Oral)   Resp (!) 26   Ht 6\' 4"  (1.93 m)   Wt 87.9 kg   SpO2 91%   BMI 23.59 kg/m  Constitutional: Chronically ill-appearing, very frail, minimally communicative Cardiovascular: No clubbing, cyanosis, or edema. Respiratory: On oxygen GI: Abdomen is soft, tender throughout, non-peritonitic, diffusely tender in suprapubic region GU: 24 French three-way Foley in place with maroon-colored urine Lymph: No cervical or inguinal lymphadenopathy.  Laboratory Data: Reviewed,  see HPI  Pertinent Imaging: Reviewed, see HPI  Foley catheter placement: The 24 French three-way catheter was removed.  The patient was prepped and draped in standard sterile fashion, and a 20 [place] two-way coud catheter was passed into the bladder with return of pink urine.  The catheter irrigated easily with a [place] syringe.  10 cc were placed in the balloon.  500 mL of saline were used to irrigate the catheter and only a few small clots were removed.  Urine was faint pink to clear.  The catheter was secured and connected to dependent drainage.  Assessment & Plan:   To summarize, the patient is an extremely comorbid 86 year old male who is currently Covid positive with recent gross hematuria, UTI, and essentially negative gross hematuria work-up with an outside urologist.  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  With his extreme co-morbidities, anticoagulation, and current state with abscess and infection he would not be a candidate for any surgical exploration even if he had an intraperitoneal bladder injury, and I do not see a role for a cystogram at this time.  Recommendations:  1. Broad-spectrum antibiotics to cover his recent MRSA and Pseudomonas UTI 2. Maintain Foley to drainage, okay to irrigate as needed for hematuria or debris 3. Foley should stay in place for at least 2 to 3 weeks.  He can follow-up with his local urologist at [facility] for cystogram and Foley removal at that time, or with [place] in [place] if that is more convenient. 4. Recommend general surgery consult for consideration of percutaneous abscess drain to abdominal fluid collection  Urology will continue to follow Dr. Bernardo Heater will be following this patient over the weekend   B[place]ley [place],  [place] [address], [place], [place] [address] [phone]

## 2020-01-08 NOTE — ED Notes (Signed)
This nurse called pharmacy and requested to reschedule pt antibiotics for 0615 due to the timing of first dose of antibiotics.

## 2020-01-08 NOTE — Progress Notes (Signed)
Pharmacy Antibiotic Note  Jimmy Moore is a 86 y.o. 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 Patient admitted from skilled nursing facility. Patient with recent findings of MRSA and Pseudomonas in urine on 1/3 and treatment for COVID-19 pneumonia from 12/30-1/3. Pharmacy has been consulted for Vancomycin dosing. Patient also ordered Zosyn.   Plan: Vancomycin 1g IV x 1 ordered in the emergency department. Will order additional vancomycin 1g IV to complete loading dose of vancomycin 2g. Will continue patient on vancomycin 2g IV Q24hr. Patient currently on Zosyn, will obtain serum creatinine daily to monitor renal function. Will obtain peaks and troughs as clinically inidicated; anticipate with 5th dose.   Zosyn EI 3.375g IV Q8hr.   Per conversation with provider, will discontinue metronidazole.   Height: 6\' 4"  (193 cm) Weight: 193 lb 12.6 oz (87.9 kg) IBW/kg (Calculated) : 86.8  Temp (24hrs), Avg:98.4 F (36.9 C), Min:97.9 F (36.6 C), Max:98.8 F (37.1 C)  Recent Labs  Lab 01/02/20 0554 01/03/20 0746 01/28/2020 0232  WBC 11.9* 12.1* 56.5*  CREATININE 0.80 0.66 0.82  LATICACIDVEN  --   --  1.3    Estimated Creatinine Clearance: 76.4 mL/min (by C-G formula based on SCr of 0.82 mg/dL).    No Known Allergies  Antimicrobials this admission: Metronidazole 1/8 x 1 Zosyn 1/8 >>  Vancomycin 1/8 >>  Dose adjustments this admission: N/A  Microbiology results: 1/8 BCx: no growth < 24 hours  1/8 UCx: pending   Thank you for allowing pharmacy to be a part of this patient's care.  Ayari Liwanag L 01/14/2020 2:30 PM

## 2020-01-08 NOTE — ED Provider Notes (Signed)
[facility] Emergency Department Provider Note  ____________________________________________  Time seen: Approximately 4:15 AM  I have reviewed the triage vital signs and the nursing notes.   HISTORY  Chief Complaint Abdominal Pain  Level 5 caveat:  Portions of the history and physical were unable to be obtained due to dementia   HPI Jimmy Moore is a 86 y.o. male with a history of BPH with an indwelling catheter, hypertension, hyperlipidemia, peripheral vascular disease, dementia, A. fib, oxygen dependent since having Covid in the end of December who presents for evaluation of abdominal pain.  Patient reports 3 days of lower sharp abdominal pain which is currently severe.  Denies nausea or vomiting, constipation or diarrhea, dysuria, flank pain, chest pain or shortness of breath, fever or chills.   Past Medical History:  Diagnosis Date  . Atherosclerotic heart disease of native coronary artery without angina pectoris   . Benign prostatic hyperplasia without lower urinary tract symptoms   . Chronic diastolic (congestive) heart failure ([place])   . Hyperlipidemia   . Hypertensive heart disease with heart failure ([place])   . Peripheral vascular disease ([place])   . Unspecified atrial fibrillation ([place])   . Unspecified dementia without behavioral disturbance [facility])     Patient Active Problem List   Diagnosis Date Noted  . 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 History of COVID-19 01/09/2020  . Postprocedural intraabdominal abscess 01/19/2020  . Pressure injury of skin 01/03/2020  . Gross hematuria 12/30/2019  . Acute hypoxemic respiratory failure due to COVID-19 ([place]) 12/30/2019  . Hypertensive heart disease with heart failure ([place])   . Chronic atrial fibrillation ([place])   . Unspecified dementia without behavioral disturbance ([place])   . Peripheral vascular disease ([place])   . Hyperlipidemia   . Chronic diastolic (congestive) heart failure ([place])     History  reviewed. No pertinent surgical history.  Prior to Admission medications   Medication Sig Start Date End Date Taking? Authorizing Provider  acetaminophen (TYLENOL) 325 MG tablet Take 650 mg by mouth every 4 (four) hours as needed for mild pain or fever.    [provider]  albuterol (VENTOLIN HFA) 108 (90 Base) MCG/ACT inhaler Inhale 1 puff into the lungs every 12 (twelve) hours as needed for wheezing or shortness of breath.    [provider]  amiodarone (PACERONE) 100 MG tablet Take 100 mg by mouth daily.    [provider]  amLODipine (NORVASC) 5 MG tablet Take 5 mg by mouth daily.    [provider]  cephALEXin (KEFLEX) 500 MG capsule Take 1 capsule (500 mg total) by mouth every 8 (eight) hours for 5 days. 01/03/20 01/19/2020  Danford, Suann Larry, MD  dexamethasone (DECADRON) 2 MG tablet Take 6 mg for 1 day then take 4 mg for 1 day then take 2 mg for 1 day then stop 01/03/20   Danford, Suann Larry, MD  dextromethorphan-guaiFENesin (TUSSIN DM) 10-100 MG/5ML liquid Take 10 mLs by mouth every 4 (four) hours as needed for cough.    [provider]  feeding supplement, ENSURE ENLIVE, (ENSURE ENLIVE) LIQD Take 237 mLs by mouth 3 (three) times daily between meals. 01/03/20   Danford, Suann Larry, MD  finasteride (PROSCAR) 5 MG tablet Take 1 tablet (5 mg total) by mouth daily. 01/03/20   Danford, Suann Larry, MD  gabapentin (NEURONTIN) 100 MG capsule Take 100 mg by mouth 3 (three) times daily.    [provider]  levETIRAcetam (KEPPRA) 250 MG tablet Take 500 mg  by mouth every evening.    [provider]  levETIRAcetam (KEPPRA) 250 MG tablet Take 250 mg by mouth daily.    [provider]  losartan (COZAAR) 100 MG tablet Take 100 mg by mouth daily.    [provider]  metoprolol succinate (TOPROL-XL) 25 MG 24 hr tablet Take 25 mg by mouth daily.    [provider]  omeprazole (PRILOSEC) 20 MG capsule Take 20 mg by  mouth at bedtime.    [provider]  tamsulosin (FLOMAX) 0.4 MG CAPS capsule Take 0.4 mg by mouth daily.    [provider]  traMADol (ULTRAM) 50 MG tablet Take 1 tablet (50 mg total) by mouth every 6 (six) hours as needed for moderate pain. 01/03/20   Danford, Suann Larry, MD    Allergies Patient has no known allergies.  History reviewed. No pertinent family history.  Social History Social History   Tobacco Use  . Smoking status: Former Research scientist (life sciences)  . Smokeless tobacco: Never Used  Substance Use Topics  . Alcohol use: Not Currently  . Drug use: Never    Review of Systems  Constitutional: Negative for fever. Eyes: Negative for visual changes. ENT: Negative for sore throat. Neck: No neck pain  Cardiovascular: Negative for chest pain. Respiratory: Negative for shortness of breath. Gastrointestinal: + lower abdominal pain. Novomiting or diarrhea. Genitourinary: Negative for dysuria. Musculoskeletal: Negative for back pain. Skin: Negative for rash. Neurological: Negative for headaches, weakness or numbness. Psych: No SI or HI  ____________________________________________   PHYSICAL EXAM:  VITAL SIGNS: ED Triage Vitals  Enc Vitals Group     BP 01/29/2020 0149 (!) 152/76     Pulse Rate 01/10/2020 0149 92     Resp 01/25/2020 0149 20     Temp 01/22/2020 0149 98.8 F (37.1 C)     Temp Source 01/12/2020 0149 Oral     SpO2 01/18/2020 0149 90 %     Weight 01/13/2020 0150 193 lb 12.6 oz (87.9 kg)     Height 01/04/2020 0150 6\' 4"  (1.93 m)     Head Circumference --      Peak Flow --      Pain Score 01/20/2020 0150 8     Pain Loc --      Pain Edu? --      Excl. in [place]? --     Constitutional: Alert and oriented x 2, patient looks uncomfortable due to pain.  HEENT:      Head: Normocephalic and atraumatic.         Eyes: Conjunctivae are normal. Sclera is non-icteric.       Mouth/Throat: Mucous membranes are moist.       Neck: Supple with no signs of  meningismus. Cardiovascular: Regular rate and rhythm. No murmurs, gallops, or rubs. 2+ symmetrical distal pulses are present in all extremities. No JVD. Respiratory: Normal respiratory effort. Lungs are clear to auscultation bilaterally. No wheezes, crackles, or rhonchi.  Gastrointestinal: Soft, diffusely tender to palpation with positive bowel sounds.  Foley is draining blood-tinged urine.  Bladder scan showing 100 cc. Genitourinary: No CVA tenderness. Musculoskeletal: Nontender with normal range of motion in all extremities. No edema, cyanosis, or erythema of extremities. Neurologic: Normal speech and language. Face is symmetric. Moving all extremities. No gross focal neurologic deficits are appreciated. Skin: Skin is warm, dry and intact. No rash noted. Psychiatric: Mood and affect are normal. Speech and behavior are normal.  ____________________________________________   LABS (all labs ordered are listed, but only  abnormal results are displayed)  Labs Reviewed  CBC WITH DIFFERENTIAL/PLATELET - Abnormal; Notable for the following components:      Result Value   WBC 56.5 (*)    Neutro Abs 39.4 (*)    Monocytes Absolute 11.6 (*)    Abs Immature Granulocytes 4.44 (*)    All other components within normal limits  COMPREHENSIVE METABOLIC PANEL - Abnormal; Notable for the following components:   CO2 21 (*)    Calcium 8.2 (*)    Total Protein 6.3 (*)    Albumin 2.6 (*)    All other components within normal limits  URINALYSIS, COMPLETE (UACMP) WITH MICROSCOPIC - Abnormal; Notable for the following components:   Color, Urine AMBER (*)    APPearance CLOUDY (*)    Hgb urine dipstick LARGE (*)    Protein, ur 100 (*)    Leukocytes,Ua LARGE (*)    RBC / HPF >50 (*)    WBC, UA >50 (*)    Bacteria, UA RARE (*)    All other components within normal limits  URINE CULTURE  CULTURE, BLOOD (ROUTINE X 2)  CULTURE, BLOOD (ROUTINE X 2)  LACTIC ACID, PLASMA    ____________________________________________  EKG  ED ECG REPORT I, Rudene Re, the attending physician, personally viewed and interpreted this ECG.  Normal sinus rhythm, rate of 94, right bundle branch block, prolonged QTC, right axis deviation, no ST elevations.  There is no old EKG imaging available for comparison however review of epic shows an EKG from 11/2019 that reads as sinus bradycardia with a right bundle branch block. ____________________________________________  RADIOLOGY  I have personally reviewed the images performed during this visit and I agree with the Radiologist's read.   Interpretation by Radiologist:  CT ABDOMEN PELVIS W CONTRAST  Result Date: 01/07/2020 CLINICAL DATA:  Unspecified abdominal pain, COVID-19 positive EXAM: CT ABDOMEN AND PELVIS WITH CONTRAST TECHNIQUE: Multidetector CT imaging of the abdomen and pelvis was performed using the standard protocol following bolus administration of intravenous contrast. CONTRAST:  17mL OMNIPAQUE IOHEXOL 300 MG/ML  SOLN COMPARISON:  None. FINDINGS: Lower chest: Mixed ground-glass, consolidative and reticular opacities are present in the lower lobes, right middle lobe and lingula compatible with atypical infection in the setting of COVID-19 positivity. Mild airways thickening is present as well. Hepatobiliary: No focal liver abnormality is seen. No gallstones, gallbladder wall thickening, or biliary dilatation. Pancreas: Fatty replacement of the pancreas. No pancreatic ductal dilatation or surrounding inflammatory changes. Spleen: Normal in size without focal abnormality. 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 No worrisome adrenal lesions. Mild bilateral symmetric perinephric stranding, a nonspecific finding though may correlate with either age or decreased renal function. Kidneys enhance and excrete symmetrically.  Some mild left urothelial thickening and periureteral stranding is present. The urinary bladder is circumferentially thickened with multiple foci intraluminal gas. Several punctate foci of gas are seen within the wall of the bladder. These could feasibly reflect gas within small bladder diverticula or crenulation given an enlarged prostate and likely sequela of chronic outlet obstruction however emphysematous cystitis is not fully excluded. Furthermore, there is focal discontinuity along the dome of the bladder contiguous with a an irregular air and fluid containing collection in the midline abdomen closely apposed to several loops of thickened and enhancing small bowel. An enterovesicular fistula is suspected. Stomach/Bowel: Distal esophagus, stomach and duodenal sweep are unremarkable. There are few focally thickened segments  of small bowel with irregular mural enhancement and edematous change in the low anterior mid abdomen centered upon a rim enhancing 5.2 x 2.6 x 7.7cm air and fluid containing collection worrisome for developing abscess or potential enterovesicular fistula given direct continuity the dome of the bladder as detailed above. More distal small bowel has a normal appearance. Appendix is not well visualized. No focal pericecal inflammation is seen. Pancolonic diverticulosis without evidence of acute diverticulitis. Vascular/Lymphatic: Extensive atherosclerotic calcification of the abdominal aorta, iliac arteries and branch vessels. No aneurysm or ectasia is seen. Reactive adenopathy in the low mesentery with some geographic mesenteric stranding involving the affected bowel loops detailed above. Reproductive: The prostate is markedly enlarged. A Foley catheter appears inflated in the low prosthetic urethra. Some focal hypoattenuating cystic foci within the prostate could reflect possible abscess given the adjacent inflammation and stranding. Other: Phlegmonous change in the mid mesentery with the air  and fluid containing collection are arising from the dome of the bladder and or bowel loops. No free intraperitoneal air is seen. No bowel containing hernias. Musculoskeletal: Atrophy of the anterior abdominal wall musculature Multilevel degenerative changes are present in the imaged portions of the spine. No acute osseous abnormality or suspicious osseous lesion. Prior sternotomy changes are noted. Additional degenerative changes noted in the hips and SI joints bilaterally as well as the symphysis pubis. IMPRESSION: 1. 5.2 x 2.6 x 7.7cm rim enhancing air and fluid containing collection in the low anterior mid abdomen. Collection is directly contiguous with the inflamed bladder dome with several adjacent loops of edematous and thickened small bowel. Overall appearance is worrisome for an abscess with possible enterovesicular fistulization. 2. Bladder itself is circumferentially thickened with gas in the bladder wall. While these foci of air could be within granulations of the bladder given an enlarged prostate and likely some sequela of chronic outlet obstruction, given the patient's extreme white count features are highly suspicious for an emphysematous cystitis. 3. Inflated Foley catheter balloon is positioned within the prostatic urethra. Recommend repositioning or removal. 4. Rim enhancing low attenuation collection within the prostate parenchyma worrisome for potential abscess. 5. Mixed ground-glass, consolidative and reticular opacities in the lower lobes, right middle lobe and lingula compatible with atypical infection in the setting of COVID positivity positivity. 6. Colonic diverticulosis without evidence of diverticulitis. 7. 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 8.  Aortic Atherosclerosis (ICD10-I70.0). These results were called by telephone at the time of interpretation on 01/07/2020 at 4:46 am to provider [facility] , who verbally acknowledged these results.  Electronically Signed   By: Lovena Le M.D.   On: 01/24/2020 04:47      ____________________________________________   PROCEDURES  Procedure(s) performed: yes BLADDER CATHETERIZATION  Date/Time: 01/20/2020 6:52 AM Performed by: Rudene Re, MD Authorized by: Rudene Re, MD   Consent:    Consent obtained:  Verbal   Consent given by:  Patient   Risks discussed:  False passage, infection, urethral injury, incomplete procedure and pain   Alternatives discussed:  Delayed treatment Pre-procedure details:    Procedure purpose:  Therapeutic   Preparation: Patient was prepped and draped in usual sterile fashion   Anesthesia (see MAR for exact dosages):    Anesthesia method:  Topical application   Topical anesthetic:  Lidocaine gel Procedure details:    Provider performed due to:  Complicated insertion   Catheter insertion:  Indwelling   Catheter type:  Coude   Catheter size:  20  Fr   Bladder irrigation: no     Number of attempts:  1   Urine characteristics:  Bloody Post-procedure details:    Patient tolerance of procedure:  Tolerated well, no immediate complications       Critical Care performed: yes  CRITICAL CARE Performed by: Rudene Re  ?  Total critical care time: 40 min  Critical care time was exclusive of separately billable procedures and treating other patients.  Critical care was necessary to treat or prevent imminent or life-threatening deterioration.  Critical care was time spent personally by me on the following activities: development of treatment plan with patient and/or surrogate as well as nursing, discussions with consultants, evaluation of patient's response to treatment, examination of patient, obtaining history from patient or surrogate, ordering and performing treatments and interventions, ordering and review of laboratory studies, ordering and review of radiographic studies, pulse oximetry and re-evaluation of patient's  condition.  ____________________________________________   INITIAL IMPRESSION / ASSESSMENT AND PLAN / ED COURSE  86 y.o. male with a history of BPH with an indwelling catheter, hypertension, hyperlipidemia, peripheral vascular disease, dementia, A. fib, oxygen dependent since having Covid in the end of December who presents for evaluation of abdominal pain  Differential is broad and includes SBO versus constipation versus kidney stone versus pyelonephritis versus UTI versus diverticulitis versus versus gallbladder versus pancreatitis versus malignancy versus volvulus.  Per review of epic patient seems to have had a colectomy for colon polyps and appendectomy in the past.  We will check labs, urinalysis and CT abdomen pelvis.  Bedside bladder scan showing 100 cc in Foley seems to be draining urine.    _________________________ 6:53 AM on 01/29/2020 -----------------------------------------  Patient CT scan is concerning for bladder abscess, prostate abscess, the Foley catheter seems to be with the balloon inflated through the prostatic urethra.  His white count is 56 but otherwise no fever or tachycardia.  I discussed with Dr. Diamantina Providence from urology who recommended removing current Foley catheter and placing a 20 [place] two-way coud catheter. Due to complex pathology the catheter was placed by me with no difficulty.  Urology recommended admission to the hospitalist service for IV antibiotics and consult with IR for possible IR drainage.  Patient is not a surgical candidate.  Patient received Flagyl, Zosyn, and vancomycin.  Discussed with Dr. Damita Dunnings for admission.    As part of my medical decision making, I reviewed the following data within the [place] notes reviewed and incorporated, Labs reviewed , Old chart reviewed, Radiograph reviewed , Discussed with admitting physician , A consult was requested and obtained from this/these consultant(s) Urology, Notes from prior  ED visits and [place] Controlled Substance Database   Please note:  Patient was evaluated in Emergency Department today for the symptoms described in the history of present illness. Patient was evaluated in the context of the global COVID-19 pandemic, which necessitated consideration that the patient might be at risk for infection with the SARS-CoV-2 virus that causes COVID-19. Institutional protocols and algorithms that pertain to the evaluation of patients at risk for COVID-19 are in a state of rapid change based on information released by regulatory bodies including the CDC and federal and state organizations. These policies and algorithms were followed during the patient's care in the ED.  Some ED evaluations and interventions may be delayed as a result of limited staffing during the pandemic.   ____________________________________________   FINAL CLINICAL IMPRESSION(S) / ED DIAGNOSES   Final diagnoses:  Emphysematous cystitis  Prostate abscess  Abscess of bladder      NEW MEDICATIONS STARTED DURING THIS VISIT:  ED Discharge Orders    None       Note:  This document was prepared using Dragon voice recognition software and may include unintentional dictation errors.    Alfred Levins, [place], MD 01/11/2020 [phone]

## 2020-01-08 NOTE — ED Notes (Signed)
This nurse performed a bladder scan on pt, and located 150 mL of fluid in bladder.

## 2020-01-08 NOTE — ED Triage Notes (Signed)
Pt arrives via [place] from [facility]. Reports that the pt has experienced increasing pain in his abdomen, states he feels like his abdomen is twisting. Tenderness present upon palpation.

## 2020-01-08 NOTE — Procedures (Signed)
Pre procedural Dx: Abdominal abscess Post procedural Dx: Same  Technically successful CT guided placed of a 10 Fr drainage catheter placement into the air and fluid containing collection within the abdomen yielding 10 cc of blood tinged serous fluid.    A representative aspirated sample was capped and sent to the laboratory for analysis.    EBL: None Complications: None immediate  Ronny Bacon, MD Pager #: [phone]

## 2020-01-09 DIAGNOSIS — I4811 Longstanding persistent atrial fibrillation: Secondary | ICD-10-CM

## 2020-01-09 DIAGNOSIS — I110 Hypertensive heart disease with heart failure: Secondary | ICD-10-CM

## 2020-01-09 LAB — COMPREHENSIVE METABOLIC PANEL
ALT: 14 U/L (ref 0–44)
AST: 14 U/L — ABNORMAL LOW (ref 15–41)
Albumin: 2.1 g/dL — ABNORMAL LOW (ref 3.5–5.0)
Alkaline Phosphatase: 62 U/L (ref 38–126)
Anion gap: 10 (ref 5–15)
BUN: 19 mg/dL (ref 8–23)
CO2: 20 mmol/L — ABNORMAL LOW (ref 22–32)
Calcium: 7.7 mg/dL — ABNORMAL LOW (ref 8.9–10.3)
Chloride: 106 mmol/L (ref 98–111)
Creatinine, Ser: 0.79 mg/dL (ref 0.61–1.24)
GFR calc Af Amer: >60 mL/min (ref >60)
GFR calc non Af Amer: >60 mL/min (ref >60)
Glucose, Bld: 77 mg/dL (ref 70–99)
Potassium: 3.6 mmol/L (ref 3.5–5.1)
Sodium: 136 mmol/L (ref 135–145)
Total Bilirubin: 1 mg/dL (ref 0.3–1.2)
Total Protein: 5.3 g/dL — ABNORMAL LOW (ref 6.5–8.1)

## 2020-01-09 LAB — CBC WITH DIFFERENTIAL/PLATELET
Abs Immature Granulocytes: 2.46 10*3/uL — ABNORMAL HIGH (ref 0.00–0.07)
Basophils Absolute: 0.1 10*3/uL (ref 0.0–0.1)
Basophils Relative: 0 %
Eosinophils Absolute: 0 10*3/uL (ref 0.0–0.5)
Eosinophils Relative: 0 %
HCT: 35.8 % — ABNORMAL LOW (ref 39.0–52.0)
Hemoglobin: 11.6 g/dL — ABNORMAL LOW (ref 13.0–17.0)
Immature Granulocytes: 7 %
Lymphocytes Relative: 2 %
Lymphs Abs: 0.8 10*3/uL (ref 0.7–4.0)
MCH: 30.4 pg (ref 26.0–34.0)
MCHC: 32.4 g/dL (ref 30.0–36.0)
MCV: 93.7 fL (ref 80.0–100.0)
Monocytes Absolute: 6.1 10*3/uL — ABNORMAL HIGH (ref 0.1–1.0)
Monocytes Relative: 17 %
Neutro Abs: 26.6 10*3/uL — ABNORMAL HIGH (ref 1.7–7.7)
Neutrophils Relative %: 74 %
Platelets: 151 10*3/uL (ref 150–400)
RBC: 3.82 MIL/uL — ABNORMAL LOW (ref 4.22–5.81)
RDW: 13.9 % (ref 11.5–15.5)
Smear Review: NORMAL
WBC: 36.1 10*3/uL — ABNORMAL HIGH (ref 4.0–10.5)
nRBC: 0 % (ref 0.0–0.2)

## 2020-01-09 MED ORDER — SODIUM CHLORIDE 0.9 % IV SOLN
INTRAVENOUS | Status: DC | PRN
  Administered 2020-01-09 – 2020-01-13 (×5): 250 mL via INTRAVENOUS
  Administered 2020-01-15: 09:00:00 500 mL via INTRAVENOUS
  Administered 2020-01-17: 250 mL via INTRAVENOUS
  Administered 2020-01-17: 15:00:00 5 mL/h via INTRAVENOUS
  Administered 2020-01-19: 22:00:00 250 mL via INTRAVENOUS

## 2020-01-09 MED ORDER — APIXABAN 2.5 MG PO TABS
2.5000 mg | ORAL_TABLET | Freq: Two times a day (BID) | ORAL | Status: DC
  Administered 2020-01-10 – 2020-01-20 (×22): 2.5 mg via ORAL
  Filled 2020-01-09 (×23): qty 1

## 2020-01-09 MED ORDER — CHLORHEXIDINE GLUCONATE CLOTH 2 % EX PADS
6.0000 | MEDICATED_PAD | Freq: Every day | CUTANEOUS | Status: DC
  Administered 2020-01-09 – 2020-01-17 (×9): 6 via TOPICAL

## 2020-01-09 NOTE — Progress Notes (Signed)
No issues overnight with catheter drainage or recurrent hematuria.   Percutaneous catheter placed into abscess cavity by IR.  VSS, afebrile  Recommendations: -As per Dr. Doristine Counter note 1/8.  Will need indwelling Foley for 2-3 weeks.  Irrigate catheter as needed.  Please call for any problems with catheter drainage or recurrent hematuria.

## 2020-01-09 NOTE — Progress Notes (Signed)
PROGRESS NOTE    Jimmy Moore  [id number]  DOB: 1929/02/11  DOA: 01/07/2020 PCP: Venia Carbon, MD Outpatient Specialists:   Hospital course:  Patient is an 86 year old man with past medical history significant for CAD, HFpEF, HTN, PVD, A. 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  Patient returned to the hospital 01/07/2020 complaining of sharp lower abdominal pain.  Work-up revealed 8 cm rim-enhancing air and fluid collection in the mid abdomen contiguous with the bladder concerning for enterovesical fistula.  Patient is now admitted for treatment of abdominal mass.  Subjective:  Patient sleeping, arousable by touch but not inclined to speak.  Admits to abdominal pain if he tries to move.  Objective: Vitals:   01/09/20 0010 01/09/20 0443 01/09/20 0808 01/09/20 1210  BP: 140/63 122/63 (!) 158/59 (!) 122/53  Pulse: 80 73 79 77  Resp: (!) 22 20  20   Temp: (!) 97.5 F (36.4 C) 98.8 F (37.1 C)  (!) 97.5 F (36.4 C)  TempSrc: Oral Oral  Oral  SpO2: 97% 94%  91%  Weight:      Height:        Intake/Output Summary (Last 24 hours) at 01/09/2020 1504 Last data filed at 01/09/2020 0800 Gross per 24 hour  Intake 1576.68 ml  Output 750 ml  Net 826.68 ml   Filed Weights   01/07/2020 0150 01/09/20 0009  Weight: 87.9 kg 83.6 kg    Exam:  General: Frail elderly man lying flat his back with percutaneous drain with less than 10 cc of cloudy serosanguineous drainage. Eyes: sclera anicteric, sunken eye sockets CVS: [id number] clear 2/6 systolic murmur. Respiratory: Decreased air entry bilaterally secondary to decreased inspiratory effort. GI: Patient does have bowel sounds.  Percutaneous drain in right lower periumbilical area draining less than 5 cc cloudy serosanguineous material.  He does have tenderness to palpation in his bilateral lower quadrants right greater than left  although it appears to be less than yesterday.  Less rebound but it still present focal. LE: No edema. 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    Intraabdominal abscess Status post percutaneous drain placement yesterday 01/09/2020 by IR with drainage of less than 5 cc so far. Patient is on Zosyn and vancomycin day #3--follow creatinine. He is afebrile with decreasing WBC count. Definitive therapy may be difficult in this frail elderly patient. Patient is DNR.   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 Continue vancomycin and Zosyn follow patient clinically. Need to reimage patient to see if cystitis/possible prostate abscess are improving.  Marked leukocytosis Improved after drain was placed today, now down to 36.  History of COVID-19 pneumonia Diagnosed with Covid on 12/30/2019, s/p  remdesivir and steroids discharged 01/01/2020.   Ongoing groundglass opacities in bilateral lower lobes and right middle lobe on CT is most likely Covid inflammation. Patient is presently not hypoxic no steroids have been started.   In addition would hesitate starting steroids in this patient given ongoing fulminant infections.  Atrial fibrillation, chronic Rate normal Restart Eliquis tomorrow morning. Continue amiodarone, metoprolol for rate control  Coronary disease, secondary prevention Hypertension Chronic diastolic  CHF Euvolemic, blood pressure normal Continue amlodipine, metoprolol Hold losartan for now, rate can re-add back if blood pressure warrants. Patient does not appear to be on antiplatelet agent at home.   BPH Patient had Foley placed by urology, will need to be in for 3 weeks with  outpatient follow-up with urology. Continue tamsulosin and finasteride  GERD Continue pantoprazole  Seizure disorder Continues Keppra   Dementia without behavioral disturbance Monitor for behavioral disturbance  High risk of malnutrition He has moderate muscle mass and fat loss.   DVT prophylaxis: Patient getting abdominal instrumentation today, will restart Eliquis in the morning Code Status: DNR Family Communication: We will call and speak with his wife Disposition Plan: TBD   Consultants:  Urology  General surgery  IR  Procedures:  Placement of abdominal abscess drain percutaneously by IR  Antimicrobials:  Vancomycin  Zosyn  Data Reviewed: Basic Metabolic Panel: Recent Labs  Lab 01/03/20 0746 01/18/2020 0232 01/09/20 0608  NA 139 139 136  K 4.0 3.6 3.6  CL 110 106 106  CO2 22 21* 20*  GLUCOSE 121* 85 77  BUN 23 20 19   CREATININE 0.66 0.82 0.79  CALCIUM 8.3* 8.2* 7.7*  MG 1.8  --   --   PHOS 2.4*  --   --    Liver Function Tests: Recent Labs  Lab 01/03/20 0746 01/04/2020 0232 01/09/20 0608  AST 22 18 14*  ALT 22 20 14   ALKPHOS 49 62 62  BILITOT 0.8 1.1 1.0  PROT 6.3* 6.3* 5.3*  ALBUMIN 2.5* 2.6* 2.1*   No results for input(s): LIPASE, AMYLASE in the last 168 hours. No results for input(s): AMMONIA in the last 168 hours. CBC: Recent Labs  Lab 01/03/20 0746 01/05/2020 0232 01/09/20 0608  WBC 12.1* 56.5* 36.1*  NEUTROABS 8.2* 39.4* 26.6*  HGB 12.7* 13.7 11.6*  HCT 35.5* 40.7 35.8*  MCV 86.6 91.1 93.7  PLT 287 214 151   Cardiac Enzymes: No results for input(s): CKTOTAL, CKMB, CKMBINDEX, TROPONINI in the last 168 hours. BNP (last 3 results) No results for input(s): PROBNP in the last 8760 hours. CBG: No results for input(s): GLUCAP in the last 168 hours.  Recent Results (from the past 240 hour(s))  MRSA PCR Screening     Status: Abnormal   Collection Time: 01/01/20  6:00 AM   Specimen: Nasal Mucosa; Nasopharyngeal  Result  Value Ref Range Status   MRSA by PCR POSITIVE (A) NEGATIVE Final    Comment:        The GeneXpert MRSA Assay (FDA approved for NASAL specimens only), is one component of a comprehensive MRSA colonization surveillance program. It is not intended to diagnose MRSA infection nor to guide or monitor treatment for MRSA infections. RESULT CALLED TO, READ BACK BY AND VERIFIED WITH: BETH SMITH AT [id number] ON 01/01/2020 [place]. Performed at [facility], [address] [place], [place] [address]   Urine Culture     Status: Abnormal   Collection Time: 01/03/20 10:36 AM   Specimen: Urine, Random  Result Value Ref Range Status   Specimen Description   Final    URINE, RANDOM Performed at [facility], [address] [place], [place] [address]    Special Requests   Final    NONE Performed at [facility], [address] [place], [place] [address]    Culture (A)  Final    70,000 COLONIES/mL METHICILLIN RESISTANT STAPHYLOCOCCUS AUREUS >=100,000 COLONIES/mL PSEUDOMONAS AERUGINOSA    Report Status 01/05/2020 FINAL  Final   Organism ID, Bacteria METHICILLIN RESISTANT STAPHYLOCOCCUS AUREUS (A)  Final   Organism ID, Bacteria PSEUDOMONAS AERUGINOSA (A)  Final      Susceptibility   Methicillin resistant staphylococcus aureus - MIC*    CIPROFLOXACIN >=8 RESISTANT Resistant     GENTAMICIN <=0.5 SENSITIVE Sensitive     NITROFURANTOIN <=16 SENSITIVE Sensitive     OXACILLIN >=4 RESISTANT Resistant     TETRACYCLINE <=1 SENSITIVE Sensitive     VANCOMYCIN 1 SENSITIVE Sensitive     TRIMETH/SULFA <=10 SENSITIVE Sensitive     CLINDAMYCIN >=8 RESISTANT Resistant     RIFAMPIN <=0.5 SENSITIVE Sensitive     Inducible Clindamycin NEGATIVE Sensitive     * 70,000 COLONIES/mL METHICILLIN RESISTANT STAPHYLOCOCCUS AUREUS   Pseudomonas aeruginosa - MIC*    CEFTAZIDIME 2 SENSITIVE Sensitive     CIPROFLOXACIN 0.5 SENSITIVE Sensitive     GENTAMICIN <=1 SENSITIVE Sensitive     IMIPENEM >=16  RESISTANT Resistant     PIP/TAZO <=4 SENSITIVE Sensitive     CEFEPIME 2 SENSITIVE Sensitive     * >=100,000 COLONIES/mL PSEUDOMONAS AERUGINOSA  Urine culture     Status: Abnormal (Preliminary result)   Collection Time: 01/17/2020  2:32 AM   Specimen: Urine, Random  Result Value Ref Range Status   Specimen Description   Final    URINE, RANDOM Performed at [facility], [address], [place], [place] [address]    Special Requests   Final    NONE Performed at [facility], [address] [place], [place] [address]    Culture (A)  Final    >=100,000 COLONIES/mL PSEUDOMONAS AERUGINOSA SUSCEPTIBILITIES TO FOLLOW Performed at [facility], [address] [place], [place] [address]    Report Status PENDING  Incomplete  Blood culture (routine x 2)     Status: None (Preliminary result)   Collection Time: 01/10/2020  5:11 AM   Specimen: BLOOD  Result Value Ref Range Status   Specimen Description BLOOD LEFT ASSIST CONTROL  Final   Special Requests   Final    BOTTLES DRAWN AEROBIC AND ANAEROBIC Blood Culture adequate volume   Culture   Final    NO GROWTH 1 DAY Performed at [facility], [address] [place], [place] [address]    Report Status PENDING  Incomplete  Blood culture (routine x 2)     Status: None (Preliminary result)   Collection Time: 01/23/2020  5:12 AM   Specimen: BLOOD  Result Value Ref Range Status   Specimen Description BLOOD RIGHT ASSIST CONTROL  Final   Special Requests   Final    BOTTLES DRAWN AEROBIC AND ANAEROBIC Blood Culture adequate volume   Culture   Final    NO GROWTH 1 DAY Performed at [facility], [address] [place], [place] [address]    Report Status PENDING  Incomplete  Aerobic/Anaerobic Culture (surgical/deep wound)     Status: None (Preliminary result)   Collection Time: 01/16/2020  5:30 PM   Specimen: Abscess  Result Value Ref Range Status   Specimen Description ABSCESS DRAINAGE  Final   Special  Requests NONE  Final   Gram Stain   Final    ABUNDANT WBC PRESENT, PREDOMINANTLY PMN NO ORGANISMS SEEN    Culture   Final    FEW GRAM NEGATIVE RODS CULTURE REINCUBATED FOR BETTER GROWTH Performed at [facility], [place] [address], [place], [place] [address]    Report Status PENDING  Incomplete  Studies: CT ABDOMEN PELVIS W CONTRAST  Result Date: 01/16/2020 CLINICAL DATA:  Unspecified abdominal pain, COVID-19 positive EXAM: CT ABDOMEN AND PELVIS WITH CONTRAST TECHNIQUE: Multidetector CT imaging of the abdomen and pelvis was performed using the standard protocol following bolus administration of intravenous contrast. CONTRAST:  126mL OMNIPAQUE IOHEXOL 300 MG/ML  SOLN COMPARISON:  None. FINDINGS: Lower chest: Mixed ground-glass, consolidative and reticular opacities are present in the lower lobes, right middle lobe and lingula compatible with atypical infection in the setting of COVID-19 positivity. Mild airways thickening is present as well. Hepatobiliary: No focal liver abnormality is seen. No gallstones, gallbladder wall thickening, or biliary dilatation. Pancreas: Fatty replacement of the pancreas. No pancreatic ductal dilatation or surrounding inflammatory changes. Spleen: Normal in size without focal abnormality. 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 No worrisome adrenal lesions. Mild bilateral symmetric perinephric stranding, a nonspecific finding though may correlate with either age or decreased renal function. Kidneys enhance and excrete symmetrically. Some mild left urothelial thickening and periureteral stranding is present. The urinary bladder is circumferentially thickened with multiple foci intraluminal gas. Several punctate foci of gas are seen within the wall of the bladder. These could feasibly reflect gas within small bladder diverticula or crenulation given an  enlarged prostate and likely sequela of chronic outlet obstruction however emphysematous cystitis is not fully excluded. Furthermore, there is focal discontinuity along the dome of the bladder contiguous with a an irregular air and fluid containing collection in the midline abdomen closely apposed to several loops of thickened and enhancing small bowel. An enterovesicular fistula is suspected. Stomach/Bowel: Distal esophagus, stomach and duodenal sweep are unremarkable. There are few focally thickened segments of small bowel with irregular mural enhancement and edematous change in the low anterior mid abdomen centered upon a rim enhancing 5.2 x 2.6 x 7.7cm air and fluid containing collection worrisome for developing abscess or potential enterovesicular fistula given direct continuity the dome of the bladder as detailed above. More distal small bowel has a normal appearance. Appendix is not well visualized. No focal pericecal inflammation is seen. Pancolonic diverticulosis without evidence of acute diverticulitis. Vascular/Lymphatic: Extensive atherosclerotic calcification of the abdominal aorta, iliac arteries and branch vessels. No aneurysm or ectasia is seen. Reactive adenopathy in the low mesentery with some geographic mesenteric stranding involving the affected bowel loops detailed above. Reproductive: The prostate is markedly enlarged. A Foley catheter appears inflated in the low prosthetic urethra. Some focal hypoattenuating cystic foci within the prostate could reflect possible abscess given the adjacent inflammation and stranding. Other: Phlegmonous change in the mid mesentery with the air and fluid containing collection are arising from the dome of the bladder and or bowel loops. No free intraperitoneal air is seen. No bowel containing hernias. Musculoskeletal: Atrophy of the anterior abdominal wall musculature Multilevel degenerative changes are present in the imaged portions of the spine. No acute osseous  abnormality or suspicious osseous lesion. Prior sternotomy changes are noted. Additional degenerative changes noted in the hips and SI joints bilaterally as well as the symphysis pubis. IMPRESSION: 1. 5.2 x 2.6 x 7.7cm rim enhancing air and fluid containing collection in the low anterior mid abdomen. Collection is directly contiguous with the inflamed bladder dome with several adjacent loops of edematous and thickened small bowel. Overall appearance is worrisome for an abscess with possible enterovesicular fistulization. 2. Bladder itself is circumferentially thickened with gas in the bladder wall. While these  foci of air could be within granulations of the bladder given an enlarged prostate and likely some sequela of chronic outlet obstruction, given the patient's extreme white count features are highly suspicious for an emphysematous cystitis. 3. Inflated Foley catheter balloon is positioned within the prostatic urethra. Recommend repositioning or removal. 4. Rim enhancing low attenuation collection within the prostate parenchyma worrisome for potential abscess. 5. Mixed ground-glass, consolidative and reticular opacities in the lower lobes, right middle lobe and lingula compatible with atypical infection in the setting of COVID positivity positivity. 6. Colonic diverticulosis without evidence of diverticulitis. 7. 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 8.  Aortic Atherosclerosis (ICD10-I70.0). These results were called by telephone at the time of interpretation on 01/20/2020 at 4:46 am to provider [facility] , who verbally acknowledged these results. Electronically Signed   By: Lovena Le M.D.   On: 01/05/2020 04:47        Scheduled Meds: . amiodarone  100 mg Oral Daily  . amLODipine  5 mg Oral Daily  . Chlorhexidine Gluconate Cloth  6 each Topical Daily  . finasteride  5 mg Oral Daily  . gabapentin  100 mg Oral TID  . levETIRAcetam  250 mg Oral Daily  .  levETIRAcetam  500 mg Oral QPM  . metoprolol succinate  25 mg Oral Daily  . pantoprazole  40 mg Oral Daily  . sodium chloride flush  5 mL Intracatheter Q8H  . tamsulosin  0.4 mg Oral Daily   Continuous Infusions: . sodium chloride 100 mL/hr at 01/09/20 1339  . sodium chloride 250 mL (01/09/20 0514)  . piperacillin-tazobactam (ZOSYN)  IV 3.375 g (01/09/20 1342)  . vancomycin 2,000 mg (01/09/20 1044)    Principal Problem:   Postprocedural intraabdominal abscess Active Problems:   Hypertensive heart disease with heart failure (HCC)   Unspecified atrial fibrillation (HCC)   Unspecified dementia without behavioral disturbance (HCC)   Chronic diastolic (congestive) heart failure (HCC)   Emphysematous cystitis   History of COVID-19    Time spent: [place], MD, FACP, [facility]. [facility]  If 7PM-7AM, please contact night-coverage [email or web address] Password [email or web address] 01/09/2020, 3:04 PM    LOS: 1 day

## 2020-01-10 LAB — URINE CULTURE: Culture: >=100000 — AB

## 2020-01-10 LAB — CBC
HCT: 34.2 % — ABNORMAL LOW (ref 39.0–52.0)
Hemoglobin: 11.4 g/dL — ABNORMAL LOW (ref 13.0–17.0)
MCH: 30.8 pg (ref 26.0–34.0)
MCHC: 33.3 g/dL (ref 30.0–36.0)
MCV: 92.4 fL (ref 80.0–100.0)
Platelets: 134 10*3/uL — ABNORMAL LOW (ref 150–400)
RBC: 3.7 MIL/uL — ABNORMAL LOW (ref 4.22–5.81)
RDW: 13.6 % (ref 11.5–15.5)
WBC: 26.8 10*3/uL — ABNORMAL HIGH (ref 4.0–10.5)
nRBC: 0 % (ref 0.0–0.2)

## 2020-01-10 LAB — BASIC METABOLIC PANEL
Anion gap: 12 (ref 5–15)
BUN: 15 mg/dL (ref 8–23)
CO2: 18 mmol/L — ABNORMAL LOW (ref 22–32)
Calcium: 7.4 mg/dL — ABNORMAL LOW (ref 8.9–10.3)
Chloride: 104 mmol/L (ref 98–111)
Creatinine, Ser: 0.76 mg/dL (ref 0.61–1.24)
GFR calc Af Amer: >60 mL/min (ref >60)
GFR calc non Af Amer: >60 mL/min (ref >60)
Glucose, Bld: 63 mg/dL — ABNORMAL LOW (ref 70–99)
Potassium: 3.6 mmol/L (ref 3.5–5.1)
Sodium: 134 mmol/L — ABNORMAL LOW (ref 135–145)

## 2020-01-10 MED ORDER — LACTATED RINGERS IV SOLN: INTRAVENOUS | Status: DC

## 2020-01-10 NOTE — Progress Notes (Signed)
CC: Colovesical fistula Subjective: No major changes.  He continues to be encephalopathic .  White count is going down. He   Is becoming more acidotic. He is growing Pseudomonas and staph aureus from his aspiration.  Pending susceptibilities.  Objective: Vital signs in last 24 hours: Temp:  [97.6 F (36.4 C)-99.1 F (37.3 C)] 97.6 F (36.4 C) (01/10 1232) Pulse Rate:  [70-73] 72 (01/10 1232) Resp:  [18-28] 18 (01/10 1232) BP: (120-161)/(61-64) 120/61 (01/10 1232) SpO2:  [95 %-96 %] 95 % (01/10 1232)    Intake/Output from previous day: 01/09 0701 - 01/10 0700 In: 2738.8 [P.O.:100; I.V.:2117.5; IV Piggyback:521.3] Out: 920 [Urine:850; Drains:70] Intake/Output this shift: Total I/O In: 876.1 [I.V.:801.6; IV Piggyback:74.4] Out: 10 [Drains:10]  Physical exam:  Debilitated male in no acute distress. He is somnolent and not answering questions. Abd: Soft nontender drain in place with serous fluid.  No peritonitis  Lab Results: CBC  Recent Labs    01/09/20 0608 01/10/20 0515  WBC 36.1* 26.8*  HGB 11.6* 11.4*  HCT 35.8* 34.2*  PLT 151 134*   BMET Recent Labs    01/09/20 0608 01/10/20 0515  NA 136 134*  K 3.6 3.6  CL 106 104  CO2 20* 18*  GLUCOSE 77 63*  BUN 19 15  CREATININE 0.79 0.76  CALCIUM 7.7* 7.4*   PT/INR No results for input(s): LABPROT, INR in the last 72 hours. ABG No results for input(s): PHART, HCO3 in the last 72 hours.  Invalid input(s): PCO2, PO2  Studies/Results: No results found.  Anti-infectives: Anti-infectives (From admission, onward)   Start     Dose/Rate Route Frequency Ordered Stop   01/09/20 1000  vancomycin (VANCOREADY) IVPB 2000 mg/400 mL     2,000 mg 200 mL/hr over 120 Minutes Intravenous Every 24 hours 01/20/2020 0954     01/09/20 0800  vancomycin (VANCOCIN) IVPB 1000 mg/200 mL premix  Status:  Discontinued     1,000 mg 200 mL/hr over 60 Minutes Intravenous Every 24 hours 01/04/2020 0832 01/29/2020 2022   01/20/2020 2030   vancomycin (VANCOCIN) IVPB 1000 mg/200 mL premix     1,000 mg 200 mL/hr over 60 Minutes Intravenous  Once 01/07/2020 2022 01/27/2020 2142   01/12/2020 1400  metroNIDAZOLE (FLAGYL) IVPB 500 mg  Status:  Discontinued     500 mg 100 mL/hr over 60 Minutes Intravenous Every 8 hours 01/26/2020 0832 01/26/2020 0835   01/16/2020 1400  piperacillin-tazobactam (ZOSYN) IVPB 3.375 g     3.375 g 12.5 mL/hr over 240 Minutes Intravenous Every 8 hours 01/07/2020 0832     01/01/2020 1000  vancomycin (VANCOCIN) IVPB 1000 mg/200 mL premix  Status:  Discontinued     1,000 mg 200 mL/hr over 60 Minutes Intravenous  Once 01/11/2020 0954 01/05/2020 2022   01/05/2020 0615  piperacillin-tazobactam (ZOSYN) IVPB 3.375 g  Status:  Discontinued     3.375 g 100 mL/hr over 30 Minutes Intravenous Every 8 hours 01/05/2020 0610 01/05/2020 0832   01/02/2020 0615  metroNIDAZOLE (FLAGYL) IVPB 500 mg  Status:  Discontinued     500 mg 100 mL/hr over 60 Minutes Intravenous Every 8 hours 01/17/2020 0610 01/23/2020 0832   01/07/2020 0615  vancomycin (VANCOCIN) IVPB 1000 mg/200 mL premix  Status:  Discontinued     1,000 mg 200 mL/hr over 60 Minutes Intravenous Every 24 hours 01/29/2020 0610 01/20/2020 0832   01/26/2020 0500  piperacillin-tazobactam (ZOSYN) IVPB 3.375 g     3.375 g 100 mL/hr over 30 Minutes Intravenous  Once 01/02/2020  [id number] 01/02/2020 0558   01/15/2020 0500  vancomycin (VANCOCIN) IVPB 1000 mg/200 mL premix     1,000 mg 200 mL/hr over 60 Minutes Intravenous  Once 01/05/2020 0449 01/06/2020 1318   01/29/2020 0500  metroNIDAZOLE (FLAGYL) IVPB 500 mg     500 mg 100 mL/hr over 60 Minutes Intravenous  Once 01/12/2020 0449 01/30/2020 [id number]      Assessment/Plan:  Colovesical fistula and abscess in debilitated 86 year old male with some dementia and acute encephalopathy.  Very poor prognosis.  Recommend continuation of IV antibiotics, Foley and drain.  We will be happy to follow him in the outpatient basis.  He is a very poor surgical candidate and unfortunately there are  no good options.  I recommend potential palliative care if family is in agreement. Note that I spent at least 25 minutes in this encounter with greater than 50% spent in coordination and counseling of his care Caroleen Hamman, MD, [facility]  01/10/2020

## 2020-01-10 NOTE — Progress Notes (Signed)
PROGRESS NOTE    Jimmy Moore  [id number]  DOB: Jul 15, 1929  DOA: 01/27/2020 PCP: Venia Carbon, MD Outpatient Specialists:   Hospital course:  Patient is an 86 year old man with past medical history significant for CAD, HFpEF, HTN, PVD, A. 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  Patient returned to the hospital 01/07/2020 complaining of sharp lower abdominal pain.  Work-up revealed 8 cm rim-enhancing air and fluid collection in the mid abdomen contiguous with the bladder concerning for enterovesical fistula.  Patient is now admitted for treatment of abdominal mass.  Subjective:  Patient is awake and was eating breakfast.  He states he is not very hungry but is trying to eat a little bit.  Admits to ongoing abdominal pain.  Admits to generally feeling unwell.   Objective: Vitals:   01/09/20 2119 01/09/20 2140 01/10/20 0547 01/10/20 1232  BP: (!) 146/64  (!) 161/64 120/61  Pulse: 73  70 72  Resp: (!) 28 20 (!) 22 18  Temp: 99.1 F (37.3 C)  98.8 F (37.1 C) 97.6 F (36.4 C)  TempSrc: Oral  Oral Oral  SpO2: 96%  95% 95%  Weight:      Height:        Intake/Output Summary (Last 24 hours) at 01/10/2020 1340 Last data filed at 01/10/2020 1011 Gross per 24 hour  Intake 3514.89 ml  Output 930 ml  Net 2584.89 ml   Filed Weights   01/10/2020 0150 01/09/20 0009  Weight: 87.9 kg 83.6 kg    Exam:  General: Frail elderly man lying flat his back with percutaneous drain with 2-3 cc of cloudy serosanguineous drainage. Eyes: sclera anicteric, sunken eye sockets CVS: [id number] clear 2/6 systolic murmur. Respiratory: Decreased air entry bilaterally secondary to decreased inspiratory effort. GI: Patient does have bowel sounds.  Percutaneous drain in right lower periumbilical area draining less than 5 cc cloudy serosanguineous material.  Abdomen remains soft.  He appears to be  less tender throughout but does have continued tenderness in the right lower quadrant and suprapubic area.  Rebound tenderness seems to be improved as well though. LE: No edema. 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  Patient is now growing out Pseudomonas from his urine as well as from the abscess area.  It is sensitive to Zosyn.  Intraabdominal abscess Status post percutaneous drain placement yesterday 01/19/2020 by IR with drainage of less than 5 cc so far. Patient is growing out Pseudomonas from culture. Patient is on Zosyn and vancomycin day #4-follow creatinine. He is afebrile with decreasing WBC count. Definitive therapy may be difficult in this frail elderly patient, as reflected in today general surgery note.Patient is DNR.   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 Continue vancomycin and Zosyn follow patient clinically. Patient is growing out Pseudomonas from urine. Consider reimaging patient to see if cystitis/possible prostate abscess are improving.  Marked leukocytosis Improved after percutaneous drainage 56--36--26 today  Long-term plans Spoke with daughter Jimmy Moore yesterday who states she understands that he is very sick and she feels she is realistic about  the possibility of his not recovering.  She notes that prior to his cystoscopy a month ago at [facility], patient was doing very well and trading stocks daily.  She notes also that dementia is listed in the problem list however he does not have dementia. Will call daughter today to discuss longer-term plans as surgery and  urology are pursuing a more conservative route given his multiple comorbidities and how frail he is. The meanwhile we will continue the antibiotics with Pseudomonas and MRSA coverage and keep drain in place for now.  History of COVID-19 pneumonia Diagnosed with Covid on 12/30/2019, s/p  remdesivir and steroids discharged 01/01/2020.   Ongoing groundglass opacities in bilateral lower lobes and right middle lobe on CT is most likely Covid inflammation. Patient is presently not hypoxic no steroids have been started.   In addition would hesitate starting steroids in this patient given ongoing fulminant infections.  Atrial fibrillation, chronic Rate normal Restart Eliquis tomorrow morning. Continue amiodarone, metoprolol for rate control  Coronary disease, secondary prevention Hypertension Chronic diastolic CHF Euvolemic, blood pressure normal Continue amlodipine, metoprolol Hold losartan for now, rate can re-add back if blood pressure warrants. Patient does not appear to be on antiplatelet agent at home.   BPH Patient had Foley placed by urology, will need to be in for 3 weeks with outpatient follow-up with urology. Continue tamsulosin and finasteride  GERD Continue pantoprazole  Seizure disorder Continues Keppra   Dementia without behavioral disturbance Monitor for behavioral disturbance  High risk of malnutrition He has moderate muscle mass and fat loss.   DVT prophylaxis: Patient getting abdominal instrumentation today, will restart Eliquis in the morning Code Status: DNR Family Communication: Called and spoke with his daughter Jimmy Moore yesterday. Disposition Plan: TBD   Consultants:  Urology  General surgery  IR  Procedures:  Placement of abdominal abscess drain percutaneously by IR  Antimicrobials:  Vancomycin  Zosyn  Data Reviewed: Basic Metabolic Panel: Recent Labs  Lab 01/01/2020 0232 01/09/20 0608 01/10/20 0515  NA 139 136 134*  K 3.6  3.6 3.6  CL 106 106 104  CO2 21* 20* 18*  GLUCOSE 85 77 63*  BUN 20 19 15   CREATININE 0.82 0.79 0.76  CALCIUM 8.2* 7.7* 7.4*   Liver Function Tests: Recent Labs  Lab 01/23/2020 0232 01/09/20 0608  AST 18 14*  ALT 20 14  ALKPHOS 62 62  BILITOT 1.1 1.0  PROT 6.3* 5.3*  ALBUMIN 2.6* 2.1*   No results for input(s): LIPASE, AMYLASE in the last 168 hours. No results for input(s): AMMONIA in the last 168 hours. CBC: Recent Labs  Lab 01/20/2020 0232 01/09/20 0608 01/10/20 0515  WBC 56.5* 36.1* 26.8*  NEUTROABS 39.4* 26.6*  --   HGB 13.7 11.6* 11.4*  HCT 40.7 35.8* 34.2*  MCV 91.1 93.7 92.4  PLT 214 151 134*   Cardiac Enzymes: No results for input(s): CKTOTAL, CKMB, CKMBINDEX, TROPONINI in the last 168 hours. BNP (last 3 results) No results for input(s): PROBNP in the last 8760 hours. CBG: No results for input(s): GLUCAP in the last 168 hours.  Recent Results (from the past 240 hour(s))  MRSA PCR Screening     Status: Abnormal   Collection Time: 01/01/20  6:00 AM   Specimen: Nasal Mucosa; Nasopharyngeal  Result Value Ref Range Status   MRSA by PCR POSITIVE (A) NEGATIVE Final    Comment:        The GeneXpert MRSA Assay (FDA approved for NASAL specimens only), is one component  of a comprehensive MRSA colonization surveillance program. It is not intended to diagnose MRSA infection nor to guide or monitor treatment for MRSA infections. RESULT CALLED TO, READ BACK BY AND VERIFIED WITH: BETH SMITH AT [id number] ON 01/01/2020 [place]. Performed at [facility], [address] [place], [place] [address]   Urine Culture     Status: Abnormal   Collection Time: 01/03/20 10:36 AM   Specimen: Urine, Random  Result Value Ref Range Status   Specimen Description   Final    URINE, RANDOM Performed at [facility], [address], [place], [place] [address]    Special Requests   Final    NONE Performed at [facility], [address] [place], [place]  [address]    Culture (A)  Final    70,000 COLONIES/mL METHICILLIN RESISTANT STAPHYLOCOCCUS AUREUS >=100,000 COLONIES/mL PSEUDOMONAS AERUGINOSA    Report Status 01/05/2020 FINAL  Final   Organism ID, Bacteria METHICILLIN RESISTANT STAPHYLOCOCCUS AUREUS (A)  Final   Organism ID, Bacteria PSEUDOMONAS AERUGINOSA (A)  Final      Susceptibility   Methicillin resistant staphylococcus aureus - MIC*    CIPROFLOXACIN >=8 RESISTANT Resistant     GENTAMICIN <=0.5 SENSITIVE Sensitive     NITROFURANTOIN <=16 SENSITIVE Sensitive     OXACILLIN >=4 RESISTANT Resistant     TETRACYCLINE <=1 SENSITIVE Sensitive     VANCOMYCIN 1 SENSITIVE Sensitive     TRIMETH/SULFA <=10 SENSITIVE Sensitive     CLINDAMYCIN >=8 RESISTANT Resistant     RIFAMPIN <=0.5 SENSITIVE Sensitive     Inducible Clindamycin NEGATIVE Sensitive     * 70,000 COLONIES/mL METHICILLIN RESISTANT STAPHYLOCOCCUS AUREUS   Pseudomonas aeruginosa - MIC*    CEFTAZIDIME 2 SENSITIVE Sensitive     CIPROFLOXACIN 0.5 SENSITIVE Sensitive     GENTAMICIN <=1 SENSITIVE Sensitive     IMIPENEM >=16 RESISTANT Resistant     PIP/TAZO <=4 SENSITIVE Sensitive     CEFEPIME 2 SENSITIVE Sensitive     * >=100,000 COLONIES/mL PSEUDOMONAS AERUGINOSA  Urine culture     Status: Abnormal   Collection Time: 01/07/2020  2:32 AM   Specimen: Urine, Random  Result Value Ref Range Status   Specimen Description   Final    URINE, RANDOM Performed at [facility], [address] [place], [place] [address]    Special Requests   Final    NONE Performed at [facility], [address] [place], [place] [address]    Culture >=100,000 COLONIES/mL PSEUDOMONAS AERUGINOSA (A)  Final   Report Status 01/10/2020 FINAL  Final   Organism ID, Bacteria PSEUDOMONAS AERUGINOSA (A)  Final      Susceptibility   Pseudomonas aeruginosa - MIC*    CEFTAZIDIME 2 SENSITIVE Sensitive     CIPROFLOXACIN <=0.25 SENSITIVE Sensitive     GENTAMICIN <=1 SENSITIVE Sensitive      IMIPENEM >=16 RESISTANT Resistant     PIP/TAZO <=4 SENSITIVE Sensitive     CEFEPIME 2 SENSITIVE Sensitive     * >=100,000 COLONIES/mL PSEUDOMONAS AERUGINOSA  Blood culture (routine x 2)     Status: None (Preliminary result)   Collection Time: 01/20/2020  5:11 AM   Specimen: BLOOD  Result Value Ref Range Status   Specimen Description BLOOD LEFT ASSIST CONTROL  Final   Special Requests   Final    BOTTLES DRAWN AEROBIC AND ANAEROBIC Blood Culture adequate volume   Culture   Final    NO GROWTH 2 DAYS Performed at [facility], [address] [place], [place] [address]    Report Status PENDING  Incomplete  Blood culture (routine x 2)     Status: None (Preliminary result)   Collection Time: 01/25/2020  5:12 AM   Specimen: BLOOD  Result Value Ref Range Status   Specimen Description BLOOD RIGHT ASSIST CONTROL  Final   Special Requests   Final    BOTTLES DRAWN AEROBIC AND ANAEROBIC Blood Culture adequate volume   Culture   Final    NO GROWTH 2 DAYS Performed at [facility], [address] [place], [place] [address]    Report Status PENDING  Incomplete  Aerobic/Anaerobic Culture (surgical/deep wound)     Status: None (Preliminary result)   Collection Time: 01/20/2020  5:30 PM   Specimen: Abscess  Result Value Ref Range Status   Specimen Description ABSCESS DRAINAGE  Final   Special Requests NONE  Final   Gram Stain   Final    ABUNDANT WBC PRESENT, PREDOMINANTLY PMN NO ORGANISMS SEEN Performed at [facility], [place] [address], [place],  [address]    Culture   Final    FEW PSEUDOMONAS AERUGINOSA FEW STAPHYLOCOCCUS AUREUS SUSCEPTIBILITIES TO FOLLOW NO ANAEROBES ISOLATED; CULTURE IN PROGRESS FOR 5 DAYS    Report Status PENDING  Incomplete         Studies: No results found.      Scheduled Meds: . amiodarone  100 mg Oral Daily  . amLODipine  5 mg Oral Daily  . apixaban  2.5 mg Oral BID  . Chlorhexidine Gluconate Cloth  6 each Topical Daily    . finasteride  5 mg Oral Daily  . gabapentin  100 mg Oral TID  . levETIRAcetam  250 mg Oral Daily  . levETIRAcetam  500 mg Oral QPM  . metoprolol succinate  25 mg Oral Daily  . pantoprazole  40 mg Oral Daily  . sodium chloride flush  5 mL Intracatheter Q8H  . tamsulosin  0.4 mg Oral Daily   Continuous Infusions: . sodium chloride 100 mL/hr at 01/10/20 1011  . sodium chloride Stopped (01/10/20 1003)  . piperacillin-tazobactam (ZOSYN)  IV 3.375 g (01/10/20 1333)  . vancomycin 200 mL/hr at 01/10/20 1011    Principal Problem:   Postprocedural intraabdominal abscess Active Problems:   Hypertensive heart disease with heart failure (HCC)   Unspecified atrial fibrillation (HCC)   Unspecified dementia without behavioral disturbance (HCC)   Chronic diastolic (congestive) heart failure (HCC)   Emphysematous cystitis   History of COVID-19    Time spent: [place], MD, FACP, [facility]  If 7PM-7AM, please contact night-coverage [email or web address] Password [email or web address] 01/10/2020, 1:40 PM    LOS: 2 days

## 2020-01-11 DIAGNOSIS — G9341 Metabolic encephalopathy: Secondary | ICD-10-CM

## 2020-01-11 LAB — COMPREHENSIVE METABOLIC PANEL
ALT: 15 U/L (ref 0–44)
AST: 15 U/L (ref 15–41)
Albumin: 2 g/dL — ABNORMAL LOW (ref 3.5–5.0)
Alkaline Phosphatase: 48 U/L (ref 38–126)
Anion gap: 9 (ref 5–15)
BUN: 10 mg/dL (ref 8–23)
CO2: 20 mmol/L — ABNORMAL LOW (ref 22–32)
Calcium: 7.3 mg/dL — ABNORMAL LOW (ref 8.9–10.3)
Chloride: 105 mmol/L (ref 98–111)
Creatinine, Ser: 0.54 mg/dL — ABNORMAL LOW (ref 0.61–1.24)
GFR calc Af Amer: >60 mL/min (ref >60)
GFR calc non Af Amer: >60 mL/min (ref >60)
Glucose, Bld: 78 mg/dL (ref 70–99)
Potassium: 3.5 mmol/L (ref 3.5–5.1)
Sodium: 134 mmol/L — ABNORMAL LOW (ref 135–145)
Total Bilirubin: 1.1 mg/dL (ref 0.3–1.2)
Total Protein: 5.2 g/dL — ABNORMAL LOW (ref 6.5–8.1)

## 2020-01-11 LAB — CBC
HCT: 33.8 % — ABNORMAL LOW (ref 39.0–52.0)
Hemoglobin: 11.2 g/dL — ABNORMAL LOW (ref 13.0–17.0)
MCH: 30.3 pg (ref 26.0–34.0)
MCHC: 33.1 g/dL (ref 30.0–36.0)
MCV: 91.4 fL (ref 80.0–100.0)
Platelets: 142 10*3/uL — ABNORMAL LOW (ref 150–400)
RBC: 3.7 MIL/uL — ABNORMAL LOW (ref 4.22–5.81)
RDW: 13.3 % (ref 11.5–15.5)
WBC: 22.2 10*3/uL — ABNORMAL HIGH (ref 4.0–10.5)
nRBC: 0 % (ref 0.0–0.2)

## 2020-01-11 NOTE — Progress Notes (Signed)
PROGRESS NOTE    Jimmy Moore  [id number]  DOB: 1929/08/30  DOA: 01/15/2020 PCP: Venia Carbon, MD Outpatient Specialists:   Hospital course:  Patient is an 86 year old man with past medical history significant for CAD, HFpEF, HTN, PVD, A. 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  Patient returned to the hospital 01/07/2020 complaining of sharp lower abdominal pain.  Work-up revealed 8 cm rim-enhancing air and fluid collection in the mid abdomen contiguous with the bladder concerning for enterovesical fistula.  Patient is now admitted for treatment of abdominal mass.  Subjective:  Patient is much more awake and asks "How am I doing doc?"  Admits to ongoing abdominal pain.  Admits to being weak, has difficulty moving himself in bed.    Objective: Vitals:   01/10/20 1232 01/10/20 2006 01/11/20 0525 01/11/20 1206  BP: 120/61 133/68 (!) 175/67 (!) 144/72  Pulse: 72 73 81 89  Resp: 18  18 20   Temp: 97.6 F (36.4 C) 97.7 F (36.5 C) 97.9 F (36.6 C) 98.7 F (37.1 C)  TempSrc: Oral Oral Oral Oral  SpO2: 95% 92% 92% 93%  Weight:      Height:        Intake/Output Summary (Last 24 hours) at 01/11/2020 1726 Last data filed at 01/11/2020 1418 Gross per 24 hour  Intake 1425.89 ml  Output 650 ml  Net 775.89 ml   Filed Weights   01/19/2020 0150 01/09/20 0009  Weight: 87.9 kg 83.6 kg    Exam:  General: Frail elderly man lying at 20 degrees with percutaneous drain with 1-2 cc of clear serous drainage.  Eyes: sclera anicteric, sunken eye sockets CVS: [id number] clear 2/6 systolic murmur. Respiratory: Decreased air entry bilaterally secondary to decreased inspiratory effort. GI: +BS Abdomen remains soft. Percutaneous drain with scant clear serous drainage.  Diffuse tenderness has resolved.  He is essentially nontender in the upper quadrants or the left lower quadrant.  He  continues to have exquisite tenderness in his right lower quadrant where there is a firmness that is palpable.  He does have tenderness that is not quite so exquisite in his suprapubic area.  Rebound tenderness seems to have resolved except for and that very specific right lower quadrant area. LE: No edema. 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  Patient is now growing out Pseudomonas from his urine as well as from the abscess area.  It is sensitive to Zosyn.  Intraabdominal abscess Patient is clinically improving with decreased abdominal tenderness and what appears to be improved mental status/less encephalopathy. Drain placed by IR 01/24/2020 now has clear serous drainage which is scant. He is on day 5 of vancomycin and Zosyn, growing out Pseudomonas which is susceptible. WBC continues to decrease and patient remains afebrile. However patient does have that area of exquisite tenderness and firmness in the right lower quadrant which may be an as yet undrained abscess. We will discuss with IR tomorrow whether further imaging is warranted and whether another drain may need to be placed.  Longer-term plans Conversation with daughter [name] and her husband [name] who is a Software engineer about long-term plans for care for [name].  I noted  it would be very difficult to eradicate abscess without surgery and long-term outcome does not look good. She states she will discuss the situation with her brother and sisters but at this point to continue with imaging and treatment as we are doing.  She will bring up the possibility of palliative care with them. 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 As noted above, Zosyn sensitive Pseudomonas is growing out of urine. Urology is following.  Foley is to remain in place for 3 to 4 weeks.  Marked leukocytosis Improved after percutaneous drainage 56--36--26--22  today  History of COVID-19 pneumonia Diagnosed with Covid on 12/30/2019, s/p  remdesivir and steroids discharged 01/01/2020.   Ongoing groundglass opacities in bilateral lower lobes and right middle lobe on CT is most likely Covid inflammation. Patient is presently not hypoxic no steroids have been started.   In addition would hesitate starting steroids in this patient given ongoing fulminant infections.  Atrial fibrillation, chronic Rate normal Restart Eliquis tomorrow morning. Continue amiodarone, metoprolol for rate control  Coronary disease, secondary prevention Hypertension Chronic diastolic CHF Euvolemic, blood pressure normal Continue amlodipine, metoprolol Hold losartan for now, rate can re-add back if blood pressure warrants. Patient does not appear to be on antiplatelet agent at home.   BPH Patient had Foley placed by urology, will need to be in for 3 weeks with outpatient follow-up with urology. Continue tamsulosin and finasteride  GERD Continue pantoprazole  Seizure disorder Continues Keppra   Dementia without behavioral disturbance Monitor for behavioral disturbance  High risk of malnutrition He has moderate muscle mass and fat loss.   DVT prophylaxis: Patient getting abdominal instrumentation today, will restart Eliquis in the morning Code Status: DNR Family Communication: Called and spoke with his daughter [name] yesterday. Disposition Plan: TBD   Consultants:  Urology  General surgery  IR  Procedures:  Placement of abdominal abscess drain percutaneously by IR  Antimicrobials:  Vancomycin  Zosyn  Data Reviewed: Basic Metabolic Panel: Recent Labs  Lab  01/11/2020 0232 01/09/20 0608 01/10/20 0515 01/11/20 0611  NA 139 136 134* 134*  K 3.6 3.6 3.6 3.5  CL 106 106 104 105  CO2 21* 20* 18* 20*  GLUCOSE 85 77 63* 78  BUN 20 19 15 10   CREATININE 0.82 0.79 0.76 0.54*  CALCIUM 8.2* 7.7* 7.4* 7.3*   Liver Function Tests: Recent Labs  Lab 01/26/2020 0232 01/09/20 0608 01/11/20 0611  AST 18 14* 15  ALT 20 14 15   ALKPHOS 62 62 48  BILITOT 1.1 1.0 1.1  PROT 6.3* 5.3* 5.2*  ALBUMIN 2.6* 2.1* 2.0*   No results for input(s): LIPASE, AMYLASE in the last 168 hours. No results for input(s): AMMONIA in the last 168 hours. CBC: Recent Labs  Lab 01/07/2020 0232 01/09/20 0608 01/10/20 0515 01/11/20 0611  WBC 56.5* 36.1* 26.8* 22.2*  NEUTROABS 39.4* 26.6*  --   --   HGB 13.7 11.6* 11.4* 11.2*  HCT 40.7 35.8* 34.2* 33.8*  MCV 91.1 93.7 92.4 91.4  PLT 214 151 134* 142*   Cardiac Enzymes: No results for input(s): CKTOTAL, CKMB, CKMBINDEX, TROPONINI in the last 168 hours. BNP (last 3 results) No results for input(s): PROBNP in the last 8760 hours. CBG: No results for input(s): GLUCAP in the last 168 hours.  Recent Results (from the past 240 hour(s))  Urine Culture     Status: Abnormal   Collection Time: 01/03/20 10:36  AM   Specimen: Urine, Random  Result Value Ref Range Status   Specimen Description   Final    URINE, RANDOM Performed at [facility], [address] [place], [place] [address]    Special Requests   Final    NONE Performed at [facility], [address] [place], [place] [address]    Culture (A)  Final    70,000 COLONIES/mL METHICILLIN RESISTANT STAPHYLOCOCCUS AUREUS >=100,000 COLONIES/mL PSEUDOMONAS AERUGINOSA    Report Status 01/05/2020 FINAL  Final   Organism ID, Bacteria METHICILLIN RESISTANT STAPHYLOCOCCUS AUREUS (A)  Final   Organism ID, Bacteria PSEUDOMONAS AERUGINOSA (A)  Final      Susceptibility   Methicillin resistant staphylococcus aureus - MIC*    CIPROFLOXACIN >=8 RESISTANT  Resistant     GENTAMICIN <=0.5 SENSITIVE Sensitive     NITROFURANTOIN <=16 SENSITIVE Sensitive     OXACILLIN >=4 RESISTANT Resistant     TETRACYCLINE <=1 SENSITIVE Sensitive     VANCOMYCIN 1 SENSITIVE Sensitive     TRIMETH/SULFA <=10 SENSITIVE Sensitive     CLINDAMYCIN >=8 RESISTANT Resistant     RIFAMPIN <=0.5 SENSITIVE Sensitive     Inducible Clindamycin NEGATIVE Sensitive     * 70,000 COLONIES/mL METHICILLIN RESISTANT STAPHYLOCOCCUS AUREUS   Pseudomonas aeruginosa - MIC*    CEFTAZIDIME 2 SENSITIVE Sensitive     CIPROFLOXACIN 0.5 SENSITIVE Sensitive     GENTAMICIN <=1 SENSITIVE Sensitive     IMIPENEM >=16 RESISTANT Resistant     PIP/TAZO <=4 SENSITIVE Sensitive     CEFEPIME 2 SENSITIVE Sensitive     * >=100,000 COLONIES/mL PSEUDOMONAS AERUGINOSA  Urine culture     Status: Abnormal   Collection Time: 01/07/2020  2:32 AM   Specimen: Urine, Random  Result Value Ref Range Status   Specimen Description   Final    URINE, RANDOM Performed at [facility], [address], [place], [place] [address]    Special Requests   Final    NONE Performed at [facility], [address] [place], [place] [address]    Culture >=100,000 COLONIES/mL PSEUDOMONAS AERUGINOSA (A)  Final   Report Status 01/10/2020 FINAL  Final   Organism ID, Bacteria PSEUDOMONAS AERUGINOSA (A)  Final      Susceptibility   Pseudomonas aeruginosa - MIC*    CEFTAZIDIME 2 SENSITIVE Sensitive     CIPROFLOXACIN <=0.25 SENSITIVE Sensitive     GENTAMICIN <=1 SENSITIVE Sensitive     IMIPENEM >=16 RESISTANT Resistant     PIP/TAZO <=4 SENSITIVE Sensitive     CEFEPIME 2 SENSITIVE Sensitive     * >=100,000 COLONIES/mL PSEUDOMONAS AERUGINOSA  Blood culture (routine x 2)     Status: None (Preliminary result)   Collection Time: 01/17/2020  5:11 AM   Specimen: BLOOD  Result Value Ref Range Status   Specimen Description BLOOD LEFT ASSIST CONTROL  Final   Special Requests   Final    BOTTLES DRAWN AEROBIC AND  ANAEROBIC Blood Culture adequate volume   Culture   Final    NO GROWTH 3 DAYS Performed at [facility], [address] [place], [place] [address]    Report Status PENDING  Incomplete  Blood culture (routine x 2)     Status: None (Preliminary result)   Collection Time: 01/27/2020  5:12 AM   Specimen: BLOOD  Result Value Ref Range Status   Specimen Description BLOOD RIGHT ASSIST CONTROL  Final   Special Requests   Final    BOTTLES DRAWN  AEROBIC AND ANAEROBIC Blood Culture adequate volume   Culture   Final    NO GROWTH 3 DAYS Performed at [facility], [address] [place], [place] [address]    Report Status PENDING  Incomplete  Aerobic/Anaerobic Culture (surgical/deep wound)     Status: None (Preliminary result)   Collection Time: 01/06/2020  5:30 PM   Specimen: Abscess  Result Value Ref Range Status   Specimen Description ABSCESS DRAINAGE  Final   Special Requests NONE  Final   Gram Stain   Final    ABUNDANT WBC PRESENT, PREDOMINANTLY PMN NO ORGANISMS SEEN Performed at [facility], [place] [address] [place], [place] [address]    Culture   Final    FEW PSEUDOMONAS AERUGINOSA FEW METHICILLIN RESISTANT STAPHYLOCOCCUS AUREUS NO ANAEROBES ISOLATED; CULTURE IN PROGRESS FOR 5 DAYS    Report Status PENDING  Incomplete   Organism ID, Bacteria PSEUDOMONAS AERUGINOSA  Final   Organism ID, Bacteria METHICILLIN RESISTANT STAPHYLOCOCCUS AUREUS  Final      Susceptibility   Methicillin resistant staphylococcus aureus - MIC*    CIPROFLOXACIN >=8 RESISTANT Resistant     ERYTHROMYCIN >=8 RESISTANT Resistant     GENTAMICIN <=0.5 SENSITIVE Sensitive     OXACILLIN >=4 RESISTANT Resistant     TETRACYCLINE <=1 SENSITIVE Sensitive     VANCOMYCIN 1 SENSITIVE Sensitive     TRIMETH/SULFA <=10 SENSITIVE Sensitive     CLINDAMYCIN >=8 RESISTANT Resistant     RIFAMPIN <=0.5 SENSITIVE Sensitive     Inducible Clindamycin NEGATIVE Sensitive     * FEW METHICILLIN RESISTANT  STAPHYLOCOCCUS AUREUS   Pseudomonas aeruginosa - MIC*    CEFTAZIDIME 4 SENSITIVE Sensitive     CIPROFLOXACIN <=0.25 SENSITIVE Sensitive     GENTAMICIN <=1 SENSITIVE Sensitive     IMIPENEM >=16 RESISTANT Resistant     PIP/TAZO <=4 SENSITIVE Sensitive     CEFEPIME 2 SENSITIVE Sensitive     * FEW PSEUDOMONAS AERUGINOSA         Studies: No results found.      Scheduled Meds: . amiodarone  100 mg Oral Daily  . amLODipine  5 mg Oral Daily  . apixaban  2.5 mg Oral BID  . Chlorhexidine Gluconate Cloth  6 each Topical Daily  . finasteride  5 mg Oral Daily  . gabapentin  100 mg Oral TID  . levETIRAcetam  250 mg Oral Daily  . levETIRAcetam  500 mg Oral QPM  . metoprolol succinate  25 mg Oral Daily  . pantoprazole  40 mg Oral Daily  . sodium chloride flush  5 mL Intracatheter Q8H  . tamsulosin  0.4 mg Oral Daily   Continuous Infusions: . sodium chloride Stopped (01/11/20 0537)  . lactated ringers 100 mL/hr at 01/11/20 1427  . piperacillin-tazobactam (ZOSYN)  IV 3.375 g (01/11/20 1415)  . vancomycin 2,000 mg (01/11/20 1002)    Principal Problem:   Postprocedural intraabdominal abscess Active Problems:   Hypertensive heart disease with heart failure (HCC)   Unspecified atrial fibrillation (HCC)   Unspecified dementia without behavioral disturbance (HCC)   Chronic diastolic (congestive) heart failure (HCC)   Emphysematous cystitis   History of COVID-19    Time spent: [place], MD, FACP, [facility]. [facility]  If 7PM-7AM, please contact night-coverage [email or web address] Password [email or web address] 01/11/2020, 5:26 PM    LOS: 3 days

## 2020-01-12 LAB — COMPREHENSIVE METABOLIC PANEL
ALT: 17 U/L (ref 0–44)
AST: 16 U/L (ref 15–41)
Albumin: 1.9 g/dL — ABNORMAL LOW (ref 3.5–5.0)
Alkaline Phosphatase: 49 U/L (ref 38–126)
Anion gap: 7 (ref 5–15)
BUN: 9 mg/dL (ref 8–23)
CO2: 23 mmol/L (ref 22–32)
Calcium: 7.5 mg/dL — ABNORMAL LOW (ref 8.9–10.3)
Chloride: 104 mmol/L (ref 98–111)
Creatinine, Ser: 0.5 mg/dL — ABNORMAL LOW (ref 0.61–1.24)
GFR calc Af Amer: >60 mL/min (ref >60)
GFR calc non Af Amer: >60 mL/min (ref >60)
Glucose, Bld: 80 mg/dL (ref 70–99)
Potassium: 3.2 mmol/L — ABNORMAL LOW (ref 3.5–5.1)
Sodium: 134 mmol/L — ABNORMAL LOW (ref 135–145)
Total Bilirubin: 1.2 mg/dL (ref 0.3–1.2)
Total Protein: 5.1 g/dL — ABNORMAL LOW (ref 6.5–8.1)

## 2020-01-12 LAB — CBC
HCT: 33.6 % — ABNORMAL LOW (ref 39.0–52.0)
Hemoglobin: 11.3 g/dL — ABNORMAL LOW (ref 13.0–17.0)
MCH: 30.6 pg (ref 26.0–34.0)
MCHC: 33.6 g/dL (ref 30.0–36.0)
MCV: 91.1 fL (ref 80.0–100.0)
Platelets: 129 10*3/uL — ABNORMAL LOW (ref 150–400)
RBC: 3.69 MIL/uL — ABNORMAL LOW (ref 4.22–5.81)
RDW: 13.2 % (ref 11.5–15.5)
WBC: 21.9 10*3/uL — ABNORMAL HIGH (ref 4.0–10.5)
nRBC: 0 % (ref 0.0–0.2)

## 2020-01-12 LAB — VANCOMYCIN, PEAK: Vancomycin Pk: 37 ug/mL (ref 30–40)

## 2020-01-12 MED ORDER — BISACODYL 10 MG RE SUPP
10.0000 mg | Freq: Every day | RECTAL | Status: DC | PRN
  Administered 2020-01-12: 18:00:00 10 mg via RECTAL
  Filled 2020-01-12: qty 1

## 2020-01-12 MED ORDER — POTASSIUM CHLORIDE CRYS ER 20 MEQ PO TBCR
40.0000 meq | EXTENDED_RELEASE_TABLET | Freq: Once | ORAL | Status: AC
  Administered 2020-01-12: 11:00:00 40 meq via ORAL
  Filled 2020-01-12: qty 2

## 2020-01-12 NOTE — Progress Notes (Addendum)
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  Patient is now growing out Pseudomonas from his urine as well as from the abscess area.  It is sensitive to Zosyn.  Subjective:  Patient continues to appear somewhat stronger.  He is awake alert and conversant.  Excess abdominal pain might be a little better.  He has no appetite and declines to eat.  Discussed the importance of protein intake however he states he will not eat he is not hungry.  Objective: Vitals:   01/11/20 2043 01/12/20 0423 01/12/20 1252 01/12/20 1255  BP: (!) 146/67 (!) 158/71 (!) 144/81   Pulse: 69 79 87 96  Resp:  20    Temp: (!) 97.5 F (36.4 C) (!) 97.5 F (36.4 C) 97.8 F (36.6 C)   TempSrc: Oral Oral    SpO2: 91% 90% (!) 86% 90%  Weight:      Height:        Intake/Output Summary (Last 24 hours) at 01/12/2020 1615 Last data filed at 01/12/2020 1500 Gross per 24 hour  Intake 2371.12 ml  Output 1480 ml  Net 891.12 ml   Filed Weights   01/29/2020 0150 01/09/20 0009  Weight: 87.9 kg 83.6 kg    Exam:  General: Frail elderly man sitting up in front of on eaten breakfast asking me to remove his tray. Eyes: sclera anicteric, sunken eye sockets CVS: [id number] clear 2/6 systolic murmur. Respiratory: Decreased air entry bilaterally secondary to decreased inspiratory effort. GI: +BS Abdomen remains soft. Percutaneous drain with scant clear serous drainage.  Diffuse tenderness seems to have entirely resolved.  He has mild tenderness in the right lower quadrant which is much improved from yesterday.  Focal area of rebound  tenderness has also resolved.  I am also unable to feel that area of firmness that was present on the right lower quadrant.  All in all his abdominal exam is greatly improved from yesterday. LE: No edema. 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  Patient is now growing out Pseudomonas from his urine as well as from the abscess area.  It is sensitive to Zosyn.  Intraabdominal abscess Patient with significantly improved abdominal exam also complaining of less abdominal pain.   Discussed with IR earlier today who recommended keeping the drain in at least until 01/16/2020 if not longer.  They do believe there is some communication between the bladder and they think it needs to be there longer so that can heal up. No further imaging is recommended at this time unless his abdominal exam worsens. Focal area of tenderness of the right lower quadrant which is now resolved not correspond with any area of abscess on imaging. He is on day 6 of vancomycin and Zosyn, growing out Pseudomonas which is susceptible. WBC has come down significantly from a high of 56 but seems to be stable at 22 since yesterday.    Longer-term plans Conversation 01/11/20 with daughter Jimmy Moore and her husband Jimmy Moore who  is a Software engineer about long-term plans for care for Jimmy Moore.  I noted it could be very difficult to eradicate abscess without surgery and long-term outcome looks challenging. She states she will discuss the situation with her brother and sisters but at this point to continue with imaging and treatment as we are doing.  She right bring up the possibility of palliative care with them. On resection 01/12/2020 with son-in-law Jimmy Moore I updated him  about marked improvement in abdominal exam as well as recommendations of radiology to continue present course and no further recommendations for imaging.  He is concerned about patient's nutritional status and not wanting to eat which I concur with.  Will place dietary consult for possible supplements. 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 Urology is following.  Foley is to remain in place for 3 to 4 weeks.  Marked leukocytosis Improved after percutaneous drainage 56--36--26--22-22 today  History of COVID-19 pneumonia Diagnosed with Covid on 12/30/2019, s/p  remdesivir and steroids discharged 01/01/2020.   Ongoing groundglass opacities in bilateral lower lobes and right middle lobe on CT is most likely Covid inflammation. Patient is presently not hypoxic no steroids have been started.   In addition would hesitate starting steroids in this patient given ongoing fulminant infections.  Atrial fibrillation, chronic Rate normal Restart Eliquis tomorrow morning. Continue amiodarone, metoprolol for rate control  Coronary disease, secondary prevention Hypertension Chronic diastolic CHF Euvolemic, blood pressure normal Continue amlodipine, metoprolol Hold losartan for now, rate can re-add back if blood pressure warrants. Patient does not appear to be on antiplatelet agent at home.   BPH Patient had Foley placed by urology, will need to be in for 3 weeks with outpatient follow-up with urology. Continue tamsulosin and finasteride  GERD Continue pantoprazole  Seizure disorder Continues Keppra   Dementia without behavioral disturbance Monitor for behavioral disturbance  High risk of malnutrition He has moderate muscle mass and fat loss.   DVT prophylaxis: Patient getting abdominal instrumentation today, will restart  Eliquis in the morning Code Status: DNR Family Communication: Called and spoke with his daughter Jimmy Moore yesterday. Disposition Plan: TBD   Consultants:  Urology  General surgery  IR  Procedures:  Placement of abdominal abscess drain percutaneously by IR  Antimicrobials:  Vancomycin  Zosyn  Data Reviewed: Basic Metabolic Panel: Recent Labs  Lab 01/24/2020 0232 01/09/20 0608 01/10/20 0515 01/11/20 0611 01/12/20 0505  NA 139 136 134* 134* 134*  K 3.6 3.6 3.6 3.5 3.2*  CL 106 106 104 105 104  CO2 21* 20* 18* 20* 23  GLUCOSE 85 77 63* 78 80  BUN 20 19 15 10 9   CREATININE 0.82 0.79 0.76 0.54* 0.50*  CALCIUM 8.2* 7.7* 7.4* 7.3* 7.5*   Liver Function Tests: Recent Labs  Lab 01/25/2020 0232 01/09/20 0608 01/11/20 0611 01/12/20 0505  AST 18 14* 15 16  ALT 20 14 15 17   ALKPHOS 62 62 48 49  BILITOT 1.1 1.0 1.1 1.2  PROT 6.3* 5.3* 5.2* 5.1*  ALBUMIN 2.6* 2.1* 2.0* 1.9*   No results for input(s): LIPASE, AMYLASE in the last 168 hours. No results for input(s): AMMONIA in the last 168 hours. CBC: Recent Labs  Lab 01/24/2020 0232 01/09/20 0608 01/10/20 0515 01/11/20 0611 01/12/20 0505  WBC 56.5* 36.1* 26.8* 22.2* 21.9*  NEUTROABS 39.4* 26.6*  --   --   --  HGB 13.7 11.6* 11.4* 11.2* 11.3*  HCT 40.7 35.8* 34.2* 33.8* 33.6*  MCV 91.1 93.7 92.4 91.4 91.1  PLT 214 151 134* 142* 129*   Cardiac Enzymes: No results for input(s): CKTOTAL, CKMB, CKMBINDEX, TROPONINI in the last 168 hours. BNP (last 3 results) No results for input(s): PROBNP in the last 8760 hours. CBG: No results for input(s): GLUCAP in the last 168 hours.  Recent Results (from the past 240 hour(s))  Urine Culture     Status: Abnormal   Collection Time: 01/03/20 10:36 AM   Specimen: Urine, Random  Result Value Ref Range Status   Specimen Description   Final    URINE, RANDOM Performed at [facility], [address], [place], [place] [address]    Special Requests   Final     NONE Performed at [facility], [address] [place], [place], [place] [address]    Culture (A)  Final    70,000 COLONIES/mL METHICILLIN RESISTANT STAPHYLOCOCCUS AUREUS >=100,000 COLONIES/mL PSEUDOMONAS AERUGINOSA    Report Status 01/05/2020 FINAL  Final   Organism ID, Bacteria METHICILLIN RESISTANT STAPHYLOCOCCUS AUREUS (A)  Final   Organism ID, Bacteria PSEUDOMONAS AERUGINOSA (A)  Final      Susceptibility   Methicillin resistant staphylococcus aureus - MIC*    CIPROFLOXACIN >=8 RESISTANT Resistant     GENTAMICIN <=0.5 SENSITIVE Sensitive     NITROFURANTOIN <=16 SENSITIVE Sensitive     OXACILLIN >=4 RESISTANT Resistant     TETRACYCLINE <=1 SENSITIVE Sensitive     VANCOMYCIN 1 SENSITIVE Sensitive     TRIMETH/SULFA <=10 SENSITIVE Sensitive     CLINDAMYCIN >=8 RESISTANT Resistant     RIFAMPIN <=0.5 SENSITIVE Sensitive     Inducible Clindamycin NEGATIVE Sensitive     * 70,000 COLONIES/mL METHICILLIN RESISTANT STAPHYLOCOCCUS AUREUS   Pseudomonas aeruginosa - MIC*    CEFTAZIDIME 2 SENSITIVE Sensitive     CIPROFLOXACIN 0.5 SENSITIVE Sensitive     GENTAMICIN <=1 SENSITIVE Sensitive     IMIPENEM >=16 RESISTANT Resistant     PIP/TAZO <=4 SENSITIVE Sensitive     CEFEPIME 2 SENSITIVE Sensitive     * >=100,000 COLONIES/mL PSEUDOMONAS AERUGINOSA  Urine culture     Status: Abnormal   Collection Time: 01/20/2020  2:32 AM   Specimen: Urine, Random  Result Value Ref Range Status   Specimen Description   Final    URINE, RANDOM Performed at [facility], [address], [place], [place] [address]    Special Requests   Final    NONE Performed at [facility], [address] [place], [place] [address]    Culture >=100,000 COLONIES/mL PSEUDOMONAS AERUGINOSA (A)  Final   Report Status 01/10/2020 FINAL  Final   Organism ID, Bacteria PSEUDOMONAS AERUGINOSA (A)  Final      Susceptibility   Pseudomonas aeruginosa - MIC*    CEFTAZIDIME 2 SENSITIVE Sensitive      CIPROFLOXACIN <=0.25 SENSITIVE Sensitive     GENTAMICIN <=1 SENSITIVE Sensitive     IMIPENEM >=16 RESISTANT Resistant     PIP/TAZO <=4 SENSITIVE Sensitive     CEFEPIME 2 SENSITIVE Sensitive     * >=100,000 COLONIES/mL PSEUDOMONAS AERUGINOSA  Blood culture (routine x 2)     Status: None (Preliminary result)   Collection Time: 01/25/2020  5:11 AM   Specimen: BLOOD  Result Value Ref Range Status   Specimen Description BLOOD LEFT ASSIST CONTROL  Final   Special Requests   Final    BOTTLES DRAWN AEROBIC AND  ANAEROBIC Blood Culture adequate volume   Culture   Final    NO GROWTH 4 DAYS Performed at [facility], [address] [place], [place] [address]    Report Status PENDING  Incomplete  Blood culture (routine x 2)     Status: None (Preliminary result)   Collection Time: 01/15/2020  5:12 AM   Specimen: BLOOD  Result Value Ref Range Status   Specimen Description BLOOD RIGHT ASSIST CONTROL  Final   Special Requests   Final    BOTTLES DRAWN AEROBIC AND ANAEROBIC Blood Culture adequate volume   Culture   Final    NO GROWTH 4 DAYS Performed at [facility], [address], [place], [place] [address]    Report Status PENDING  Incomplete  Aerobic/Anaerobic Culture (surgical/deep wound)     Status: None (Preliminary result)   Collection Time: 01/10/2020  5:30 PM   Specimen: Abscess  Result Value Ref Range Status   Specimen Description ABSCESS DRAINAGE  Final   Special Requests NONE  Final   Gram Stain   Final    ABUNDANT WBC PRESENT, PREDOMINANTLY PMN NO ORGANISMS SEEN Performed at [facility], [place] [address] [place], [place] [address]    Culture   Final    FEW PSEUDOMONAS AERUGINOSA FEW METHICILLIN RESISTANT STAPHYLOCOCCUS AUREUS NO ANAEROBES ISOLATED; CULTURE IN PROGRESS FOR 5 DAYS    Report Status PENDING  Incomplete   Organism ID, Bacteria PSEUDOMONAS AERUGINOSA  Final   Organism ID, Bacteria METHICILLIN RESISTANT STAPHYLOCOCCUS AUREUS  Final       Susceptibility   Methicillin resistant staphylococcus aureus - MIC*    CIPROFLOXACIN >=8 RESISTANT Resistant     ERYTHROMYCIN >=8 RESISTANT Resistant     GENTAMICIN <=0.5 SENSITIVE Sensitive     OXACILLIN >=4 RESISTANT Resistant     TETRACYCLINE <=1 SENSITIVE Sensitive     VANCOMYCIN 1 SENSITIVE Sensitive     TRIMETH/SULFA <=10 SENSITIVE Sensitive     CLINDAMYCIN >=8 RESISTANT Resistant     RIFAMPIN <=0.5 SENSITIVE Sensitive     Inducible Clindamycin NEGATIVE Sensitive     * FEW METHICILLIN RESISTANT STAPHYLOCOCCUS AUREUS   Pseudomonas aeruginosa - MIC*    CEFTAZIDIME 4 SENSITIVE Sensitive     CIPROFLOXACIN <=0.25 SENSITIVE Sensitive     GENTAMICIN <=1 SENSITIVE Sensitive     IMIPENEM >=16 RESISTANT Resistant     PIP/TAZO <=4 SENSITIVE Sensitive     CEFEPIME 2 SENSITIVE Sensitive     * FEW PSEUDOMONAS AERUGINOSA         Studies: No results found.      Scheduled Meds: . amiodarone  100 mg Oral Daily  . amLODipine  5 mg Oral Daily  . apixaban  2.5 mg Oral BID  . Chlorhexidine Gluconate Cloth  6 each Topical Daily  . finasteride  5 mg Oral Daily  . gabapentin  100 mg Oral TID  . levETIRAcetam  250 mg Oral Daily  . levETIRAcetam  500 mg Oral QPM  . metoprolol succinate  25 mg Oral Daily  . pantoprazole  40 mg Oral Daily  . sodium chloride flush  5 mL Intracatheter Q8H  . tamsulosin  0.4 mg Oral Daily   Continuous Infusions: . sodium chloride Stopped (01/12/20 0554)  . lactated ringers 100 mL/hr at 01/12/20 1418  . piperacillin-tazobactam (ZOSYN)  IV 3.375 g (01/12/20 1357)  . vancomycin 2,000 mg (01/12/20 1050)    Principal Problem:   Postprocedural intraabdominal abscess Active Problems:   Hypertensive heart  disease with heart failure (HCC)   Unspecified atrial fibrillation (HCC)   Unspecified dementia without behavioral disturbance (HCC)   Chronic diastolic (congestive) heart failure (HCC)   Emphysematous cystitis   History of COVID-19    Time spent:  [place], MD, FACP, [facility]. [facility]  If 7PM-7AM, please contact night-coverage [email or web address] Password [email or web address] 01/12/2020, 4:15 PM    LOS: 4 days

## 2020-01-13 DIAGNOSIS — Z9079 Acquired absence of other genital organ(s): Secondary | ICD-10-CM

## 2020-01-13 DIAGNOSIS — I4891 Unspecified atrial fibrillation: Secondary | ICD-10-CM

## 2020-01-13 DIAGNOSIS — Z87891 Personal history of nicotine dependence: Secondary | ICD-10-CM

## 2020-01-13 DIAGNOSIS — N321 Vesicointestinal fistula: Secondary | ICD-10-CM

## 2020-01-13 DIAGNOSIS — Z87440 Personal history of urinary (tract) infections: Secondary | ICD-10-CM

## 2020-01-13 DIAGNOSIS — Z7901 Long term (current) use of anticoagulants: Secondary | ICD-10-CM

## 2020-01-13 DIAGNOSIS — E785 Hyperlipidemia, unspecified: Secondary | ICD-10-CM

## 2020-01-13 DIAGNOSIS — Z8616 Personal history of COVID-19: Secondary | ICD-10-CM

## 2020-01-13 DIAGNOSIS — K651 Peritoneal abscess: Secondary | ICD-10-CM

## 2020-01-13 DIAGNOSIS — F0390 Unspecified dementia without behavioral disturbance: Secondary | ICD-10-CM

## 2020-01-13 DIAGNOSIS — Z9849 Cataract extraction status, unspecified eye: Secondary | ICD-10-CM

## 2020-01-13 DIAGNOSIS — R319 Hematuria, unspecified: Secondary | ICD-10-CM

## 2020-01-13 DIAGNOSIS — Z960 Presence of urogenital implants: Secondary | ICD-10-CM

## 2020-01-13 DIAGNOSIS — N400 Enlarged prostate without lower urinary tract symptoms: Secondary | ICD-10-CM

## 2020-01-13 DIAGNOSIS — Z79899 Other long term (current) drug therapy: Secondary | ICD-10-CM

## 2020-01-13 DIAGNOSIS — I2510 Atherosclerotic heart disease of native coronary artery without angina pectoris: Secondary | ICD-10-CM

## 2020-01-13 DIAGNOSIS — Z951 Presence of aortocoronary bypass graft: Secondary | ICD-10-CM

## 2020-01-13 LAB — CBC
HCT: 31.3 % — ABNORMAL LOW (ref 39.0–52.0)
Hemoglobin: 10.7 g/dL — ABNORMAL LOW (ref 13.0–17.0)
MCH: 31.2 pg (ref 26.0–34.0)
MCHC: 34.2 g/dL (ref 30.0–36.0)
MCV: 91.3 fL (ref 80.0–100.0)
Platelets: 120 10*3/uL — ABNORMAL LOW (ref 150–400)
RBC: 3.43 MIL/uL — ABNORMAL LOW (ref 4.22–5.81)
RDW: 13.5 % (ref 11.5–15.5)
WBC: 24.4 10*3/uL — ABNORMAL HIGH (ref 4.0–10.5)
nRBC: 0 % (ref 0.0–0.2)

## 2020-01-13 LAB — VANCOMYCIN, TROUGH: Vancomycin Tr: 12 ug/mL — ABNORMAL LOW (ref 15–20)

## 2020-01-13 LAB — AEROBIC/ANAEROBIC CULTURE W GRAM STAIN (SURGICAL/DEEP WOUND)

## 2020-01-13 LAB — CULTURE, BLOOD (ROUTINE X 2)
Culture: NO GROWTH
Culture: NO GROWTH
Special Requests: ADEQUATE
Special Requests: ADEQUATE

## 2020-01-13 LAB — BASIC METABOLIC PANEL
Anion gap: 7 (ref 5–15)
BUN: 8 mg/dL (ref 8–23)
CO2: 24 mmol/L (ref 22–32)
Calcium: 7.5 mg/dL — ABNORMAL LOW (ref 8.9–10.3)
Chloride: 105 mmol/L (ref 98–111)
Creatinine, Ser: 0.5 mg/dL — ABNORMAL LOW (ref 0.61–1.24)
GFR calc Af Amer: >60 mL/min (ref >60)
GFR calc non Af Amer: >60 mL/min (ref >60)
Glucose, Bld: 92 mg/dL (ref 70–99)
Potassium: 3.4 mmol/L — ABNORMAL LOW (ref 3.5–5.1)
Sodium: 136 mmol/L (ref 135–145)

## 2020-01-13 MED ORDER — ENSURE ENLIVE PO LIQD
237.0000 mL | Freq: Three times a day (TID) | ORAL | Status: DC
  Administered 2020-01-13 – 2020-01-20 (×19): 237 mL via ORAL

## 2020-01-13 MED ORDER — ADULT MULTIVITAMIN W/MINERALS CH
1.0000 | ORAL_TABLET | Freq: Every day | ORAL | Status: DC
  Administered 2020-01-14 – 2020-01-20 (×7): 1 via ORAL
  Filled 2020-01-13 (×8): qty 1

## 2020-01-13 NOTE — Progress Notes (Signed)
Initial Nutrition Assessment  DOCUMENTATION CODES:   Not applicable  INTERVENTION:   Ensure Enlive po TID, each supplement provides 350 kcal and 20 grams of protein  Magic cup TID with meals, each supplement provides 290 kcal and 9 grams of protein  MVI daily   Dysphagia 3 diet   NUTRITION DIAGNOSIS:   Increased nutrient needs related to catabolic illness(COVID 19) as evidenced by increased estimated needs.  GOAL:   Patient will meet greater than or equal to 90% of their needs  MONITOR:   PO intake, Supplement acceptance, Labs, Weight trends, Skin, I & O's  REASON FOR ASSESSMENT:   Consult Assessment of nutrition requirement/status  ASSESSMENT:   86 y.o. male with medical history significant for dementia, CAD, diastolic heart failure, hypertension, peripheral vascular disease and A. 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 Unable to speak with pt r/t dementia. Pt with poor appetite and oral intake in hospital. RD will add supplements to help pt meet his estimated needs. RD will also change pt to a dysphagia 3 diet as he did well with this last admit. Per chart, pt with 9lb(5%) weight loss over the past 2 weeks; this is significant. No new weight since admit; will request weekly weights. Per MD note, pt with some muscle and fat depletions. Pt likely at high risk for developing malnutrition; unable to diagnose at this time as NFPE cannot be performed.   Medications reviewed and include: MVI, protonix, NaCl @100ml /hr, zosyn, vancomycin    Labs reviewed: K 3.4 wnl, creat 0.50(L) Wbc- 24.4(H), Hgb 10.7(L), Hct 31.3(L)  Unable to complete Nutrition-Focused physical exam at  this time as pt with COVID 19.   Diet Order:   Diet Order            Diet Heart Room service appropriate? Yes; Fluid consistency: Thin  Diet effective now             EDUCATION NEEDS:   Not appropriate for education at this time  Skin:  Skin Assessment: Reviewed RN Assessment(MASD, Stage II buttocks)  Last BM:  1/12- constipation  Height:   Ht Readings from Last 1 Encounters:  01/09/20 6\' 4"  (1.93 m)    Weight:   Wt Readings from Last 1 Encounters:  01/09/20 83.6 kg    Ideal Body Weight:  91.8 kg  BMI:  Body mass index is 22.43 kg/m.  Estimated Nutritional Needs:   Kcal:  2400-2700kcal/day  Protein:  >125g/day  Fluid:  2.4L/day  Koleen Distance MS, RD, LDN Pager #- [phone] Office#- [phone] After Hours Pager: [phone]

## 2020-01-13 NOTE — Progress Notes (Signed)
Pharmacy Antibiotic Note  Jimmy Moore is a 86 y.o. male admitted on 01/13/2020 with emphysematous cystits and postprocedural intraabdominal abscess. Patient admitted from skilled nursing facility. Patient with recent findings of MRSA and Pseudomonas in urine on 1/3 and treatment for COVID-19 pneumonia from 12/30-1/3. Pharmacy has been consulted for Vancomycin dosing. Patient also ordered Zosyn. This is day #6 of IV antibiotics  Vancomycin Levels: Peak 01/12 1339: 38.7 mcg/mL Trough 01/13 0903: 12 mcg/mL  Plan: 1) continue vancomycin 2g IV Q24hr  obtain serum creatinine daily to monitor renal function  obtain peaks and troughs as clinically inidicated Css: 38.7/11.4 mcg/mL AUC 541.1  2) continue Zosyn EI 3.375g IV Q8hr.    Height: 6\' 4"  (193 cm) Weight: 184 lb 4.9 oz (83.6 kg) IBW/kg (Calculated) : 86.8  Temp (24hrs), Avg:97.9 F (36.6 C), Min:97.5 F (36.4 C), Max:98.4 F (36.9 C)  Recent Labs  Lab 01/07/2020 0232 01/09/20 0608 01/10/20 0515 01/11/20 0611 01/12/20 0505 01/12/20 1339 01/13/20 0517  WBC 56.5* 36.1* 26.8* 22.2* 21.9*  --  24.4*  CREATININE 0.82 0.79 0.76 0.54* 0.50*  --  0.50*  LATICACIDVEN 1.3  --   --   --   --   --   --   VANCOPEAK  --   --   --   --   --  37  --     Estimated Creatinine Clearance: 75.5 mL/min (A) (by C-G formula based on SCr of 0.5 mg/dL (L)).    No Known Allergies  Antimicrobials this admission: Metronidazole 1/8 x 1 Zosyn 1/8 >>  Vancomycin 1/8 >>  Microbiology results: 1/8 BCx: no growth final 1/8 UCx: P aeruginosa 1/8 WCx P aeruginosa, MRSA   Thank you for allowing pharmacy to be a part of this patient's care.  Dallie Piles 01/13/2020 8:51 AM

## 2020-01-13 NOTE — Evaluation (Signed)
Physical Therapy Evaluation Patient Details Name: Jimmy Moore MRN: [id number] DOB: March 24, 1929 Today's Date: 01/13/2020   History of Present Illness  Pt is an 86 year old M admitted for a postprocedural intraabdominal abscess with CT guided drainage.  PMH includes Htn, PVD, atrial fibrillation, renal mass and possible baseline dementia with mild confusion mostly at night.  Clinical Impression  Pt is an 86 year old M who comes to the hospital from a SNF, and has experienced a recent decline in mobility after being independent with ambulation 2-3 weeks ago.  He is in bed and A&O x3, stating "I don't think I can" when asked if he would like to sit up at the EOB.  Pt attempted rolling and LE there ex and required mod-Max A for all activity.  He was able to demonstrate fair strength of UE's during MMT but unable to complete functional tasks without heavy assistance at this time.  He will require +2 or possibly a lift for transfers.  He was able to follow directions during supine there ex but required manual assistance/AAROM for all activity.  Pt's O2 sats remained >95% on 4L during activity and pt remained mildly tachycardic throughout.  Pt will continue to benefit from skilled PT with focus on strength, tolerance to activity and safe functional mobility.    Follow Up Recommendations SNF    Equipment Recommendations  None recommended by PT    Recommendations for Other Services       Precautions / Restrictions Precautions Precautions: Fall Restrictions Weight Bearing Restrictions: No      Mobility  Bed Mobility Overal bed mobility: Needs Assistance Bed Mobility: Rolling Rolling: Max assist         General bed mobility comments: Assistance to place hands on bedrails and to complete rolling to R and L sides.  Transfers                    Ambulation/Gait                Stairs            Wheelchair Mobility    Modified Rankin (Stroke Patients Only)        Balance                                             Pertinent Vitals/Pain Pain Assessment: No/denies pain    Home Living Family/patient expects to be discharged to:: Skilled nursing facility                      Prior Function Level of Independence: Needs assistance   Gait / Transfers Assistance Needed: Pt stated that he was independent prior to going to SNF but that he has not been able to transfer or even roll side to side in bed without assistance in the past week or so.  ADL's / Homemaking Assistance Needed: Assistance with bathing and dressing.  Meals provided to pt at SNF.        Hand Dominance        Extremity/Trunk Assessment   Upper Extremity Assessment Upper Extremity Assessment: Generalized weakness    Lower Extremity Assessment Lower Extremity Assessment: Generalized weakness(SLR: 3-/5 bilat)       Communication   Communication: No difficulties  Cognition Arousal/Alertness: Awake/alert Behavior During Therapy: WFL for tasks assessed/performed Overall Cognitive Status: Within Functional  Limits for tasks assessed                                 General Comments: A&O to self, situation and location but with delay in response at times.      General Comments      Exercises Other Exercises Other Exercises: Supine ther ex: AAROM: SLR, hip abduction, heel slides, quad sets x10 bilat.   Assessment/Plan    PT Assessment Patient needs continued PT services  PT Problem List Decreased strength;Decreased mobility;Decreased activity tolerance;Decreased balance;Decreased knowledge of use of DME       PT Treatment Interventions DME instruction;Therapeutic activities;Therapeutic exercise;Patient/family education;Balance training;Functional mobility training;Gait training    PT Goals (Current goals can be found in the Care Plan section)  Acute Rehab PT Goals PT Goal Formulation: Patient unable to participate in goal  setting    Frequency Min 2X/week   Barriers to discharge        Co-evaluation               AM-PAC PT "6 Clicks" Mobility  Outcome Measure Help needed turning from your back to your side while in a flat bed without using bedrails?: A Lot Help needed moving from lying on your back to sitting on the side of a flat bed without using bedrails?: A Lot Help needed moving to and from a bed to a chair (including a wheelchair)?: Total Help needed standing up from a chair using your arms (e.g., wheelchair or bedside chair)?: Total Help needed to walk in hospital room?: Total Help needed climbing 3-5 steps with a railing? : Total 6 Click Score: 8    End of Session Equipment Utilized During Treatment: Oxygen Activity Tolerance: Patient limited by fatigue Patient left: in bed;with call bell/phone within reach;with bed alarm set   PT Visit Diagnosis: Muscle weakness (generalized) (M62.81);Difficulty in walking, not elsewhere classified (R26.2)    Time: [id number] PT Time Calculation (min) (ACUTE ONLY): 19 min   Charges:   PT Evaluation $PT Eval Low Complexity: 1 Low PT Treatments $Therapeutic Exercise: 8-22 mins        Roxanne Gates, PT, DPT   Roxanne Gates 01/13/2020, 4:02 PM

## 2020-01-13 NOTE — Progress Notes (Signed)
Spoke with Dr. Arbutus Ped regarding patient's foley leaking; changed patient's bed x2.  Order received to replace foley catheter.

## 2020-01-13 NOTE — Consult Note (Signed)
NAME: Jimmy Moore  DOB: 01/14/1929  MRN: [id number]  Date/Time: 01/13/2020 2:59 PM  REQUESTING PROVIDER: Dr. Arbutus Ped Subjective:  REASON FOR CONSULT: Colovesical fistula ?pt is a poor historian, chart reviewed Jimmy Moore is a 86 y.o. male with a history of coronary artery disease, hypertension, hyperlipidemia, atrial fibrillation, BPH with chronic Foley, dementia presented to the ED on 01/22/2020  with abdominal pain from [facility]. He did not complain of any fever or chills or change in bowel habits.  In the ED his vitals revealed temperature of 98.3, blood pressure 142/76 heart rate of 92 and respiratory rate of 20.  His blood work was notable for a white count of 56,000.  Hemoglobin was 11.  His chemistries were mostly unremarkable.  Urinalysis showed large leukocyte esterase with negative nitrites.  CT abdomen and pelvis  Circumferential thickening of the urinary bladder with multiple foci of intraluminal gas.  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  There was a rim-enhancing low-attenuation collection within the prostate parenchyma concerning for potential abscess. The urine culture from the previous hospitalization on 01/03/2020 showed Pseudomonas and MRSA and hence he was started on vancomycin and Zosyn as per recommendation of urologist who saw him in the ED.  He underwent a successful CT-guided placement of a 10 [place] all-purpose drain catheter into the midline of the lower abdominal/pelvis with aspiration of 10 mL of blood-tinged serous fluid.  This was sent for culture and it turned out to be Pseudomonas and MRSA.  His blood cultures which were sent on admission are negative so far. He was also seen by surgery and they did not recommend any surgical intervention for  the fistula.  His WBC count is around 20 for now.  I am asked to see the patient for antibiotic management..  He has a history of BPH and was medically managed with Flomax 0.4 mg daily and Ditropan XL 10 mg daily.  He has a history of laser prostatectomy 10 years plus ago for an elevated PSA) negative biopsy in 2001.  On a.m. of 12/10/2019 he developed hematuria abruptly and had visit with the urologist that day.  Dr. Eliberto Ivory ordered a stat CTU.  This revealed an enhancing exophytic left renal lesion concerning for small neoplasm.  There was no stone or hydronephrosis.  There was asymmetric enhancement within the left prostate, could represent inflammation however the lesion is not excluded.  He then underwent cystoscopy on 12/14/2019 by Dr. Tonna Boehringer and that revealed normal urethra, the prostate fossa was approximately 5 cm in length and the lateral lobes were occlusive.  The bladder was completely inspected and found to have 3+ trabeculation.  There was no tumor identified.  The ureteral orifices were in their normal anatomic configuration bilaterally.  Patient then presented to [facility] not 12/30/2019 for bloody urine in the Foley catheter that cleared with bladder irrigation and a one-time dose of tranexamic acid.  Incidentally while in the ED he developed a fever and desaturated to 87% and tested positive for Covid.  Chest x-ray showed patchy airspace opacity and he was admitted to the hospital.  He received remdesivir and steroids.  The initial urine specimen was multiple species.  He was put on Keflex.  The urine was sent on 01/03/2020 but the patient was discharged on the same day.  He was also started on finasteride along with the Flomax which he was already taking.   Past Medical History:  Diagnosis Date  . Atherosclerotic heart disease of native coronary artery without angina pectoris   . Benign prostatic hyperplasia without lower urinary tract symptoms   . Chronic diastolic (congestive) heart  failure ([place])   . Hyperlipidemia   . Hypertensive heart disease with heart failure ([place])   . Peripheral vascular disease ([place])   . Unspecified atrial fibrillation ([place])   . Unspecified dementia without behavioral disturbance [facility])     Surgical History  CABG Appendectomy Colon surgery Left arm fracture surgery Cataract extraction   Social History   Socioeconomic History  . Marital status: Married    Spouse name: Not on file  . Number of children: Not on file  . Years of education: Not on file  . Highest education level: Not on file  Occupational History  . Not on file  Tobacco Use  . Smoking status: Former Research scientist (life sciences)  . Smokeless tobacco: Never Used  Substance and Sexual Activity  . Alcohol use: Not Currently  . Drug use: Never  . Sexual activity: Not on file  Other Topics Concern  . Not on file  Social History Narrative  . Not on file   Social Determinants of Health   Financial Resource Strain:   . Difficulty of Paying Living Expenses: Not on file  Food Insecurity:   . Worried About Charity fundraiser in the Last Year: Not on file  . Ran Out of Food in the Last Year: Not on file  Transportation Needs:   . Lack of Transportation (Medical): Not on file  . Lack of Transportation (Non-Medical): Not on file  Physical Activity:   . Days of Exercise per Week: Not on file  . Minutes of Exercise per Session: Not on file  Stress:   . Feeling of Stress : Not on file  Social Connections:   . Frequency of Communication with Friends and Family: Not on file  . Frequency of Social Gatherings with Friends and Family: Not on file  . Attends Religious Services: Not on file  . Active Member of Clubs or Organizations: Not on file  . Attends Archivist Meetings: Not on file  . Marital Status: Not on file  Intimate Partner Violence:   . Fear of Current or Ex-Partner: Not on file  . Emotionally Abused: Not on file  . Physically Abused: Not on file  . Sexually Abused:  Not on file   No Known Allergies    FH CAD -mother Bleeding disorder-father I ? Current Facility-Administered Medications  Medication Dose Route Frequency Provider Last Rate Last Admin  . 0.9 %  sodium chloride infusion   Intravenous PRN Athena Masse, MD   Stopped at 01/13/20 [phone]  . acetaminophen (TYLENOL) tablet 650 mg  650 mg Oral Q6H PRN Athena Masse, MD       Or  . acetaminophen (TYLENOL) suppository 650 mg  650 mg Rectal Q6H PRN Athena Masse, MD      . amiodarone (PACERONE) tablet 100 mg  100 mg Oral Daily Bonnell Public Tublu, MD   100 mg at 01/13/20 0843  . amLODipine (NORVASC) tablet 5 mg  5 mg Oral Daily Bonnell Public Tublu, MD   5 mg at 01/13/20 0843  . apixaban (ELIQUIS) tablet 2.5 mg  2.5 mg Oral BID Bonnell Public Tublu, MD   2.5 mg at 01/13/20 0844  . bisacodyl (  DULCOLAX) suppository 10 mg  10 mg Rectal Daily PRN Vashti Hey, MD   10 mg at 01/12/20 1812  . Chlorhexidine Gluconate Cloth 2 % PADS 6 each  6 each Topical Daily Athena Masse, MD   6 each at 01/13/20 [phone]  . feeding supplement (ENSURE ENLIVE) (ENSURE ENLIVE) liquid 237 mL  237 mL Oral TID BM Nicole Kindred A, DO   237 mL at 01/13/20 1349  . finasteride (PROSCAR) tablet 5 mg  5 mg Oral Daily Bonnell Public Tublu, MD   5 mg at 01/13/20 0845  . gabapentin (NEURONTIN) capsule 100 mg  100 mg Oral TID Bonnell Public Tublu, MD   100 mg at 01/13/20 0845  . HYDROcodone-acetaminophen (NORCO/VICODIN) 5-325 MG per tablet 1-2 tablet  1-2 tablet Oral Q4H PRN Athena Masse, MD   2 tablet at 01/13/20 0257  . lactated ringers infusion   Intravenous Continuous Vashti Hey, MD   Stopped at 01/13/20 [phone]  . levETIRAcetam (KEPPRA) tablet 250 mg  250 mg Oral Daily Bonnell Public Tublu, MD   250 mg at 01/13/20 0845  . levETIRAcetam (KEPPRA) tablet 500 mg  500 mg Oral QPM Bonnell Public Tublu, MD   500 mg at 01/12/20 2122  . metoprolol succinate (TOPROL-XL) 24  hr tablet 25 mg  25 mg Oral Daily Bonnell Public Tublu, MD   25 mg at 01/13/20 0846  . [START ON 01/14/2020] multivitamin with minerals tablet 1 tablet  1 tablet Oral Daily Nicole Kindred A, DO      . ondansetron (ZOFRAN) tablet 4 mg  4 mg Oral Q6H PRN Athena Masse, MD       Or  . ondansetron [facility]) injection 4 mg  4 mg Intravenous Q6H PRN Athena Masse, MD      . pantoprazole (PROTONIX) EC tablet 40 mg  40 mg Oral Daily Bonnell Public Tublu, MD   40 mg at 01/13/20 0846  . piperacillin-tazobactam (ZOSYN) IVPB 3.375 g  3.375 g Intravenous Q8H Bonnell Public Tublu, MD 12.5 mL/hr at 01/13/20 1350 3.375 g at 01/13/20 1350  . sodium chloride flush (NS) 0.9 % injection 5 mL  5 mL Intracatheter Q8H Sandi Mariscal, MD   5 mL at 01/13/20 1349  . tamsulosin (FLOMAX) capsule 0.4 mg  0.4 mg Oral Daily Bonnell Public Tublu, MD   0.4 mg at 01/13/20 0846  . traMADol (ULTRAM) tablet 50 mg  50 mg Oral Q6H PRN Bonnell Public Tublu, MD      . vancomycin (VANCOREADY) IVPB 2000 mg/400 mL  2,000 mg Intravenous Q24H Charlett Nose, RPH 200 mL/hr at 01/13/20 1120 Rate Verify at 01/13/20 1120     Abtx:  Anti-infectives (From admission, onward)   Start     Dose/Rate Route Frequency Ordered Stop   01/09/20 1000  vancomycin (VANCOREADY) IVPB 2000 mg/400 mL     2,000 mg 200 mL/hr over 120 Minutes Intravenous Every 24 hours 01/20/2020 0954     01/09/20 0800  vancomycin (VANCOCIN) IVPB 1000 mg/200 mL premix  Status:  Discontinued     1,000 mg 200 mL/hr over 60 Minutes Intravenous Every 24 hours 01/28/2020 0832 01/29/2020 2022   01/17/2020 2030  vancomycin (VANCOCIN) IVPB 1000 mg/200 mL premix     1,000 mg 200 mL/hr over 60 Minutes Intravenous  Once 01/25/2020 2022 01/06/2020 2142   01/02/2020 1400  metroNIDAZOLE (FLAGYL) IVPB 500 mg  Status:  Discontinued     500 mg 100 mL/hr over 60 Minutes Intravenous Every  8 hours 01/19/2020 0832 01/03/2020 0835   01/07/2020 1400  piperacillin-tazobactam (ZOSYN) IVPB  3.375 g     3.375 g 12.5 mL/hr over 240 Minutes Intravenous Every 8 hours 01/27/2020 0832     01/05/2020 1000  vancomycin (VANCOCIN) IVPB 1000 mg/200 mL premix  Status:  Discontinued     1,000 mg 200 mL/hr over 60 Minutes Intravenous  Once 01/20/2020 0954 01/05/2020 2022   01/19/2020 0615  piperacillin-tazobactam (ZOSYN) IVPB 3.375 g  Status:  Discontinued     3.375 g 100 mL/hr over 30 Minutes Intravenous Every 8 hours 01/15/2020 0610 01/09/2020 0832   01/05/2020 0615  metroNIDAZOLE (FLAGYL) IVPB 500 mg  Status:  Discontinued     500 mg 100 mL/hr over 60 Minutes Intravenous Every 8 hours 01/26/2020 0610 01/07/2020 0832   01/31/2020 0615  vancomycin (VANCOCIN) IVPB 1000 mg/200 mL premix  Status:  Discontinued     1,000 mg 200 mL/hr over 60 Minutes Intravenous Every 24 hours 01/03/2020 0610 01/20/2020 0832   01/25/2020 0500  piperacillin-tazobactam (ZOSYN) IVPB 3.375 g     3.375 g 100 mL/hr over 30 Minutes Intravenous  Once 01/20/2020 0449 01/09/2020 0558   01/31/2020 0500  vancomycin (VANCOCIN) IVPB 1000 mg/200 mL premix     1,000 mg 200 mL/hr over 60 Minutes Intravenous  Once 01/06/2020 0449 01/16/2020 1318   01/06/2020 0500  metroNIDAZOLE (FLAGYL) IVPB 500 mg     500 mg 100 mL/hr over 60 Minutes Intravenous  Once 01/12/2020 0449 01/02/2020 0909      REVIEW OF SYSTEMS:  Pt unable to hear well, and also some confusion- The noise from the neg pressure room is too difficult to talk- tried to talk to him on the phone but couldnt Objective:  VITALS:  BP 130/84 (BP Location: Left Arm)   Pulse (!) 101   Temp 99.6 F (37.6 C) (Axillary)   Resp (!) 32   Ht 6\' 4"  (1.93 m)   Wt 90.1 kg   SpO2 92%   BMI 24.18 kg/m  PHYSICAL EXAM:  General: awake, no distress.  Head: Normocephalic, without obvious abnormality, atraumatic. Eyes: Conjunctivae clear, anicteric sclerae. Pupils are equal ENT did not examine Neck: Supple,  Back: No CVA tenderness. Lungs: b/l air entry Heart: irregular Abdomen: Soft, tenderness suprapubic  area Abdominal drain- has urine like fluid blood stained   He has a foley and there is blood on the skin The foley bag is empty now Extremities: edema ankles  Skin: No rashes or lesions. Or bruising Lymph: Cervical, supraclavicular normal. Neurologic: Grossly non-focal Pertinent Labs Lab Results CBC    Component Value Date/Time   WBC 24.4 (H) 01/13/2020 0517   RBC 3.43 (L) 01/13/2020 0517   HGB 10.7 (L) 01/13/2020 0517   HCT 31.3 (L) 01/13/2020 0517   PLT 120 (L) 01/13/2020 0517   MCV 91.3 01/13/2020 0517   MCH 31.2 01/13/2020 0517   MCHC 34.2 01/13/2020 0517   RDW 13.5 01/13/2020 0517   LYMPHSABS 0.8 01/09/2020 0608   MONOABS 6.1 (H) 01/09/2020 0608   EOSABS 0.0 01/09/2020 0608   BASOSABS 0.1 01/09/2020 0608    CMP Latest Ref Rng & Units 01/13/2020 01/12/2020 01/11/2020  Glucose 70 - 99 mg/dL 92 80 78  BUN 8 - 23 mg/dL 8 9 10   Creatinine 0.61 - 1.24 mg/dL 0.50(L) 0.50(L) 0.54(L)  Sodium 135 - 145 mmol/L 136 134(L) 134(L)  Potassium 3.5 - 5.1 mmol/L 3.4(L) 3.2(L) 3.5  Chloride 98 - 111 mmol/L 105 104 105  CO2  22 - 32 mmol/L 24 23 20(L)  Calcium 8.9 - 10.3 mg/dL 7.5(L) 7.5(L) 7.3(L)  Total Protein 6.5 - 8.1 g/dL - 5.1(L) 5.2(L)  Total Bilirubin 0.3 - 1.2 mg/dL - 1.2 1.1  Alkaline Phos 38 - 126 U/L - 49 48  AST 15 - 41 U/L - 16 15  ALT 0 - 44 U/L - 17 15      Microbiology: Recent Results (from the past 240 hour(s))  Urine culture     Status: Abnormal   Collection Time: 01/02/2020  2:32 AM   Specimen: Urine, Random  Result Value Ref Range Status   Specimen Description   Final    URINE, RANDOM Performed at [facility], [address] [place], [place] [address]    Special Requests   Final    NONE Performed at [facility], [address] [place], [place] [address]    Culture >=100,000 COLONIES/mL PSEUDOMONAS AERUGINOSA (A)  Final   Report Status 01/10/2020 FINAL  Final   Organism ID, Bacteria PSEUDOMONAS AERUGINOSA (A)  Final       Susceptibility   Pseudomonas aeruginosa - MIC*    CEFTAZIDIME 2 SENSITIVE Sensitive     CIPROFLOXACIN <=0.25 SENSITIVE Sensitive     GENTAMICIN <=1 SENSITIVE Sensitive     IMIPENEM >=16 RESISTANT Resistant     PIP/TAZO <=4 SENSITIVE Sensitive     CEFEPIME 2 SENSITIVE Sensitive     * >=100,000 COLONIES/mL PSEUDOMONAS AERUGINOSA  Blood culture (routine x 2)     Status: None   Collection Time: 01/26/2020  5:11 AM   Specimen: BLOOD  Result Value Ref Range Status   Specimen Description BLOOD LEFT ASSIST CONTROL  Final   Special Requests   Final    BOTTLES DRAWN AEROBIC AND ANAEROBIC Blood Culture adequate volume   Culture   Final    NO GROWTH 5 DAYS Performed at [facility], [address] [place], [place] [address]    Report Status 01/13/2020 FINAL  Final  Blood culture (routine x 2)     Status: None   Collection Time: 01/11/2020  5:12 AM   Specimen: BLOOD  Result Value Ref Range Status   Specimen Description BLOOD RIGHT ASSIST CONTROL  Final   Special Requests   Final    BOTTLES DRAWN AEROBIC AND ANAEROBIC Blood Culture adequate volume   Culture   Final    NO GROWTH 5 DAYS Performed at [facility], [address] [place], [place] [address]    Report Status 01/13/2020 FINAL  Final  Aerobic/Anaerobic Culture (surgical/deep wound)     Status: None   Collection Time: 01/31/2020  5:30 PM   Specimen: Abscess  Result Value Ref Range Status   Specimen Description ABSCESS DRAINAGE  Final   Special Requests NONE  Final   Gram Stain   Final    ABUNDANT WBC PRESENT, PREDOMINANTLY PMN NO ORGANISMS SEEN    Culture   Final    FEW PSEUDOMONAS AERUGINOSA FEW METHICILLIN RESISTANT STAPHYLOCOCCUS AUREUS NO ANAEROBES ISOLATED Performed at [facility], [place] [address] [place],  [address]    Report Status 01/13/2020 FINAL  Final   Organism ID, Bacteria PSEUDOMONAS AERUGINOSA  Final   Organism ID, Bacteria METHICILLIN RESISTANT STAPHYLOCOCCUS AUREUS  Final       Susceptibility   Methicillin resistant staphylococcus aureus - MIC*    CIPROFLOXACIN >=8 RESISTANT Resistant     ERYTHROMYCIN >=8 RESISTANT Resistant     GENTAMICIN <=0.5 SENSITIVE Sensitive  OXACILLIN >=4 RESISTANT Resistant     TETRACYCLINE <=1 SENSITIVE Sensitive     VANCOMYCIN 1 SENSITIVE Sensitive     TRIMETH/SULFA <=10 SENSITIVE Sensitive     CLINDAMYCIN >=8 RESISTANT Resistant     RIFAMPIN <=0.5 SENSITIVE Sensitive     Inducible Clindamycin NEGATIVE Sensitive     * FEW METHICILLIN RESISTANT STAPHYLOCOCCUS AUREUS   Pseudomonas aeruginosa - MIC*    CEFTAZIDIME 4 SENSITIVE Sensitive     CIPROFLOXACIN <=0.25 SENSITIVE Sensitive     GENTAMICIN <=1 SENSITIVE Sensitive     IMIPENEM >=16 RESISTANT Resistant     PIP/TAZO <=4 SENSITIVE Sensitive     CEFEPIME 2 SENSITIVE Sensitive     * FEW PSEUDOMONAS AERUGINOSA    IMAGING RESULTS:  I have personally reviewed the films ? Impression/Recommendation ? ?Vesicle fistula with intra-abdominal abscess- Doubt this is colovesical fistula- enteric -vesical fistula questioned- there is a breach in the dome of the bladder on CT - was this a traumatic fistula or was it due  to underlying bladder pathology? MRSA and Pseudomonas in the abscess culture-currently on vanco and zosyn  Recommend  repeating imaging- may be [place] to look for resolution of fluid -- would see whether we can get delafloxacin PO or it would have to be cipro+ linezolid ( Bactrim)( creatinine N)  BPH on tamsulosin  Hematuria- concern for left renal carcinoma   Hematuria in mid December leading to CTU and cystoscopy on 12/14/2019.  The CT abdomen done on 12/10/2019 did not reveal any abscess but did show left renal exophytic mass of 1.3 cm concerning for neoplasm.  COVID 11- diagnosed Dec 2202- S/p remdisivir and steroids- asymptomatic now  Afib- rate controlled on eliquis  ?will discuss with his family after repeat  imaging ___________________________________________________ Note:  This document was prepared using Dragon voice recognition software and may include unintentional dictation errors.

## 2020-01-13 NOTE — TOC Initial Note (Signed)
Transition of Care [facility]) - Initial/Assessment Note    Patient Details  Name: Jimmy Moore MRN: [id number] Date of Birth: Jul 09, 1929  Transition of Care [facility]) CM/SW Contact:    Beverly Sessions, RN Phone Number: 01/13/2020, 2:18 PM  Clinical Narrative:                 RNCM confirmed with Seth Bake at  [facility] that patient is from SNF.  Prior to that patient was from home.   Patient can return at discharge  PT eval pending         Patient Goals and CMS Choice        Expected Discharge Plan and Services                                                Prior Living Arrangements/Services                       Activities of Daily Living Home Assistive Devices/Equipment: Other (Comment) ADL Screening (condition at time of admission) Patient's cognitive ability adequate to safely complete daily activities?: No Is the patient deaf or have difficulty hearing?: Yes Does the patient have difficulty seeing, even when wearing glasses/contacts?: No Does the patient have difficulty concentrating, remembering, or making decisions?: Yes Patient able to express need for assistance with ADLs?: Yes Does the patient have difficulty dressing or bathing?: Yes Independently performs ADLs?: No Communication: Independent Dressing (OT): Needs assistance Is this a change from baseline?: Pre-admission baseline Grooming: Needs assistance Is this a change from baseline?: Pre-admission baseline Feeding: Needs assistance Is this a change from baseline?: Pre-admission baseline Bathing: Needs assistance Is this a change from baseline?: Pre-admission baseline Toileting: Needs assistance Is this a change from baseline?: Pre-admission baseline In/Out Bed: Needs assistance Is this a change from baseline?: Pre-admission baseline Walks in Home: Dependent Is this a change from baseline?: Pre-admission baseline Does the patient have difficulty walking or climbing stairs?:  Yes Weakness of Legs: Both Weakness of Arms/Hands: Both  Permission Sought/Granted                  Emotional Assessment              Admission diagnosis:  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 History of COVID-19 01/02/2020  . Postprocedural intraabdominal abscess 01/07/2020  . Pressure injury of skin 01/03/2020  . Gross hematuria 12/30/2019  . Acute hypoxemic respiratory failure due to COVID-19 ([place]) 12/30/2019  . Hypertensive heart disease with heart failure ([place])   . Unspecified atrial fibrillation ([place])   . Unspecified dementia without behavioral disturbance ([place])   . Peripheral vascular disease ([place])   . Hyperlipidemia   . Chronic diastolic (congestive) heart failure ([place])    PCP:  Venia Carbon, MD Pharmacy:   [facility] [place] [address] [place] [address] Phone: [phone] Fax: [phone]     Social Determinants of Health (SDOH) Interventions    Readmission Risk Interventions No flowsheet data found.

## 2020-01-13 NOTE — Progress Notes (Addendum)
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  Patient is now growing out Pseudomonas from his urine as well as from the abscess area.  It is sensitive to Zosyn.  Subjective 1/13: Patient seen this morning awake laying in bed.  Reports feeling okay.  No fevers/chills, N/V/D, chest pain, SOB or other complaints.  Has had BM today.  No acute events reported.  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   --IR does believe there is some communication between the bladder and they think it needs to be there longer so that can heal up. --No further imaging needed unless his abdominal exam worsens. --contniue Vancomycin and Pseudomonas for now --will get ID consult for recommendations of antibiotics & duration. --urology and surgery following  Emphysematous cystitis with possible prostate abscess --seems to be responding to vancomycin and Zosyn day # 6 --Zosyn sensitive Pseudomonas is growing out of urine. --Urology is following.   --Foley is to remain in place for 3 to 4 weeks.  Marked leukocytosis - improving with  antibiotics Improved after percutaneous drainage 56--36--26--22--22--24 today  History of COVID-19pneumonia --Diagnosed with Covid on 12/30/2019 --s/p  remdesivir and steroids discharged 01/01/2020 --persistent groundglass opacities in bilateral lower lobes and right middle lobe on CT most likely post-Covid inflammation. --patient without respiratory symptoms  Atrial fibrillation, chronic - rate controlled --continue Eliquis --continueamiodarone, metoprolol for rate control  Coronary disease, secondary prevention Hypertension Chronic diastolic CHF --Euvolemic, blood pressure normal --Continueamlodipine, metoprolol --Hold losartan for now, add back if blood pressure warrants. --Patient does not appear to be on antiplatelet agent at home.  BPH Patient had Foley placed by urology, will need to be in for 3 weeks with outpatient follow-up with urology.   --Continuetamsulosin and finasteride  GERD Continuepantoprazole  Seizure disorder Continues Keppra  Dementia without behavioral disturbance Monitor for behavioral disturbance  High risk of malnutrition He has moderate muscle mass and fat loss.  Longer-term plans Per Dr. Jamse Arn: "Conversation 01/11/20 with daughter Jimmy Moore and her husband Jimmy Moore who is a Software engineer about long-term plans for care for Jimmy Moore.  I noted it could be very difficult to eradicate abscess without surgery and long-term outcome looks challenging. She states she will discuss the situation with her brother and sisters but at this point to continue with imaging and treatment as we are doing.  She right bring up the possibility of palliative care with them. On rediscussion 01/12/2020 with son-in-law Jimmy Moore I updated him about marked improvement in abdominal exam as well as recommendations of radiology to continue present course and no further recommendations for imaging.  He is concerned about patient's nutritional status and not wanting to  eat which I concur with.  Will place dietary consult for possible supplements.  Patient is DNR"   DVT prophylaxis: on Eliquis   Code Status: DNR  Family Communication: none at bedside  Disposition Plan:  Pending clinical improvement and PT evaluation.  Urology and general surgery following.    Patient requires continued hospital level care for IV antibiotics for intraabdominal abscess with possible fistula.   Consultants:   Urology  General surgery  Procedures: Placement of abdominal abscess drain percutaneously by IR  Antimicrobials:   Vancomycin  Zosyn   Objective: Vitals:   01/12/20 1252 01/12/20 1255 01/12/20 2038 01/13/20 0446  BP: (!) 144/81  (!) 156/71 (!) 157/71  Pulse: 87 96 100 89  Resp:   (!) 22 20  Temp: 97.8 F (36.6 C)  98.4 F (36.9 C) (!) 97.5 F (36.4 C)  TempSrc:   Oral Oral  SpO2: (!) 86% 90% 90% 90%  Weight:      Height:        Intake/Output Summary (Last 24 hours) at 01/13/2020 0819 Last data filed at 01/13/2020 [id number] Gross per 24 hour  Intake 3084.41 ml  Output 1290 ml  Net 1794.41 ml   Filed Weights   01/13/2020 0150 01/09/20 0009  Weight: 87.9 kg 83.6 kg    Examination:  General exam: awake, alert, no acute distress, obese HEENT: moist mucus membranes, hearing grossly normal  Respiratory system: decreased breath sounds but clear, no wheezes, rales or rhonchi, normal respiratory effort. Cardiovascular system: normal S1/S2, RRR, no JVD, murmurs, rubs, gallops, no pedal edema.   Gastrointestinal system: soft, non-tender, non-distended abdomen, normal bowel sounds. Central nervous system: alert and oriented x3. no gross focal neurologic deficits, normal speech Extremities: moves all, no cyanosis, normal tone Skin: dry, intact, normal temperature Psychiatry: normal mood, congruent affect, judgement and insight appear normal    Data Reviewed: I have personally reviewed following labs and imaging studies  CBC: Recent Labs  Lab  01/07/2020 0232 01/09/20 0608 01/10/20 0515 01/11/20 0611 01/12/20 0505 01/13/20 0517  WBC 56.5* 36.1* 26.8* 22.2* 21.9* 24.4*  NEUTROABS 39.4* 26.6*  --   --   --   --   HGB 13.7 11.6* 11.4* 11.2* 11.3* 10.7*  HCT 40.7 35.8* 34.2* 33.8* 33.6* 31.3*  MCV 91.1 93.7 92.4 91.4 91.1 91.3  PLT 214 151 134* 142* 129* [id number]*   Basic Metabolic Panel: Recent Labs  Lab 01/09/20 0608 01/10/20 0515 01/11/20 0611 01/12/20 0505 01/13/20 0517  NA 136 134* 134* 134* 136  K 3.6 3.6 3.5 3.2* 3.4*  CL 106 104 105 104 105  CO2 20* 18* 20* 23 24  GLUCOSE 77 63* 78 80 92  BUN 19 15 10 9 8   CREATININE 0.79 0.76 0.54* 0.50* 0.50*  CALCIUM 7.7* 7.4* 7.3* 7.5* 7.5*   GFR: Estimated Creatinine Clearance: 75.5 mL/min (A) (by C-G formula based on SCr of 0.5 mg/dL (L)). Liver Function Tests: Recent Labs  Lab 01/26/2020 0232 01/09/20 0608 01/11/20 0611 01/12/20 0505  AST 18 14* 15 16  ALT 20 14 15 17   ALKPHOS 62 62 48 49  BILITOT 1.1 1.0 1.1 1.2  PROT 6.3* 5.3* 5.2* 5.1*  ALBUMIN 2.6* 2.1* 2.0* 1.9*   No results for input(s): LIPASE, AMYLASE in the last 168 hours. No results for input(s): AMMONIA in the last 168 hours. Coagulation Profile: No results for input(s): INR, PROTIME in the last 168 hours. Cardiac Enzymes: No results for input(s): CKTOTAL, CKMB, CKMBINDEX, TROPONINI in the last 168 hours. BNP (last 3 results) No results for input(s): PROBNP in the last 8760  hours. HbA1C: No results for input(s): HGBA1C in the last 72 hours. CBG: No results for input(s): GLUCAP in the last 168 hours. Lipid Profile: No results for input(s): CHOL, HDL, LDLCALC, TRIG, CHOLHDL, LDLDIRECT in the last 72 hours. Thyroid Function Tests: No results for input(s): TSH, T4TOTAL, FREET4, T3FREE, THYROIDAB in the last 72 hours. Anemia Panel: No results for input(s): VITAMINB12, FOLATE, FERRITIN, TIBC, IRON, RETICCTPCT in the last 72 hours. Sepsis Labs: Recent Labs  Lab 01/10/2020 0232  LATICACIDVEN 1.3     Recent Results (from the past 240 hour(s))  Urine Culture     Status: Abnormal   Collection Time: 01/03/20 10:36 AM   Specimen: Urine, Random  Result Value Ref Range Status   Specimen Description   Final    URINE, RANDOM Performed at [facility], [address], [place], [place] [address]    Special Requests   Final    NONE Performed at [facility], [address] [place], [place] [address]    Culture (A)  Final    70,000 COLONIES/mL METHICILLIN RESISTANT STAPHYLOCOCCUS AUREUS >=100,000 COLONIES/mL PSEUDOMONAS AERUGINOSA    Report Status 01/05/2020 FINAL  Final   Organism ID, Bacteria METHICILLIN RESISTANT STAPHYLOCOCCUS AUREUS (A)  Final   Organism ID, Bacteria PSEUDOMONAS AERUGINOSA (A)  Final      Susceptibility   Methicillin resistant staphylococcus aureus - MIC*    CIPROFLOXACIN >=8 RESISTANT Resistant     GENTAMICIN <=0.5 SENSITIVE Sensitive     NITROFURANTOIN <=16 SENSITIVE Sensitive     OXACILLIN >=4 RESISTANT Resistant     TETRACYCLINE <=1 SENSITIVE Sensitive     VANCOMYCIN 1 SENSITIVE Sensitive     TRIMETH/SULFA <=10 SENSITIVE Sensitive     CLINDAMYCIN >=8 RESISTANT Resistant     RIFAMPIN <=0.5 SENSITIVE Sensitive     Inducible Clindamycin NEGATIVE Sensitive     * 70,000 COLONIES/mL METHICILLIN RESISTANT STAPHYLOCOCCUS AUREUS   Pseudomonas aeruginosa - MIC*    CEFTAZIDIME 2 SENSITIVE Sensitive     CIPROFLOXACIN 0.5 SENSITIVE Sensitive     GENTAMICIN <=1 SENSITIVE Sensitive     IMIPENEM >=16 RESISTANT Resistant     PIP/TAZO <=4 SENSITIVE Sensitive     CEFEPIME 2 SENSITIVE Sensitive     * >=100,000 COLONIES/mL PSEUDOMONAS AERUGINOSA  Urine culture     Status: Abnormal   Collection Time: 01/07/2020  2:32 AM   Specimen: Urine, Random  Result Value Ref Range Status   Specimen Description   Final    URINE, RANDOM Performed at [facility], [address], [place], [place] [address]    Special Requests   Final     NONE Performed at [facility], [address] [place], [place] [address]    Culture >=100,000 COLONIES/mL PSEUDOMONAS AERUGINOSA (A)  Final   Report Status 01/10/2020 FINAL  Final   Organism ID, Bacteria PSEUDOMONAS AERUGINOSA (A)  Final      Susceptibility   Pseudomonas aeruginosa - MIC*    CEFTAZIDIME 2 SENSITIVE Sensitive     CIPROFLOXACIN <=0.25 SENSITIVE Sensitive     GENTAMICIN <=1 SENSITIVE Sensitive     IMIPENEM >=16 RESISTANT Resistant     PIP/TAZO <=4 SENSITIVE Sensitive     CEFEPIME 2 SENSITIVE Sensitive     * >=100,000 COLONIES/mL PSEUDOMONAS AERUGINOSA  Blood culture (routine x 2)     Status: None   Collection Time: 01/22/2020  5:11 AM   Specimen: BLOOD  Result Value Ref Range Status   Specimen Description BLOOD LEFT ASSIST CONTROL  Final   Special Requests   Final    BOTTLES DRAWN AEROBIC AND ANAEROBIC Blood Culture adequate volume   Culture   Final    NO GROWTH 5 DAYS Performed at [facility], [address] [place], [place] [address]    Report Status 01/13/2020 FINAL  Final  Blood culture (routine x 2)     Status: None   Collection Time: 01/18/2020  5:12 AM   Specimen: BLOOD  Result Value Ref Range Status   Specimen Description BLOOD RIGHT ASSIST CONTROL  Final   Special Requests   Final    BOTTLES DRAWN AEROBIC AND ANAEROBIC Blood Culture adequate volume   Culture   Final    NO GROWTH 5 DAYS Performed at [facility], [address], [place], [place] [address]    Report Status 01/13/2020 FINAL  Final  Aerobic/Anaerobic Culture (surgical/deep wound)     Status: None (Preliminary result)   Collection Time: 01/23/2020  5:30 PM   Specimen: Abscess  Result Value Ref Range Status   Specimen Description ABSCESS DRAINAGE  Final   Special Requests NONE  Final   Gram Stain   Final    ABUNDANT WBC PRESENT, PREDOMINANTLY PMN NO ORGANISMS SEEN Performed at [facility], [place] [address] [place], [place] [address]    Culture    Final    FEW PSEUDOMONAS AERUGINOSA FEW METHICILLIN RESISTANT STAPHYLOCOCCUS AUREUS NO ANAEROBES ISOLATED; CULTURE IN PROGRESS FOR 5 DAYS    Report Status PENDING  Incomplete   Organism ID, Bacteria PSEUDOMONAS AERUGINOSA  Final   Organism ID, Bacteria METHICILLIN RESISTANT STAPHYLOCOCCUS AUREUS  Final      Susceptibility   Methicillin resistant staphylococcus aureus - MIC*    CIPROFLOXACIN >=8 RESISTANT Resistant     ERYTHROMYCIN >=8 RESISTANT Resistant     GENTAMICIN <=0.5 SENSITIVE Sensitive     OXACILLIN >=4 RESISTANT Resistant     TETRACYCLINE <=1 SENSITIVE Sensitive     VANCOMYCIN 1 SENSITIVE Sensitive     TRIMETH/SULFA <=10 SENSITIVE Sensitive     CLINDAMYCIN >=8 RESISTANT Resistant     RIFAMPIN <=0.5 SENSITIVE Sensitive     Inducible Clindamycin NEGATIVE Sensitive     * FEW METHICILLIN RESISTANT STAPHYLOCOCCUS AUREUS   Pseudomonas aeruginosa - MIC*    CEFTAZIDIME 4 SENSITIVE Sensitive     CIPROFLOXACIN <=0.25 SENSITIVE Sensitive     GENTAMICIN <=1 SENSITIVE Sensitive     IMIPENEM >=16 RESISTANT Resistant     PIP/TAZO <=4 SENSITIVE Sensitive     CEFEPIME 2 SENSITIVE Sensitive     * FEW PSEUDOMONAS AERUGINOSA         Radiology Studies: No results found.      Scheduled Meds: . amiodarone  100 mg Oral Daily  . amLODipine  5 mg Oral Daily  . apixaban  2.5 mg Oral BID  . Chlorhexidine Gluconate Cloth  6 each Topical Daily  . finasteride  5 mg Oral Daily  . gabapentin  100 mg Oral TID  . levETIRAcetam  250 mg Oral Daily  . levETIRAcetam  500 mg Oral QPM  . metoprolol succinate  25 mg Oral Daily  . pantoprazole  40 mg Oral Daily  . sodium chloride flush  5 mL Intracatheter Q8H  . tamsulosin  0.4 mg Oral Daily   Continuous Infusions: . sodium chloride Stopped (01/13/20 0459)  . lactated ringers 100 mL/hr at 01/13/20 [id number]  . piperacillin-tazobactam (ZOSYN)  IV 12.5 mL/hr at 01/13/20 [id number]  . vancomycin Stopped (01/12/20 1723)  LOS: 5 days    Time  spent: 30-35 minutes    Ezekiel Slocumb, DO [facility]   If 7PM-7AM, please contact night-coverage [email or web address] Password [facility] 01/13/2020, 8:19 AM

## 2020-01-14 ENCOUNTER — Inpatient Hospital Stay: Payer: Medicare Other

## 2020-01-14 ENCOUNTER — Encounter: Payer: Self-pay | Admitting: Internal Medicine

## 2020-01-14 DIAGNOSIS — N322 Vesical fistula, not elsewhere classified: Secondary | ICD-10-CM

## 2020-01-14 DIAGNOSIS — N3090 Cystitis, unspecified without hematuria: Secondary | ICD-10-CM

## 2020-01-14 DIAGNOSIS — N401 Enlarged prostate with lower urinary tract symptoms: Secondary | ICD-10-CM

## 2020-01-14 DIAGNOSIS — Z978 Presence of other specified devices: Secondary | ICD-10-CM

## 2020-01-14 DIAGNOSIS — B9562 Methicillin resistant Staphylococcus aureus infection as the cause of diseases classified elsewhere: Secondary | ICD-10-CM

## 2020-01-14 DIAGNOSIS — B965 Pseudomonas (aeruginosa) (mallei) (pseudomallei) as the cause of diseases classified elsewhere: Secondary | ICD-10-CM

## 2020-01-14 DIAGNOSIS — D72829 Elevated white blood cell count, unspecified: Secondary | ICD-10-CM

## 2020-01-14 DIAGNOSIS — R338 Other retention of urine: Secondary | ICD-10-CM

## 2020-01-14 LAB — CBC WITH DIFFERENTIAL/PLATELET
Abs Immature Granulocytes: 1.63 10*3/uL — ABNORMAL HIGH (ref 0.00–0.07)
Basophils Absolute: 0.1 10*3/uL (ref 0.0–0.1)
Basophils Relative: 0 %
Eosinophils Absolute: 0 10*3/uL (ref 0.0–0.5)
Eosinophils Relative: 0 %
HCT: 32.4 % — ABNORMAL LOW (ref 39.0–52.0)
Hemoglobin: 11.1 g/dL — ABNORMAL LOW (ref 13.0–17.0)
Immature Granulocytes: 5 %
Lymphocytes Relative: 2 %
Lymphs Abs: 0.7 10*3/uL (ref 0.7–4.0)
MCH: 31.1 pg (ref 26.0–34.0)
MCHC: 34.3 g/dL (ref 30.0–36.0)
MCV: 90.8 fL (ref 80.0–100.0)
Monocytes Absolute: 5.9 10*3/uL — ABNORMAL HIGH (ref 0.1–1.0)
Monocytes Relative: 18 %
Neutro Abs: 24.4 10*3/uL — ABNORMAL HIGH (ref 1.7–7.7)
Neutrophils Relative %: 75 %
Platelets: 118 10*3/uL — ABNORMAL LOW (ref 150–400)
RBC: 3.57 MIL/uL — ABNORMAL LOW (ref 4.22–5.81)
RDW: 13.6 % (ref 11.5–15.5)
Smear Review: NORMAL
WBC: 32.7 10*3/uL — ABNORMAL HIGH (ref 4.0–10.5)
nRBC: 0 % (ref 0.0–0.2)

## 2020-01-14 LAB — BASIC METABOLIC PANEL
Anion gap: 8 (ref 5–15)
BUN: 11 mg/dL (ref 8–23)
CO2: 25 mmol/L (ref 22–32)
Calcium: 7.8 mg/dL — ABNORMAL LOW (ref 8.9–10.3)
Chloride: 105 mmol/L (ref 98–111)
Creatinine, Ser: 0.55 mg/dL — ABNORMAL LOW (ref 0.61–1.24)
GFR calc Af Amer: >60 mL/min (ref >60)
GFR calc non Af Amer: >60 mL/min (ref >60)
Glucose, Bld: 129 mg/dL — ABNORMAL HIGH (ref 70–99)
Potassium: 3.3 mmol/L — ABNORMAL LOW (ref 3.5–5.1)
Sodium: 138 mmol/L (ref 135–145)

## 2020-01-14 LAB — MAGNESIUM: Magnesium: 1.6 mg/dL — ABNORMAL LOW (ref 1.7–2.4)

## 2020-01-14 LAB — PROCALCITONIN: Procalcitonin: 0.31 ng/mL

## 2020-01-14 MED ORDER — CIPROFLOXACIN HCL 250 MG PO TABS
500.0000 mg | ORAL_TABLET | Freq: Two times a day (BID) | ORAL | Status: DC
  Administered 2020-01-14 – 2020-01-15 (×3): 500 mg via ORAL
  Filled 2020-01-14 (×2): qty 1
  Filled 2020-01-14: qty 2
  Filled 2020-01-14: qty 1

## 2020-01-14 MED ORDER — POTASSIUM CHLORIDE CRYS ER 20 MEQ PO TBCR
40.0000 meq | EXTENDED_RELEASE_TABLET | ORAL | Status: AC
  Administered 2020-01-14 (×2): 40 meq via ORAL
  Filled 2020-01-14 (×2): qty 2

## 2020-01-14 MED ORDER — IOHEXOL 350 MG/ML SOLN
100.0000 mL | Freq: Once | INTRAVENOUS | Status: AC | PRN
  Administered 2020-01-14: 14:00:00 100 mL via INTRAVENOUS

## 2020-01-14 MED ORDER — IOHEXOL 9 MG/ML PO SOLN: 500.0000 mL | ORAL | Status: DC

## 2020-01-14 NOTE — Progress Notes (Signed)
Physical Therapy Treatment Patient Details Name: Jimmy Moore MRN: [id number] DOB: 06-14-1929 Today's Date: 01/14/2020    History of Present Illness Pt is an 86 year old M admitted for a postprocedural intraabdominal abscess with CT guided drainage.  PMH includes Htn, PVD, atrial fibrillation, renal mass and possible baseline dementia with mild confusion mostly at night.    PT Comments    Pt lethargic today and less responsive, able to participate with some supine there ex with verbal and manual cues for form and full ROM.  He was hypoxic at beginning of session and only improved with increase in O2 to 4.5L.  No improvement with breathing exercises only.  Pt did participate with conversation and was appropriate 50% of time.  He was able to use LE's to scoot up in bed with bed in trendelenburg, following directions appropriately.  Nursing in to administer medication during treatment.  Pt will continue to benefit from skilled PT with focus on strength, tolerance to activity and independence with mobility.  Follow Up Recommendations  SNF     Equipment Recommendations  None recommended by PT    Recommendations for Other Services       Precautions / Restrictions Precautions Precautions: Fall Restrictions Weight Bearing Restrictions: No    Mobility  Bed Mobility Overal bed mobility: (Did not perform due to level of consciousness.)             General bed mobility comments: Scooting  up in bed with bed in trendeleburg: able to use bedrails and bridge minimally to assist.  Transfers                    Ambulation/Gait                 Stairs             Wheelchair Mobility    Modified Rankin (Stroke Patients Only)       Balance                                            Cognition Arousal/Alertness: Lethargic Behavior During Therapy: Flat affect Overall Cognitive Status: No family/caregiver present to determine baseline  cognitive functioning                                 General Comments: Pt closing eyes throughout treatment, intermittently responsive, appropriate to conversation 50% of time.      Exercises Other Exercises Other Exercises: Supine ther. ex.: SLR, hip abduction, pillow squeeze, heel slides, quad sets x12 bilat. with VC's and manual cues for form and setup.  Pt stopping intermittently throughout each set and closing his eyes today.  Pt hypoxic at beginning of session and improving only to low 90% range with 4.5 L.  Nursing notified. Other Exercises: Nursing in to administer medication x7 min.  PT assisted with positioning.    General Comments        Pertinent Vitals/Pain Pain Assessment: No/denies pain    Home Living                      Prior Function            PT Goals (current goals can now be found in the care plan section) Acute Rehab PT Goals PT Goal Formulation:  Patient unable to participate in goal setting    Frequency    Min 2X/week      PT Plan Current plan remains appropriate    Co-evaluation              AM-PAC PT "6 Clicks" Mobility   Outcome Measure  Help needed turning from your back to your side while in a flat bed without using bedrails?: A Lot Help needed moving from lying on your back to sitting on the side of a flat bed without using bedrails?: A Lot Help needed moving to and from a bed to a chair (including a wheelchair)?: Total Help needed standing up from a chair using your arms (e.g., wheelchair or bedside chair)?: Total Help needed to walk in hospital room?: Total Help needed climbing 3-5 steps with a railing? : Total 6 Click Score: 8    End of Session Equipment Utilized During Treatment: Oxygen Activity Tolerance: Patient limited by fatigue;Patient limited by lethargy Patient left: in bed;with call bell/phone within reach;with bed alarm set Nurse Communication: Mobility status PT Visit Diagnosis: Muscle  weakness (generalized) (M62.81);Difficulty in walking, not elsewhere classified (R26.2)     Time: [id number] PT Time Calculation (min) (ACUTE ONLY): 26 min  Charges:  $Therapeutic Exercise: 8-22 mins $Therapeutic Activity: 8-22 mins                    Roxanne Gates, PT, DPT    Roxanne Gates 01/14/2020, 3:52 PM

## 2020-01-14 NOTE — Progress Notes (Signed)
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 Patient is now growing out Pseudomonas from his urine as well as from the abscess area, sensitive to Zosyn.  Patient is also COVID-19 positive with acute hypoxia requiring 4 L/min and has associated tachypnea.  He was previously treated for this during prior admission, discharged on 01/01/20.  CTA chest obtained 1/14 ruled out PE.  Subjective 1/14: Patient seen this AM with IV team at bedside.  Patient states feeling "okay".  Denies abdominal or pelvic pain.  He is tachypneic this morning, says he feels slightly short of breath.  Denies chest pain, N/V/D or other complaints.    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 --IR does believe there is some communication between the bladder and they think it needs to be there longer so that can heal up.  No further imaging needed unless his abdominal exam worsens. --continue Vanc and Zosyn for now --ID consulted  --urology and surgery following  Emphysematous cystitis with possible prostate abscess --seems to be responding to vancomycin and  Zosyn day #6 --Zosyn sensitive Pseudomonas is growing out of urine. --Urology is following.  --Foley is to remain in place for 3 to 4 weeks.  Marked leukocytosis - improving with antibiotics, but increased again today Improved after percutaneous drainage 56--36--26--22--22--24--32 today  History of COVID-19pneumonia --Diagnosed with Covid on 12/30/2019 --s/p remdesivir and steroids discharged 01/01/2020 --persistent groundglass opacities in bilateral lower lobes and right middle lobe on CT most likely post-Covid inflammation. --patient without respiratory symptoms initially, but now with tachypnea and 4 L/min oxygen requirement.    Atrial fibrillation, chronic - rate controlled --continue Eliquis --continueamiodarone, metoprolol for rate control  Coronary disease, secondary prevention Hypertension Chronic diastolic CHF --Euvolemic, blood pressure normal --Continueamlodipine, metoprolol --Hold losartan for now, add back if blood pressure warrants. --Patient does not appear to be on antiplatelet agent at home.  BPH Patient had Foley placed by urology, will need to be in for 3 weeks with outpatient follow-up with urology.   --Continuetamsulosin and finasteride  GERD Continuepantoprazole  Seizure disorder Continues Keppra  Dementia without behavioral disturbance Monitor for behavioral disturbance  High risk of malnutrition He has moderate muscle mass and fat loss.  Longer-term plans Per Dr. Jamse Arn note of 01/12/20: "Conversation1/11/21with daughter Jimmy Moore and her husband Jimmy Moore who is a Software engineer about long-term plans for care for Mr. Jimmy Moore. I noted it could be very difficult to eradicate abscess without surgery and long-term outcomelooks challenging. She states she will discuss the situation with her brother and sisters but at this point to continue with imaging and treatment as we are doing. She rightbring up the possibility of palliative care  with them. On rediscussion 01/12/2020 with son-in-lawBuddywire I updated him about marked improvement  in abdominal exam as well as recommendations of radiology to continue present course and no further recommendations for imaging. He is concerned about patient's nutritional status and not wanting to eat which I concur with. Will place dietary consult for possible supplements. Patient is DNR"   DVT prophylaxis: on Eliquis   Code Status: DNR  Family Communication: none at bedside  Disposition Plan:  Pending clinical improvement and PT evaluation.  Urology, general surgery and ID following.    Patient requires continued hospital level care given need for IV antibiotics for intraabdominal abscess with possible fistula, in addition to acute hypoxic respiratory failure.   Consultants:   Urology  General surgery  Procedures: Placement of abdominal abscess drain percutaneously by IR  Antimicrobials:   Vancomycin  Zosyn   Objective: Vitals:   01/14/20 0534 01/14/20 0902 01/14/20 1104 01/14/20 1355  BP: (!) 169/87 (!) 152/69 (!) 148/71 125/71  Pulse: 97 96 94 (!) 102  Resp: (!) 30 (!) 30 (!) 35 (!) 32  Temp: 97.8 F (36.6 C) 98 F (36.7 C) (!) 97.4 F (36.3 C) 98.4 F (36.9 C)  TempSrc: Oral Oral Oral Oral  SpO2: 93% 92% 92% 93%  Weight:      Height:        Intake/Output Summary (Last 24 hours) at 01/14/2020 1537 Last data filed at 01/14/2020 1100 Gross per 24 hour  Intake 682.04 ml  Output 750 ml  Net -67.96 ml   Filed Weights   01/02/2020 0150 01/09/20 0009 01/13/20 1348  Weight: 87.9 kg 83.6 kg 90.1 kg    Examination:  General exam: awake, alert, no acute distress, obese Respiratory system: clear to auscultation anteriorly (patient having IV placed so unable to sit up), no wheezes, rales or rhonchi, mildly increased normal respiratory effort.  On 4 L/min.  Cardiovascular system: normal S1/S2, RRR, no pedal edema.   Gastrointestinal system: soft, non-tender,  non-distended abdomen, normal bowel sounds. Minimal serosanginous fluid in JP drain Central nervous system: alert and oriented x3. no gross focal neurologic deficits, normal speech Extremities: moves all, no cyanosis, normal tone Skin: dry, intact, normal temperature, appears pale Psychiatry: normal mood, congruent affect, judgement and insight appear normal    Data Reviewed: I have personally reviewed following labs and imaging studies  CBC: Recent Labs  Lab 01/10/2020 0232 01/19/2020 0232 01/09/20 [id number] 01/09/20 0608 01/10/20 0515 01/11/20 0611 01/12/20 0505 01/13/20 0517 01/14/20 0450  WBC 56.5*   < > 36.1*   < > 26.8* 22.2* 21.9* 24.4* 32.7*  NEUTROABS 39.4*  --  26.6*  --   --   --   --   --  24.4*  HGB 13.7   < > 11.6*   < > 11.4* 11.2* 11.3* 10.7* 11.1*  HCT 40.7   < > 35.8*   < > 34.2* 33.8* 33.6* 31.3* 32.4*  MCV 91.1   < > 93.7   < > 92.4 91.4 91.1 91.3 90.8  PLT 214   < > 151   < > 134* 142* 129* 120* 118*   < > = values in this interval not displayed.   Basic Metabolic Panel: Recent Labs  Lab 01/10/20 0515 01/11/20 0611 01/12/20 0505 01/13/20 0517 01/14/20 0450  NA 134* 134* 134* 136 138  K 3.6 3.5 3.2* 3.4* 3.3*  CL 104 105 104 105 105  CO2 18* 20* 23 24 25   GLUCOSE 63* 78 80 92 129*  BUN 15 10 9 8 11   CREATININE 0.76 0.54* 0.50* 0.50* 0.55*  CALCIUM 7.4* 7.3* 7.5* 7.5* 7.8*  MG  --   --   --   --  1.6*   GFR: Estimated Creatinine Clearance: 78.4 mL/min (A) (by C-G formula based on SCr of 0.55 mg/dL (L)). Liver Function Tests: Recent Labs  Lab 01/10/2020 0232 01/09/20 0608 01/11/20 0611 01/12/20 0505  AST 18 14* 15 16  ALT 20 14 15 17   ALKPHOS 62 62 48 49  BILITOT 1.1 1.0 1.1 1.2  PROT 6.3* 5.3* 5.2* 5.1*  ALBUMIN 2.6* 2.1* 2.0* 1.9*   No results for input(s): LIPASE, AMYLASE in the last 168 hours. No results for input(s): AMMONIA in the last 168 hours. Coagulation Profile: No results for input(s): INR, PROTIME in the last 168 hours. Cardiac  Enzymes: No results for input(s): CKTOTAL, CKMB, CKMBINDEX, TROPONINI in the last 168 hours. BNP (last 3 results) No results for input(s): PROBNP in the last 8760 hours. HbA1C: No results for input(s): HGBA1C in the last 72 hours. CBG: No results for input(s): GLUCAP in the last 168 hours. Lipid Profile: No results for input(s): CHOL, HDL, LDLCALC, TRIG, CHOLHDL, LDLDIRECT in the last 72 hours. Thyroid Function Tests: No results for input(s): TSH, T4TOTAL, FREET4, T3FREE, THYROIDAB in the last 72 hours. Anemia Panel: No results for input(s): VITAMINB12, FOLATE, FERRITIN, TIBC, IRON, RETICCTPCT in the last 72 hours. Sepsis Labs: Recent Labs  Lab 01/29/2020 0232  LATICACIDVEN 1.3    Recent Results (from the past 240 hour(s))  Urine culture     Status: Abnormal   Collection Time: 01/06/2020  2:32 AM   Specimen: Urine, Random  Result Value Ref Range Status   Specimen Description   Final    URINE, RANDOM Performed at [facility], [address], [place], [place] [address]    Special Requests   Final    NONE Performed at [facility], [address] [place], [place] [address]    Culture >=100,000 COLONIES/mL PSEUDOMONAS AERUGINOSA (A)  Final   Report Status 01/10/2020 FINAL  Final   Organism ID, Bacteria PSEUDOMONAS AERUGINOSA (A)  Final      Susceptibility   Pseudomonas aeruginosa - MIC*    CEFTAZIDIME 2 SENSITIVE Sensitive     CIPROFLOXACIN <=0.25 SENSITIVE Sensitive     GENTAMICIN <=1 SENSITIVE Sensitive     IMIPENEM >=16 RESISTANT Resistant     PIP/TAZO <=4 SENSITIVE Sensitive     CEFEPIME 2 SENSITIVE Sensitive     * >=100,000 COLONIES/mL PSEUDOMONAS AERUGINOSA  Blood culture (routine x 2)     Status: None   Collection Time: 01/07/2020  5:11 AM   Specimen: BLOOD  Result Value Ref Range Status   Specimen Description BLOOD LEFT ASSIST CONTROL  Final   Special Requests   Final    BOTTLES DRAWN AEROBIC AND ANAEROBIC Blood Culture adequate volume    Culture   Final    NO GROWTH 5 DAYS Performed at [facility], [address], [place],  [address]    Report Status 01/13/2020 FINAL  Final  Blood culture (routine x 2)     Status: None   Collection Time: 01/02/2020  5:12 AM   Specimen: BLOOD  Result Value Ref Range Status   Specimen Description BLOOD RIGHT ASSIST CONTROL  Final   Special Requests   Final    BOTTLES DRAWN AEROBIC AND ANAEROBIC Blood Culture adequate volume   Culture   Final    NO GROWTH 5 DAYS Performed at [facility], [place],  [place] [address]    Report Status 01/13/2020 FINAL  Final  Aerobic/Anaerobic Culture (surgical/deep wound)     Status: None   Collection Time: 01/15/2020  5:30 PM   Specimen: Abscess  Result Value Ref Range Status   Specimen Description ABSCESS DRAINAGE  Final   Special Requests NONE  Final   Gram Stain   Final    ABUNDANT WBC PRESENT, PREDOMINANTLY PMN NO ORGANISMS SEEN    Culture   Final    FEW PSEUDOMONAS AERUGINOSA FEW METHICILLIN RESISTANT STAPHYLOCOCCUS AUREUS NO ANAEROBES ISOLATED Performed at [facility], [place] [address], [place], [place] [address]    Report Status 01/13/2020 FINAL  Final   Organism ID, Bacteria PSEUDOMONAS AERUGINOSA  Final   Organism ID, Bacteria METHICILLIN RESISTANT STAPHYLOCOCCUS AUREUS  Final      Susceptibility   Methicillin resistant staphylococcus aureus - MIC*    CIPROFLOXACIN >=8 RESISTANT Resistant     ERYTHROMYCIN >=8 RESISTANT Resistant     GENTAMICIN <=0.5 SENSITIVE Sensitive     OXACILLIN >=4 RESISTANT Resistant     TETRACYCLINE <=1 SENSITIVE Sensitive     VANCOMYCIN 1 SENSITIVE Sensitive     TRIMETH/SULFA <=10 SENSITIVE Sensitive     CLINDAMYCIN >=8 RESISTANT Resistant     RIFAMPIN <=0.5 SENSITIVE Sensitive     Inducible Clindamycin NEGATIVE Sensitive     * FEW METHICILLIN RESISTANT STAPHYLOCOCCUS AUREUS   Pseudomonas aeruginosa - MIC*    CEFTAZIDIME 4 SENSITIVE Sensitive      CIPROFLOXACIN <=0.25 SENSITIVE Sensitive     GENTAMICIN <=1 SENSITIVE Sensitive     IMIPENEM >=16 RESISTANT Resistant     PIP/TAZO <=4 SENSITIVE Sensitive     CEFEPIME 2 SENSITIVE Sensitive     * FEW PSEUDOMONAS AERUGINOSA         Radiology Studies: CT ANGIO CHEST PE W OR WO CONTRAST  Result Date: 01/14/2020 CLINICAL DATA:  COVID-19 positive. Tachypnea. Hypoxia. Inpatient. Lower abdominal abscess status post percutaneous drainage on 01/24/2020. EXAM: CT ANGIOGRAPHY CHEST CT ABDOMEN AND PELVIS WITH CONTRAST TECHNIQUE: Multidetector CT imaging of the chest was performed using the standard protocol during bolus administration of intravenous contrast. Multiplanar CT image reconstructions and MIPs were obtained to evaluate the vascular anatomy. Multidetector CT imaging of the abdomen and pelvis was performed using the standard protocol during bolus administration of intravenous contrast. CONTRAST:  156mL OMNIPAQUE IOHEXOL 350 MG/ML SOLN COMPARISON:  01/10/2020 CT abdomen/pelvis. 12/30/2019 chest radiograph. FINDINGS: CTA CHEST FINDINGS Cardiovascular: The study is low-to-moderate quality for the evaluation of pulmonary embolism, with substantial motion degradation. There are no convincing filling defects in the central, lobar, segmental or subsegmental pulmonary artery branches to suggest acute pulmonary embolism. Atherosclerotic nonaneurysmal thoracic aorta. Dilated main pulmonary artery (3.6 cm diameter). Top-normal heart size. No significant pericardial fluid/thickening. Left anterior descending and right coronary atherosclerosis. Mediastinum/Nodes: Hypodense bilateral thyroid nodules, largest 2.4 cm on the left. Unremarkable esophagus. No pathologically enlarged axillary, mediastinal or hilar lymph nodes. Lungs/Pleura: No pneumothorax. Small dependent bilateral pleural effusions. Extensive patchy consolidation and ground-glass opacity in the left greater than right lungs involving all lung lobes. No  lung masses or discrete pulmonary nodules on these motion degraded images. Musculoskeletal: No aggressive appearing focal osseous lesions. Intact sternotomy wires. Moderate thoracic spondylosis. Review of the MIP images confirms the above findings. CT ABDOMEN and PELVIS FINDINGS Hepatobiliary: Normal liver with no liver mass. Normal gallbladder with no radiopaque cholelithiasis. No biliary ductal dilatation. Pancreas: Normal, with no mass or duct dilation. Spleen: Normal size. No  mass. Adrenals/Urinary Tract: Normal right adrenal. Left adrenal 2.0 cm nodule with density 15 HU, unchanged using similar measurement technique in the short interval. No hydronephrosis. Scattered subcentimeter hypodense renal cortical lesions in both kidneys are too small to characterize and are unchanged. Hypodense exophytic 1.3 cm renal cortical lesion in the posterior lower left kidney (series 505/image 44), stable. Normal caliber ureters. Decompressed urinary bladder with chronic diffuse bladder wall thickening. Nonspecific gas within nondependent bladder lumen is decreased. Foley catheter balloon inflated within the prostatic urethra with Foley tip at the vesicourethral junction. Stomach/Bowel: Normal non-distended stomach. Left-sided percutaneous pigtail drain terminates in the anterior midline lower peritoneal cavity with nearly resolved fluid collection in this location, now measuring 2.6 x 0.7 cm (series 505/image 66), previously 5.3 x 2.6 cm. No residual gas within this collection. Tip of drain remains in close proximity to the dome of the bladder. Fat stranding surrounding this collection has decreased. Normal caliber small bowel loops with no small bowel wall thickening. Appendix not discretely visualized. Scattered mild colonic diverticulosis with no large bowel wall thickening or acute pericolonic fat stranding. Vascular/Lymphatic: Atherosclerotic nonaneurysmal abdominal aorta. Patent portal, splenic, hepatic and renal veins.  No pathologically enlarged lymph nodes in the abdomen or pelvis. Reproductive: Marked prostatomegaly. Other: No pneumoperitoneum. No ascites. No new focal fluid collections. Musculoskeletal: No aggressive appearing focal osseous lesions. Marked lumbar spondylosis. Review of the MIP images confirms the above findings. IMPRESSION: 1. Limited motion degraded scan with no evidence of acute pulmonary embolism. 2. Extensive patchy consolidation and ground-glass opacity in the left greater than right lungs compatible with multilobar pneumonia due to reported COVID-19. 3. Small dependent bilateral pleural effusions. 4. Dilated main pulmonary artery, suggesting pulmonary arterial hypertension. 5. Near complete resolution of anterior midline lower peritoneal cavity fluid collection status post percutaneous drainage. Tip of percutaneous drain remains in close proximity to the dome of the bladder, with fistulization of this residual collection with the bladder lumen not excluded on the basis of this scan. Decreased gas within the nondependent bladder. 6. Foley catheter balloon is still dilated within the prostatic urethra with Foley tip at the vesicourethral junction. Recommend repositioning/replacement. Bladder is decompressed. No hydronephrosis. 7. Chronic mild diffuse bladder wall thickening is nonspecific and probably due to chronic bladder outlet obstruction by the markedly enlarged prostate. 8. Stable left adrenal nodule, probably an adenoma, for which follow-up adrenal protocol CT abdomen without and with IV contrast may be considered in 12 months. 9. Mild scattered colonic diverticulosis. 10.  Aortic Atherosclerosis (ICD10-I70.0). Electronically Signed   By: Ilona Sorrel M.D.   On: 01/14/2020 15:07   CT ABDOMEN PELVIS W CONTRAST  Result Date: 01/14/2020 CLINICAL DATA:  COVID-19 positive. Tachypnea. Hypoxia. Inpatient. Lower abdominal abscess status post percutaneous drainage on 01/02/2020. EXAM: CT ANGIOGRAPHY CHEST  CT ABDOMEN AND PELVIS WITH CONTRAST TECHNIQUE: Multidetector CT imaging of the chest was performed using the standard protocol during bolus administration of intravenous contrast. Multiplanar CT image reconstructions and MIPs were obtained to evaluate the vascular anatomy. Multidetector CT imaging of the abdomen and pelvis was performed using the standard protocol during bolus administration of intravenous contrast. CONTRAST:  160mL OMNIPAQUE IOHEXOL 350 MG/ML SOLN COMPARISON:  01/15/2020 CT abdomen/pelvis. 12/30/2019 chest radiograph. FINDINGS: CTA CHEST FINDINGS Cardiovascular: The study is low-to-moderate quality for the evaluation of pulmonary embolism, with substantial motion degradation. There are no convincing filling defects in the central, lobar, segmental or subsegmental pulmonary artery branches to suggest acute pulmonary embolism. Atherosclerotic nonaneurysmal thoracic aorta. Dilated main pulmonary  artery (3.6 cm diameter). Top-normal heart size. No significant pericardial fluid/thickening. Left anterior descending and right coronary atherosclerosis. Mediastinum/Nodes: Hypodense bilateral thyroid nodules, largest 2.4 cm on the left. Unremarkable esophagus. No pathologically enlarged axillary, mediastinal or hilar lymph nodes. Lungs/Pleura: No pneumothorax. Small dependent bilateral pleural effusions. Extensive patchy consolidation and ground-glass opacity in the left greater than right lungs involving all lung lobes. No lung masses or discrete pulmonary nodules on these motion degraded images. Musculoskeletal: No aggressive appearing focal osseous lesions. Intact sternotomy wires. Moderate thoracic spondylosis. Review of the MIP images confirms the above findings. CT ABDOMEN and PELVIS FINDINGS Hepatobiliary: Normal liver with no liver mass. Normal gallbladder with no radiopaque cholelithiasis. No biliary ductal dilatation. Pancreas: Normal, with no mass or duct dilation. Spleen: Normal size. No mass.  Adrenals/Urinary Tract: Normal right adrenal. Left adrenal 2.0 cm nodule with density 15 HU, unchanged using similar measurement technique in the short interval. No hydronephrosis. Scattered subcentimeter hypodense renal cortical lesions in both kidneys are too small to characterize and are unchanged. Hypodense exophytic 1.3 cm renal cortical lesion in the posterior lower left kidney (series 505/image 44), stable. Normal caliber ureters. Decompressed urinary bladder with chronic diffuse bladder wall thickening. Nonspecific gas within nondependent bladder lumen is decreased. Foley catheter balloon inflated within the prostatic urethra with Foley tip at the vesicourethral junction. Stomach/Bowel: Normal non-distended stomach. Left-sided percutaneous pigtail drain terminates in the anterior midline lower peritoneal cavity with nearly resolved fluid collection in this location, now measuring 2.6 x 0.7 cm (series 505/image 66), previously 5.3 x 2.6 cm. No residual gas within this collection. Tip of drain remains in close proximity to the dome of the bladder. Fat stranding surrounding this collection has decreased. Normal caliber small bowel loops with no small bowel wall thickening. Appendix not discretely visualized. Scattered mild colonic diverticulosis with no large bowel wall thickening or acute pericolonic fat stranding. Vascular/Lymphatic: Atherosclerotic nonaneurysmal abdominal aorta. Patent portal, splenic, hepatic and renal veins. No pathologically enlarged lymph nodes in the abdomen or pelvis. Reproductive: Marked prostatomegaly. Other: No pneumoperitoneum. No ascites. No new focal fluid collections. Musculoskeletal: No aggressive appearing focal osseous lesions. Marked lumbar spondylosis. Review of the MIP images confirms the above findings. IMPRESSION: 1. Limited motion degraded scan with no evidence of acute pulmonary embolism. 2. Extensive patchy consolidation and ground-glass opacity in the left greater  than right lungs compatible with multilobar pneumonia due to reported COVID-19. 3. Small dependent bilateral pleural effusions. 4. Dilated main pulmonary artery, suggesting pulmonary arterial hypertension. 5. Near complete resolution of anterior midline lower peritoneal cavity fluid collection status post percutaneous drainage. Tip of percutaneous drain remains in close proximity to the dome of the bladder, with fistulization of this residual collection with the bladder lumen not excluded on the basis of this scan. Decreased gas within the nondependent bladder. 6. Foley catheter balloon is still dilated within the prostatic urethra with Foley tip at the vesicourethral junction. Recommend repositioning/replacement. Bladder is decompressed. No hydronephrosis. 7. Chronic mild diffuse bladder wall thickening is nonspecific and probably due to chronic bladder outlet obstruction by the markedly enlarged prostate. 8. Stable left adrenal nodule, probably an adenoma, for which follow-up adrenal protocol CT abdomen without and with IV contrast may be considered in 12 months. 9. Mild scattered colonic diverticulosis. 10.  Aortic Atherosclerosis (ICD10-I70.0). Electronically Signed   By: Ilona Sorrel M.D.   On: 01/14/2020 15:07        Scheduled Meds:  amiodarone  100 mg Oral Daily   amLODipine  5  mg Oral Daily   apixaban  2.5 mg Oral BID   Chlorhexidine Gluconate Cloth  6 each Topical Daily   feeding supplement (ENSURE ENLIVE)  237 mL Oral TID BM   finasteride  5 mg Oral Daily   gabapentin  100 mg Oral TID   levETIRAcetam  250 mg Oral Daily   levETIRAcetam  500 mg Oral QPM   metoprolol succinate  25 mg Oral Daily   multivitamin with minerals  1 tablet Oral Daily   pantoprazole  40 mg Oral Daily   sodium chloride flush  5 mL Intracatheter Q8H   tamsulosin  0.4 mg Oral Daily   Continuous Infusions:  sodium chloride Stopped (01/13/20 1350)   lactated ringers 100 mL/hr at 01/13/20 2132    piperacillin-tazobactam (ZOSYN)  IV 3.375 g (01/14/20 1510)   vancomycin 2,000 mg (01/14/20 1005)     LOS: 6 days    Time spent: 35 minutes    Ezekiel Slocumb, DO [facility]   If 7PM-7AM, please contact night-coverage [email or web address] Password [facility] 01/14/2020, 3:37 PM

## 2020-01-14 NOTE — Progress Notes (Signed)
ID I spoke to his daughter and son -in law- According to them patient was active and healthy until December. He developed hematuria and then had CT scan and cystoscopy. He then had urinary retention and was [facility]-12/24 when a foley was placed. He was then discharged to [facility]. On 12/30 he presented to [facility] with hematuria and had feer and cough and diagnosed with COVID- he got Remdisivir and steroids and DC to SNF Readmitted on 1/8 with abdominal pain  Pt says he has cough and Sob No pain abdomen The abdominal drain is putting out lot of urine He had a CT abdomen and chest today  Patient Vitals for the past 24 hrs:  BP Temp Temp src Pulse Resp SpO2  01/14/20 1355 125/71 98.4 F (36.9 C) Oral (!) 102 -- 93 %  01/14/20 1104 (!) 148/71 (!) 97.4 F (36.3 C) Oral 94 (!) 35 92 %  01/14/20 0902 (!) 152/69 98 F (36.7 C) Oral 96 (!) 30 92 %  01/14/20 0534 (!) 169/87 97.8 F (36.6 C) Oral 97 (!) 30 93 %  01/14/20 0157 (!) 163/79 99.6 F (37.6 C) Oral (!) 103 (!) 32 93 %  01/13/20 2147 -- -- -- (!) 102 -- 91 %  01/13/20 2144 (!) 162/79 -- -- (!) 103 (!) 30 (!) 87 %  01/13/20 2005 (!) 185/83 98.1 F (36.7 C) Oral (!) 107 (!) 30 93 %  01/13/20 1801 (!) 165/71 99.1 F (37.3 C) Oral (!) 106 (!) 32 91 %  01/13/20 1557 (!) 153/65 98.6 F (37 C) Oral (!) 105 (!) 32 92 %  01/13/20 1553 -- -- -- (!) 103 -- 96 %   O/E awake and alert No distress Chest crepts both sids left > rt abd soft- JP drain- bulb illed with urine Foley catheter has no urine  CBC Latest Ref Rng & Units 01/14/2020 01/13/2020 01/12/2020  WBC 4.0 - 10.5 K/uL 32.7(H) 24.4(H) 21.9(H)  Hemoglobin 13.0 - 17.0 g/dL 11.1(L) 10.7(L) 11.3(L)  Hematocrit 39.0 - 52.0 % 32.4(L) 31.3(L) 33.6(L)  Platelets 150 - 400 K/uL 118(L) 120(L) 129(L)    CMP Latest Ref Rng & Units 01/14/2020 01/13/2020 01/12/2020  Glucose 70 - 99 mg/dL 129(H) 92 80  BUN 8 - 23 mg/dL 11 8 9   Creatinine 0.61 - 1.24 mg/dL  0.55(L) 0.50(L) 0.50(L)  Sodium 135 - 145 mmol/L 138 136 134(L)  Potassium 3.5 - 5.1 mmol/L 3.3(L) 3.4(L) 3.2(L)  Chloride 98 - 111 mmol/L 105 105 104  CO2 22 - 32 mmol/L 25 24 23   Calcium 8.9 - 10.3 mg/dL 7.8(L) 7.5(L) 7.5(L)  Total Protein 6.5 - 8.1 g/dL - - 5.1(L)  Total Bilirubin 0.3 - 1.2 mg/dL - - 1.2  Alkaline Phos 38 - 126 U/L - - 49  AST 15 - 41 U/L - - 16  ALT 0 - 44 U/L - - 17    UC- MRSA and pseudomonas   Impression/Recommendation  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 Because the foley is not in the right place and is in the urethra the urine is collecting in the intraabdominal drain. The size of the intraabdominal collection has decreased significantly  Leucocytosis could be because of the above MRSA and pseudomonas infection  of the bladder and intra abdominal collection which is infected urine. Currently on Vanco and zosyn- risk for renal injury. Also platelet decreasing Discussed with family regarding cipro+ bactrim VS vanco + cefepime They would prefer oral antibiotics which is a reasonable choice.  Discussed the important side effects of quinolone including tendinitis, rupture, encephalopathy, DDI, prolonged QT as he is on amiodarone. Son in Sports coach is a Software engineer and he understands . After  weighing the benefit/ risk of  quinolone it has been decided to that oral medications better than IV.  Will Dc zosyn and swithc to PO cipro today and observe him on it.  Will change vanco to bactrim tomorrow   Afib- on amiodarone and eliquis  Severe prostate enlargement and urinary retention- has foley- which was have to be positioned properly

## 2020-01-14 NOTE — TOC Progression Note (Signed)
Transition of Care [facility]) - Progression Note    Patient Details  Name: Kiefer Sickles MRN: [id number] Date of Birth: 18-May-1929  [facility]) CM/SW Contact  Beverly Sessions, RN Phone Number: 01/14/2020, 3:41 PM  Clinical Narrative:     Received return call from patient's daughter.  She confirms she would like for him to return to [facility] at discharge, in order to build back up his strength and get back home with his wife  Of note per daughter patient has never been diagnosed with dementia.  She states that prior to 1 month ago he was independent and A&O x4       Expected Discharge Plan and Services                                                 Social Determinants of Health (SDOH) Interventions    Readmission Risk Interventions No flowsheet data found.

## 2020-01-14 NOTE — Plan of Care (Signed)
Patient MEWS score 2 all night and during the previous shift. Respirations 32, HR 103 and BP 163/79. NP notified and she says to continue monitoring. Will continue to monitor patient.

## 2020-01-14 NOTE — Progress Notes (Addendum)
Notified MD.JP is leaking around the  Site. Drainage increase dramatically. The foley catheter is leaking even it was swtch. Sat is 84%. Oxygen put back on and O2 went up to 92%.

## 2020-01-14 NOTE — TOC Progression Note (Signed)
Transition of Care [facility]) - Progression Note    Patient Details  Name: Jimmy Moore MRN: [id number] Date of Birth: Feb 25, 1929  Transition of Care [facility]) CM/SW Contact  Beverly Sessions, RN Phone Number: 01/14/2020, 2:36 PM  Clinical Narrative:    Attempted to reach patient by phone to confirm he wishes to return to [facility] at discharge. Unable to reach  Voicemail left for daughter Ms. Reece.  Awaiting return call         Expected Discharge Plan and Services                                                 Social Determinants of Health (SDOH) Interventions    Readmission Risk Interventions No flowsheet data found.

## 2020-01-15 ENCOUNTER — Inpatient Hospital Stay: Payer: Medicare Other

## 2020-01-15 DIAGNOSIS — I4892 Unspecified atrial flutter: Secondary | ICD-10-CM | POA: Diagnosis not present

## 2020-01-15 DIAGNOSIS — N3091 Cystitis, unspecified with hematuria: Secondary | ICD-10-CM

## 2020-01-15 LAB — CBC WITH DIFFERENTIAL/PLATELET
Abs Immature Granulocytes: 1.68 10*3/uL — ABNORMAL HIGH (ref 0.00–0.07)
Basophils Absolute: 0.1 10*3/uL (ref 0.0–0.1)
Basophils Relative: 0 %
Eosinophils Absolute: 0 10*3/uL (ref 0.0–0.5)
Eosinophils Relative: 0 %
HCT: 32.7 % — ABNORMAL LOW (ref 39.0–52.0)
Hemoglobin: 11 g/dL — ABNORMAL LOW (ref 13.0–17.0)
Immature Granulocytes: 5 %
Lymphocytes Relative: 2 %
Lymphs Abs: 0.6 10*3/uL — ABNORMAL LOW (ref 0.7–4.0)
MCH: 31 pg (ref 26.0–34.0)
MCHC: 33.6 g/dL (ref 30.0–36.0)
MCV: 92.1 fL (ref 80.0–100.0)
Monocytes Absolute: 4.7 10*3/uL — ABNORMAL HIGH (ref 0.1–1.0)
Monocytes Relative: 15 %
Neutro Abs: 25.6 10*3/uL — ABNORMAL HIGH (ref 1.7–7.7)
Neutrophils Relative %: 78 %
Platelets: 118 10*3/uL — ABNORMAL LOW (ref 150–400)
RBC: 3.55 MIL/uL — ABNORMAL LOW (ref 4.22–5.81)
RDW: 13.9 % (ref 11.5–15.5)
Smear Review: NORMAL
WBC: 32.7 10*3/uL — ABNORMAL HIGH (ref 4.0–10.5)
nRBC: 0 % (ref 0.0–0.2)

## 2020-01-15 LAB — BASIC METABOLIC PANEL
Anion gap: 8 (ref 5–15)
BUN: 14 mg/dL (ref 8–23)
CO2: 24 mmol/L (ref 22–32)
Calcium: 7.9 mg/dL — ABNORMAL LOW (ref 8.9–10.3)
Chloride: 107 mmol/L (ref 98–111)
Creatinine, Ser: 0.69 mg/dL (ref 0.61–1.24)
GFR calc Af Amer: >60 mL/min (ref >60)
GFR calc non Af Amer: >60 mL/min (ref >60)
Glucose, Bld: 107 mg/dL — ABNORMAL HIGH (ref 70–99)
Potassium: 4 mmol/L (ref 3.5–5.1)
Sodium: 139 mmol/L (ref 135–145)

## 2020-01-15 LAB — MAGNESIUM: Magnesium: 1.7 mg/dL (ref 1.7–2.4)

## 2020-01-15 MED ORDER — FUROSEMIDE 10 MG/ML IJ SOLN
40.0000 mg | Freq: Once | INTRAMUSCULAR | Status: AC
  Administered 2020-01-16: 40 mg via INTRAVENOUS
  Filled 2020-01-15: qty 4

## 2020-01-15 MED ORDER — AMIODARONE HCL IN DEXTROSE 360-4.14 MG/200ML-% IV SOLN
60.0000 mg/h | INTRAVENOUS | Status: AC
  Administered 2020-01-15 (×2): 60 mg/h via INTRAVENOUS
  Filled 2020-01-15 (×2): qty 200

## 2020-01-15 MED ORDER — METOPROLOL TARTRATE 5 MG/5ML IV SOLN: 5.0000 mg | INTRAVENOUS | Status: DC | PRN

## 2020-01-15 MED ORDER — AMIODARONE LOAD VIA INFUSION
150.0000 mg | Freq: Once | INTRAVENOUS | Status: AC
  Administered 2020-01-15: 150 mg via INTRAVENOUS
  Filled 2020-01-15: qty 83.34

## 2020-01-15 MED ORDER — AMIODARONE HCL IN DEXTROSE 360-4.14 MG/200ML-% IV SOLN
30.0000 mg/h | INTRAVENOUS | Status: DC
  Administered 2020-01-15 – 2020-01-16 (×2): 30 mg/h via INTRAVENOUS
  Filled 2020-01-15 (×3): qty 200

## 2020-01-15 MED ORDER — DEXAMETHASONE SODIUM PHOSPHATE 10 MG/ML IJ SOLN
6.0000 mg | Freq: Every day | INTRAMUSCULAR | Status: DC
  Administered 2020-01-15 – 2020-01-18 (×4): 6 mg via INTRAVENOUS
  Filled 2020-01-15 (×2): qty 0.6
  Filled 2020-01-15: qty 1
  Filled 2020-01-15 (×2): qty 0.6

## 2020-01-15 MED ORDER — METOPROLOL TARTRATE 5 MG/5ML IV SOLN
5.0000 mg | INTRAVENOUS | Status: DC | PRN
  Filled 2020-01-15: qty 5

## 2020-01-15 MED ORDER — MAGNESIUM SULFATE 2 GM/50ML IV SOLN
2.0000 g | Freq: Once | INTRAVENOUS | Status: AC
  Administered 2020-01-16: 2 g via INTRAVENOUS
  Filled 2020-01-15: qty 50

## 2020-01-15 MED ORDER — SODIUM CHLORIDE 0.9 % IV SOLN
2.0000 g | Freq: Two times a day (BID) | INTRAVENOUS | Status: DC
  Administered 2020-01-16 – 2020-01-21 (×11): 2 g via INTRAVENOUS
  Filled 2020-01-15 (×15): qty 2

## 2020-01-15 NOTE — Progress Notes (Signed)
Patient daughter ( susan) notified. Patient was transferred to ICU.

## 2020-01-15 NOTE — Progress Notes (Signed)
Patient admitted to ICU-5 from the floor. Report received from Harvey. Daughter notified of transfer and updated on patients status. Patient hooked up to monitor, switched to nasal cannula. CHG bath completed.

## 2020-01-15 NOTE — Progress Notes (Signed)
After nurse reported no drainage from newly placed foley catheter, repeat CT obtained to assess position. Found again terminating in prostate. Given history and communicating fistula with bladder, urology Dr. Diamantina Providence consulted for foley catheter placement. He has been paged and I am awaiting return call.

## 2020-01-15 NOTE — Progress Notes (Signed)
Pharmacy Antibiotic Note  Jimmy Moore is a 86 y.o. 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 Patient admitted from skilled nursing facility. Patient with recent findings of MRSA and Pseudomonas in urine on 1/3 and treatment for COVID-19 pneumonia from 12/30-1/3. Pharmacy was consulted for Vancomycin dosing. This is day #8 of IV antibiotics, renal function has been stable since the previous note, leukocytosis unresolved  Vancomycin Levels: Peak 01/12 1339: 38.7 mcg/mL Trough 01/13 0903: 12 mcg/mL  Plan: continue vancomycin 2g IV Q24hr  obtain serum creatinine daily to monitor renal function  obtain peaks and troughs as clinically inidicated Css: 38.7/11.4 mcg/mL AUC 541.1   Height: 6\' 4"  (193 cm) Weight: 198 lb 10.2 oz (90.1 kg) IBW/kg (Calculated) : 86.8  Temp (24hrs), Avg:98.3 F (36.8 C), Min:97.5 F (36.4 C), Max:98.6 F (37 C)  Recent Labs  Lab 01/11/20 0611 01/12/20 0505 01/12/20 1339 01/13/20 0517 01/13/20 0903 01/14/20 0450 01/15/20 0544  WBC 22.2* 21.9*  --  24.4*  --  32.7* 32.7*  CREATININE 0.54* 0.50*  --  0.50*  --  0.55* 0.69  VANCOTROUGH  --   --   --   --  12*  --   --   VANCOPEAK  --   --  37  --   --   --   --     Estimated Creatinine Clearance: 78.4 mL/min (by C-G formula based on SCr of 0.69 mg/dL).    No Known Allergies  Antimicrobials this admission: Metronidazole 1/8 x 1 Zosyn 1/8 >> 1/14 Ciprofloxacin 1/14>> Vancomycin 1/8 >>  Microbiology results: 1/8 BCx: no growth final 1/8 UCx: P aeruginosa 1/8 WCx P aeruginosa, MRSA   Thank you for allowing pharmacy to be a part of this patient's care.  Dallie Piles 01/15/2020 3:00 PM

## 2020-01-15 NOTE — Progress Notes (Signed)
Primary nurse notified Sharion Settler NP in regard to urine not going into foley catheter but JP drain. Orders received for bladder scan. No results. Orders received for Stat CT scan. Primary nurse to continue to monitor.

## 2020-01-15 NOTE — Significant Event (Signed)
Rapid Response Event Note  Overview: Time Called: 1615(not full page, RN just called to check on patient) Arrival Time: 1620 Event Type: Cardiac  Initial Focused Assessment:  Rapid response RN arrived in patient's room with patient sitting up in bed with Beth RN and Armelle RN at bedside with patient. Patient was tachypneic in the upper 30s with minorly increased work of breath but no reported shortness of breath. Patient was on 10L with the non-rebreather mask with oxygen saturations low to upper 90s. Patient was in atrial fibrillation in the 130s, confirmed by 12 lead EKG. Per patient's RNs MD had first ordered IV metoprolol but patient's SBP was in the 90s. (See vital sign flowsheets for full vital signs). MD changed orders to amiodarone bolus. SWOT RN Velna Hatchet was in communication with MD outside of room (patient on COVID isolation) and MD communicated to [facility] that if bolus sufficient to convert patient to sinus rhythm or at least get patient out of RVR patient may be able to remain on 2C.  Interventions: Rapid response RN adminstered the amiodarone bolus with no improvement in heart rate (but also no decrease in blood pressure). Per MD orders patient started on amiodarone infusion by this RN and transferred to ICU 5.  Plan of Care (if not transferred): Transferred to ICU 5.  Event Summary: Name of Physician Notified: Dr. Arbutus Ped at 1610    at      [place] Time: 1730 (when fully checked into ICU bed 5)     [place], [facility]

## 2020-01-15 NOTE — Progress Notes (Addendum)
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 Patient is now growing out Pseudomonas from his urine as well as from the abscess area, sensitive to Zosyn.  Patient is also COVID-19 positive with acute hypoxia requiring 4 L/min and has associated tachypnea.  He was previously treated for this during prior admission, discharged on 01/01/20.  CTA chest obtained 1/14 negative for PE.  Increased oxygen requirements and tachypnea 1/15, in addition to A-flutter with RVR requiring amiodarone drip for rate control.  Subjective 1/15: Patient sleeping but awoke to voice.  States the he feels good.  He appears dyspneic, admits maybe a little short of breath.  No other complaints except tired.  Mouth breathing with nasal cannula in place, discussed with RN placing mask instead to help oxygenation.  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 --IR recommended keeping the drain at least until 01/16/2020,if not longer. --IR doesbelieve there is some communication between the bladder and they think it needs to be there longer so that can heal up.  No  further imagingneededunless his abdominal exam worsens. --antibiotics per ID - vanc, cefepime --DO NOT replace or reposition Foley without contact urology.  Acute Hypoxic Respiratory Failure with hypoxia - in setting of recent Covid PNA Up to 15 L/min HFNC today, from 4 L on 1/14.   CTA chest 1/14 was negative for PE but showed extensive patchy consolidation and ground-glass opacity in the left greater than right lungs involving all lung lobes, enlarged main pulmonary artery.  Patient also with a-flutter RVR. --start IV dexamethasone in case this is post-covid issue --stop fluids, give 40 mg IV Lasix, net +fluid balance --supplement O2 for sat > 90% --wean down O2 as tolerated --HR control as below  Atrial Flutter with RVR - new onset 1/15.  BP too soft for beta/calcium channel blockers.  Not controlled with amio bolus alone.  Transferred to stepdown and now on amiodarone gtt.  Likely due to hypoxia.   --cardiology consult in AM --continue amio drip --maintain HR < 110  --maintain K>4, Mg>2  Chronic A-Fib --continue Eliquis --PO amio on hold while on gtt --continuehome Toprol   History of COVID-19pneumonia --Diagnosed with Covid on 12/30/2019 --s/p remdesivir and steroids discharged 01/01/2020 --persistentgroundglass opacities in bilateral lower lobes and right middle lobe on CT most likelypost-Covid inflammation. --patient without respiratory symptoms initially, but now with tachypnea and 15 L/min oxygen requirement.   Plan as above.   Marked leukocytosis- improving with antibiotics, but increased again today Improved after percutaneous drainage 56-->36--26--22--22--24-->32   Coronary disease, secondary prevention Hypertension Chronic diastolic CHF --Euvolemic, blood pressure normal --Continueamlodipine, metoprolol --Hold losartan for now, add back if blood pressure warrants. --Patient does not appear to be on antiplatelet agent at home.  BPH Patient had Foley  placed by urology, will need to be in for 3 weeks with outpatient follow-up with urology. --Continuetamsulosin and finasteride  GERD Continuepantoprazole  Seizure disorder Continues Keppra  Dementia without behavioral disturbance Monitor for behavioral disturbance  High risk of malnutrition He has moderate muscle mass and fat loss.  Longer-term plans Per Dr. Jamse Arn note of 01/12/20: "Conversation1/11/21with daughter Jimmy Moore and her husband Jimmy Moore who is a Software engineer about long-term plans for care for Mr. Jimmy Moore. I noted it could be very difficult to eradicate abscess without surgery and long-term outcomelooks challenging. She states she will discuss the situation with her brother and sisters but at this point to continue with imaging and treatment as we are doing. She rightbring up the possibility of palliative care with them. On rediscussion1/11/2020 with son-in-lawBuddywire I updated him about marked improvement in abdominal exam as well as recommendations of radiology to continue present course and no further recommendations for imaging. He is concerned about patient's nutritional status and not wanting to eat which I concur with. Will place dietary consult for possible supplements. Patient is DNR"   DVT prophylaxis:on Eliquis Code Status: DNR Family Communication:none at bedside Disposition Plan:Pending clinical improvement and PT evaluation. Urology, general surgery and ID following.   Patient requires continued hospital level care given need for IV antibiotics for intraabdominal abscess with possible fistula, in addition to worsening acute hypoxic respiratory failure, atrial flutter with RVR.   Consultants:  Urology  General surgery  Procedures:Placement of abdominal abscess drain percutaneously by IR  Antimicrobials:  Vancomycin  Zosyn    Objective: Vitals:   01/14/20 2000 01/14/20 2000 01/15/20 0628 01/15/20 0631  BP:   (!) 150/68 (!) 156/66   Pulse:  95 (!) 101 100  Resp:  (!) 24 (!) 28   Temp:  98.4 F (36.9 C) 98.5 F (36.9 C)   TempSrc:  Oral Oral   SpO2: 92% 93% (!) 89% 90%  Weight:      Height:        Intake/Output Summary (Last 24 hours) at 01/15/2020 0901 Last data filed at 01/15/2020 0630 Gross per 24 hour  Intake --  Output 670 ml  Net -670 ml   Filed Weights   01/02/2020 0150 01/09/20 0009 01/13/20 1348  Weight: 87.9 kg 83.6 kg 90.1 kg    Examination:  General exam: awake, alert, no acute distress HEENT: dry mucus membranes, hearing grossly normal  Respiratory system: diffuse rhonchi (vs crackles, patient snoring during exam), no wheezes, appears dyspneic, using accessory muscles. Cardiovascular system: normal S1/S2, tachycardic, regular rhythm, exam limited by snoring, trace LE edema.   Gastrointestinal system: soft, non-tender, non-distended abdomen Central nervous system: oriented x3. no gross focal neurologic deficits, normal speech Extremities: moves all, no cyanosis, normal tone Skin: dry, intact, normal temperature    Data Reviewed: I have personally reviewed following labs and imaging studies  CBC: Recent Labs  Lab 01/09/20 0608 01/10/20 0515 01/11/20 0611 01/12/20 0505 01/13/20 0517 01/14/20 0450 01/15/20 0544  WBC 36.1*   < > 22.2* 21.9* 24.4* 32.7* 32.7*  NEUTROABS 26.6*  --   --   --   --  24.4* 25.6*  HGB 11.6*   < > 11.2* 11.3* 10.7* 11.1* 11.0*  HCT 35.8*   < > 33.8* 33.6* 31.3* 32.4* 32.7*  MCV 93.7   < > 91.4 91.1 91.3 90.8 92.1  PLT 151   < > 142* 129* 120* 118* 118*   < > = values in this interval not displayed.   Basic Metabolic  Panel: Recent Labs  Lab 01/11/20 0611 01/12/20 0505 01/13/20 0517 01/14/20 0450 01/15/20 0544  NA 134* 134* 136 138 139  K 3.5 3.2* 3.4* 3.3* 4.0  CL 105 104 105 105 107  CO2 20* 23 24 25 24   GLUCOSE 78 80 92 129* 107*  BUN 10 9 8 11 14   CREATININE 0.54* 0.50* 0.50* 0.55* 0.69  CALCIUM 7.3* 7.5* 7.5* 7.8* 7.9*   MG  --   --   --  1.6* 1.7   GFR: Estimated Creatinine Clearance: 78.4 mL/min (by C-G formula based on SCr of 0.69 mg/dL). Liver Function Tests: Recent Labs  Lab 01/09/20 0608 01/11/20 0611 01/12/20 0505  AST 14* 15 16  ALT 14 15 17   ALKPHOS 62 48 49  BILITOT 1.0 1.1 1.2  PROT 5.3* 5.2* 5.1*  ALBUMIN 2.1* 2.0* 1.9*   No results for input(s): LIPASE, AMYLASE in the last 168 hours. No results for input(s): AMMONIA in the last 168 hours. Coagulation Profile: No results for input(s): INR, PROTIME in the last 168 hours. Cardiac Enzymes: No results for input(s): CKTOTAL, CKMB, CKMBINDEX, TROPONINI in the last 168 hours. BNP (last 3 results) No results for input(s): PROBNP in the last 8760 hours. HbA1C: No results for input(s): HGBA1C in the last 72 hours. CBG: No results for input(s): GLUCAP in the last 168 hours. Lipid Profile: No results for input(s): CHOL, HDL, LDLCALC, TRIG, CHOLHDL, LDLDIRECT in the last 72 hours. Thyroid Function Tests: No results for input(s): TSH, T4TOTAL, FREET4, T3FREE, THYROIDAB in the last 72 hours. Anemia Panel: No results for input(s): VITAMINB12, FOLATE, FERRITIN, TIBC, IRON, RETICCTPCT in the last 72 hours. Sepsis Labs: Recent Labs  Lab 01/14/20 0450  PROCALCITON 0.31    Recent Results (from the past 240 hour(s))  Urine culture     Status: Abnormal   Collection Time: 01/07/2020  2:32 AM   Specimen: Urine, Random  Result Value Ref Range Status   Specimen Description   Final    URINE, RANDOM Performed at [facility], [address], [place], [place] [address]    Special Requests   Final    NONE Performed at [facility], [address] [place], [place] [address]    Culture >=100,000 COLONIES/mL PSEUDOMONAS AERUGINOSA (A)  Final   Report Status 01/10/2020 FINAL  Final   Organism ID, Bacteria PSEUDOMONAS AERUGINOSA (A)  Final      Susceptibility   Pseudomonas aeruginosa - MIC*    CEFTAZIDIME 2 SENSITIVE Sensitive      CIPROFLOXACIN <=0.25 SENSITIVE Sensitive     GENTAMICIN <=1 SENSITIVE Sensitive     IMIPENEM >=16 RESISTANT Resistant     PIP/TAZO <=4 SENSITIVE Sensitive     CEFEPIME 2 SENSITIVE Sensitive     * >=100,000 COLONIES/mL PSEUDOMONAS AERUGINOSA  Blood culture (routine x 2)     Status: None   Collection Time: 01/09/2020  5:11 AM   Specimen: BLOOD  Result Value Ref Range Status   Specimen Description BLOOD LEFT ASSIST CONTROL  Final   Special Requests   Final    BOTTLES DRAWN AEROBIC AND ANAEROBIC Blood Culture adequate volume   Culture   Final    NO GROWTH 5 DAYS Performed at [facility], [address], [place], [place] [address]    Report Status 01/13/2020 FINAL  Final  Blood culture (routine x 2)     Status: None   Collection Time: 01/25/2020  5:12 AM   Specimen: BLOOD  Result Value Ref Range  Status   Specimen Description BLOOD RIGHT ASSIST CONTROL  Final   Special Requests   Final    BOTTLES DRAWN AEROBIC AND ANAEROBIC Blood Culture adequate volume   Culture   Final    NO GROWTH 5 DAYS Performed at [facility], [address] [place], [place] [address]    Report Status 01/13/2020 FINAL  Final  Aerobic/Anaerobic Culture (surgical/deep wound)     Status: None   Collection Time: 01/12/2020  5:30 PM   Specimen: Abscess  Result Value Ref Range Status   Specimen Description ABSCESS DRAINAGE  Final   Special Requests NONE  Final   Gram Stain   Final    ABUNDANT WBC PRESENT, PREDOMINANTLY PMN NO ORGANISMS SEEN    Culture   Final    FEW PSEUDOMONAS AERUGINOSA FEW METHICILLIN RESISTANT STAPHYLOCOCCUS AUREUS NO ANAEROBES ISOLATED Performed at [facility], [place] [address] [place],  [address]    Report Status 01/13/2020 FINAL  Final   Organism ID, Bacteria PSEUDOMONAS AERUGINOSA  Final   Organism ID, Bacteria METHICILLIN RESISTANT STAPHYLOCOCCUS AUREUS  Final      Susceptibility   Methicillin resistant staphylococcus aureus - MIC*     CIPROFLOXACIN >=8 RESISTANT Resistant     ERYTHROMYCIN >=8 RESISTANT Resistant     GENTAMICIN <=0.5 SENSITIVE Sensitive     OXACILLIN >=4 RESISTANT Resistant     TETRACYCLINE <=1 SENSITIVE Sensitive     VANCOMYCIN 1 SENSITIVE Sensitive     TRIMETH/SULFA <=10 SENSITIVE Sensitive     CLINDAMYCIN >=8 RESISTANT Resistant     RIFAMPIN <=0.5 SENSITIVE Sensitive     Inducible Clindamycin NEGATIVE Sensitive     * FEW METHICILLIN RESISTANT STAPHYLOCOCCUS AUREUS   Pseudomonas aeruginosa - MIC*    CEFTAZIDIME 4 SENSITIVE Sensitive     CIPROFLOXACIN <=0.25 SENSITIVE Sensitive     GENTAMICIN <=1 SENSITIVE Sensitive     IMIPENEM >=16 RESISTANT Resistant     PIP/TAZO <=4 SENSITIVE Sensitive     CEFEPIME 2 SENSITIVE Sensitive     * FEW PSEUDOMONAS AERUGINOSA         Radiology Studies: CT ABDOMEN PELVIS WO CONTRAST  Result Date: 01/15/2020 CLINICAL DATA:  Urinary catheter displacement, unable to determine output EXAM: CT ABDOMEN AND PELVIS WITHOUT CONTRAST TECHNIQUE: Multidetector CT imaging of the abdomen and pelvis was performed following the standard protocol without IV contrast. COMPARISON:  January 14, 2020 FINDINGS: Lower chest: The visualized heart size within normal limits. No pericardial fluid/thickening. No hiatal hernia. Coronary artery calcifications are seen. Small bilateral pleural effusions are noted. Patchy ground-glass opacities are seen at both lung bases. Hepatobiliary: Although limited due to the lack of intravenous contrast, normal in appearance without gross focal abnormality. No evidence of calcified gallstones or biliary ductal dilatation. Pancreas:  Unremarkable.  No surrounding inflammatory changes. Spleen: Normal in size. Although limited due to the lack of intravenous contrast, normal in appearance. Adrenals/Urinary Tract: Both adrenal glands appear normal. Bilateral renal atrophy is seen. No hydronephrosis. No renal or collecting system calculi. Foley catheter balloon is seen  again inflated within the prostatic urethra. The tip appears to be just projecting into the partially decompressed bladder. There is a small amount of air seen at the vesicoureteral junction. There is contrast seen within the partially decompressed bladder with diffuse wall thickening and calcifications. Small foci of air seen within the bladder dome with a fistulous tract extending to the left anterior abdominal wall collection. Stomach/Bowel: The stomach, small bowel, and colon are normal  in appearance. No inflammatory changes or obstructive findings. Scattered colonic diverticula are noted. There is a moderate to large amount of colonic stool. Vascular/Lymphatic: There are no enlarged abdominal or pelvic lymph nodes. Scattered aortic atherosclerotic calcifications are seen without aneurysmal dilatation. Reproductive: Enlarged heterogeneous prostate gland. Other: A left lower anterior abdominal wall pigtail catheter is seen. There is a residual small collection measuring 2.9 cm which now appears to have a small amount of contrast seen within it. There are small foci of air at the dome of the bladder with a possible fistulous tract between the collection and the dome of the bladder, series 2, image 78. Musculoskeletal: No acute or significant osseous findings. IMPRESSION: 1. Small bilateral pleural effusions with adjacent patchy airspace consolidation, consistent with COVID pneumonia. 2. Foley catheter balloon dilated within the prosthetic urethra as on prior exam. 3. Contrast within a chronically thickened partially decompressed bladder. 4. Left lower anterior abdominal pigtail catheter within a 2.9 cm collection that now contains contrast, there appears to be a small fistulous tract extending to the bladder dome where there are small foci of air as described above. 5. Diverticulosis without diverticulitis. 6.  Aortic Atherosclerosis (ICD10-I70.0). Electronically Signed   By: Prudencio Pair M.D.   On: 01/15/2020  05:14   CT ANGIO CHEST PE W OR WO CONTRAST  Result Date: 01/14/2020 CLINICAL DATA:  COVID-19 positive. Tachypnea. Hypoxia. Inpatient. Lower abdominal abscess status post percutaneous drainage on 01/02/2020. EXAM: CT ANGIOGRAPHY CHEST CT ABDOMEN AND PELVIS WITH CONTRAST TECHNIQUE: Multidetector CT imaging of the chest was performed using the standard protocol during bolus administration of intravenous contrast. Multiplanar CT image reconstructions and MIPs were obtained to evaluate the vascular anatomy. Multidetector CT imaging of the abdomen and pelvis was performed using the standard protocol during bolus administration of intravenous contrast. CONTRAST:  175mL OMNIPAQUE IOHEXOL 350 MG/ML SOLN COMPARISON:  01/14/2020 CT abdomen/pelvis. 12/30/2019 chest radiograph. FINDINGS: CTA CHEST FINDINGS Cardiovascular: The study is low-to-moderate quality for the evaluation of pulmonary embolism, with substantial motion degradation. There are no convincing filling defects in the central, lobar, segmental or subsegmental pulmonary artery branches to suggest acute pulmonary embolism. Atherosclerotic nonaneurysmal thoracic aorta. Dilated main pulmonary artery (3.6 cm diameter). Top-normal heart size. No significant pericardial fluid/thickening. Left anterior descending and right coronary atherosclerosis. Mediastinum/Nodes: Hypodense bilateral thyroid nodules, largest 2.4 cm on the left. Unremarkable esophagus. No pathologically enlarged axillary, mediastinal or hilar lymph nodes. Lungs/Pleura: No pneumothorax. Small dependent bilateral pleural effusions. Extensive patchy consolidation and ground-glass opacity in the left greater than right lungs involving all lung lobes. No lung masses or discrete pulmonary nodules on these motion degraded images. Musculoskeletal: No aggressive appearing focal osseous lesions. Intact sternotomy wires. Moderate thoracic spondylosis. Review of the MIP images confirms the above findings. CT  ABDOMEN and PELVIS FINDINGS Hepatobiliary: Normal liver with no liver mass. Normal gallbladder with no radiopaque cholelithiasis. No biliary ductal dilatation. Pancreas: Normal, with no mass or duct dilation. Spleen: Normal size. No mass. Adrenals/Urinary Tract: Normal right adrenal. Left adrenal 2.0 cm nodule with density 15 HU, unchanged using similar measurement technique in the short interval. No hydronephrosis. Scattered subcentimeter hypodense renal cortical lesions in both kidneys are too small to characterize and are unchanged. Hypodense exophytic 1.3 cm renal cortical lesion in the posterior lower left kidney (series 505/image 44), stable. Normal caliber ureters. Decompressed urinary bladder with chronic diffuse bladder wall thickening. Nonspecific gas within nondependent bladder lumen is decreased. Foley catheter balloon inflated within the prostatic urethra with Foley tip at  the vesicourethral junction. Stomach/Bowel: Normal non-distended stomach. Left-sided percutaneous pigtail drain terminates in the anterior midline lower peritoneal cavity with nearly resolved fluid collection in this location, now measuring 2.6 x 0.7 cm (series 505/image 66), previously 5.3 x 2.6 cm. No residual gas within this collection. Tip of drain remains in close proximity to the dome of the bladder. Fat stranding surrounding this collection has decreased. Normal caliber small bowel loops with no small bowel wall thickening. Appendix not discretely visualized. Scattered mild colonic diverticulosis with no large bowel wall thickening or acute pericolonic fat stranding. Vascular/Lymphatic: Atherosclerotic nonaneurysmal abdominal aorta. Patent portal, splenic, hepatic and renal veins. No pathologically enlarged lymph nodes in the abdomen or pelvis. Reproductive: Marked prostatomegaly. Other: No pneumoperitoneum. No ascites. No new focal fluid collections. Musculoskeletal: No aggressive appearing focal osseous lesions. Marked lumbar  spondylosis. Review of the MIP images confirms the above findings. IMPRESSION: 1. Limited motion degraded scan with no evidence of acute pulmonary embolism. 2. Extensive patchy consolidation and ground-glass opacity in the left greater than right lungs compatible with multilobar pneumonia due to reported COVID-19. 3. Small dependent bilateral pleural effusions. 4. Dilated main pulmonary artery, suggesting pulmonary arterial hypertension. 5. Near complete resolution of anterior midline lower peritoneal cavity fluid collection status post percutaneous drainage. Tip of percutaneous drain remains in close proximity to the dome of the bladder, with fistulization of this residual collection with the bladder lumen not excluded on the basis of this scan. Decreased gas within the nondependent bladder. 6. Foley catheter balloon is still dilated within the prostatic urethra with Foley tip at the vesicourethral junction. Recommend repositioning/replacement. Bladder is decompressed. No hydronephrosis. 7. Chronic mild diffuse bladder wall thickening is nonspecific and probably due to chronic bladder outlet obstruction by the markedly enlarged prostate. 8. Stable left adrenal nodule, probably an adenoma, for which follow-up adrenal protocol CT abdomen without and with IV contrast may be considered in 12 months. 9. Mild scattered colonic diverticulosis. 10.  Aortic Atherosclerosis (ICD10-I70.0). Electronically Signed   By: Ilona Sorrel M.D.   On: 01/14/2020 15:07   CT ABDOMEN PELVIS W CONTRAST  Result Date: 01/14/2020 CLINICAL DATA:  COVID-19 positive. Tachypnea. Hypoxia. Inpatient. Lower abdominal abscess status post percutaneous drainage on 01/13/2020. EXAM: CT ANGIOGRAPHY CHEST CT ABDOMEN AND PELVIS WITH CONTRAST TECHNIQUE: Multidetector CT imaging of the chest was performed using the standard protocol during bolus administration of intravenous contrast. Multiplanar CT image reconstructions and MIPs were obtained to evaluate  the vascular anatomy. Multidetector CT imaging of the abdomen and pelvis was performed using the standard protocol during bolus administration of intravenous contrast. CONTRAST:  158mL OMNIPAQUE IOHEXOL 350 MG/ML SOLN COMPARISON:  01/20/2020 CT abdomen/pelvis. 12/30/2019 chest radiograph. FINDINGS: CTA CHEST FINDINGS Cardiovascular: The study is low-to-moderate quality for the evaluation of pulmonary embolism, with substantial motion degradation. There are no convincing filling defects in the central, lobar, segmental or subsegmental pulmonary artery branches to suggest acute pulmonary embolism. Atherosclerotic nonaneurysmal thoracic aorta. Dilated main pulmonary artery (3.6 cm diameter). Top-normal heart size. No significant pericardial fluid/thickening. Left anterior descending and right coronary atherosclerosis. Mediastinum/Nodes: Hypodense bilateral thyroid nodules, largest 2.4 cm on the left. Unremarkable esophagus. No pathologically enlarged axillary, mediastinal or hilar lymph nodes. Lungs/Pleura: No pneumothorax. Small dependent bilateral pleural effusions. Extensive patchy consolidation and ground-glass opacity in the left greater than right lungs involving all lung lobes. No lung masses or discrete pulmonary nodules on these motion degraded images. Musculoskeletal: No aggressive appearing focal osseous lesions. Intact sternotomy wires. Moderate thoracic spondylosis. Review of  the MIP images confirms the above findings. CT ABDOMEN and PELVIS FINDINGS Hepatobiliary: Normal liver with no liver mass. Normal gallbladder with no radiopaque cholelithiasis. No biliary ductal dilatation. Pancreas: Normal, with no mass or duct dilation. Spleen: Normal size. No mass. Adrenals/Urinary Tract: Normal right adrenal. Left adrenal 2.0 cm nodule with density 15 HU, unchanged using similar measurement technique in the short interval. No hydronephrosis. Scattered subcentimeter hypodense renal cortical lesions in both kidneys  are too small to characterize and are unchanged. Hypodense exophytic 1.3 cm renal cortical lesion in the posterior lower left kidney (series 505/image 44), stable. Normal caliber ureters. Decompressed urinary bladder with chronic diffuse bladder wall thickening. Nonspecific gas within nondependent bladder lumen is decreased. Foley catheter balloon inflated within the prostatic urethra with Foley tip at the vesicourethral junction. Stomach/Bowel: Normal non-distended stomach. Left-sided percutaneous pigtail drain terminates in the anterior midline lower peritoneal cavity with nearly resolved fluid collection in this location, now measuring 2.6 x 0.7 cm (series 505/image 66), previously 5.3 x 2.6 cm. No residual gas within this collection. Tip of drain remains in close proximity to the dome of the bladder. Fat stranding surrounding this collection has decreased. Normal caliber small bowel loops with no small bowel wall thickening. Appendix not discretely visualized. Scattered mild colonic diverticulosis with no large bowel wall thickening or acute pericolonic fat stranding. Vascular/Lymphatic: Atherosclerotic nonaneurysmal abdominal aorta. Patent portal, splenic, hepatic and renal veins. No pathologically enlarged lymph nodes in the abdomen or pelvis. Reproductive: Marked prostatomegaly. Other: No pneumoperitoneum. No ascites. No new focal fluid collections. Musculoskeletal: No aggressive appearing focal osseous lesions. Marked lumbar spondylosis. Review of the MIP images confirms the above findings. IMPRESSION: 1. Limited motion degraded scan with no evidence of acute pulmonary embolism. 2. Extensive patchy consolidation and ground-glass opacity in the left greater than right lungs compatible with multilobar pneumonia due to reported COVID-19. 3. Small dependent bilateral pleural effusions. 4. Dilated main pulmonary artery, suggesting pulmonary arterial hypertension. 5. Near complete resolution of anterior midline  lower peritoneal cavity fluid collection status post percutaneous drainage. Tip of percutaneous drain remains in close proximity to the dome of the bladder, with fistulization of this residual collection with the bladder lumen not excluded on the basis of this scan. Decreased gas within the nondependent bladder. 6. Foley catheter balloon is still dilated within the prostatic urethra with Foley tip at the vesicourethral junction. Recommend repositioning/replacement. Bladder is decompressed. No hydronephrosis. 7. Chronic mild diffuse bladder wall thickening is nonspecific and probably due to chronic bladder outlet obstruction by the markedly enlarged prostate. 8. Stable left adrenal nodule, probably an adenoma, for which follow-up adrenal protocol CT abdomen without and with IV contrast may be considered in 12 months. 9. Mild scattered colonic diverticulosis. 10.  Aortic Atherosclerosis (ICD10-I70.0). Electronically Signed   By: Ilona Sorrel M.D.   On: 01/14/2020 15:07        Scheduled Meds: . amiodarone  100 mg Oral Daily  . amLODipine  5 mg Oral Daily  . apixaban  2.5 mg Oral BID  . Chlorhexidine Gluconate Cloth  6 each Topical Daily  . ciprofloxacin  500 mg Oral BID  . feeding supplement (ENSURE ENLIVE)  237 mL Oral TID BM  . finasteride  5 mg Oral Daily  . gabapentin  100 mg Oral TID  . levETIRAcetam  250 mg Oral Daily  . levETIRAcetam  500 mg Oral QPM  . metoprolol succinate  25 mg Oral Daily  . multivitamin with minerals  1 tablet Oral Daily  .  pantoprazole  40 mg Oral Daily  . sodium chloride flush  5 mL Intracatheter Q8H  . tamsulosin  0.4 mg Oral Daily   Continuous Infusions: . sodium chloride Stopped (01/13/20 1350)  . lactated ringers 100 mL/hr at 01/13/20 2132  . vancomycin 2,000 mg (01/14/20 1005)     LOS: 7 days    Time spent: 45-50 minutes    Ezekiel Slocumb, DO [facility]   If 7PM-7AM, please contact night-coverage [email or web address] Password  [email or web address] 01/15/2020, 9:01 AM

## 2020-01-15 NOTE — Progress Notes (Signed)
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  I placed a 20 [place] two-way coud catheter on 01/06/2020, and follow-up imaging on 01/11/2020 for IR percutaneous drain in abscess cavity confirmed catheter was in the bladder.  I was contacted this morning by the overnight provider that there was reportedly some leakage around the catheter a few days ago and the Foley has been replaced multiple times by nursing staff. Urology had never been contacted. A CT was performed this morning that showed new Foley catheter was in the prostatic fossa.  The existing 14 [place] two-way catheter was deflated and removed. The patient was prepped and draped in standard sterile fashion and a 20 [place] two-way coud catheter was passed into the bladder with return of pink fluid. The catheter irrigated easily with saline. 10 cc were placed in the balloon and it was connected to dependent drainage with drainage of yellow urine.  Do not remove, reposition, or replace this catheter without discussing with urology. 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   Nickolas Madrid, MD 01/15/2020

## 2020-01-15 NOTE — Progress Notes (Signed)
ID Pt transferred to step down( ICU) for Afib with RVR Pt says he is feeling okay "They say it is my heart" No pain abdomen Foley replaced by Dr.Snisky because of malposition  Patient Vitals for the past 24 hrs:  BP Temp Temp src Pulse Resp SpO2  01/15/20 1800 (!) 105/59 - - (!) 129 (!) 31 93 %  01/15/20 1730 114/77 (!) 95.8 F (35.4 C) Axillary (!) 129 (!) 35 92 %  01/15/20 1700 92/64 - - (!) 130 - -  01/15/20 1645 96/61 - - (!) 130 - 100 %  01/15/20 1640 (!) 95/58 - - (!) 130 (!) 28 95 %  01/15/20 1637 (!) 95/59 - - (!) 130 (!) 28 99 %  01/15/20 1631 (!) 94/58 - - (!) 132 (!) 28 95 %  01/15/20 1626 107/60 - - (!) 134 (!) 36 95 %  01/15/20 1615 90/60 98 F (36.7 C) Oral (!) 128 (!) 36 -  01/15/20 1612 (!) 104/50 98 F (36.7 C) Oral (!) 118 - -  01/15/20 1556 98/77 - - (!) 124 - -  01/15/20 1554 103/65 98 F (36.7 C) - (!) 127 - -  01/15/20 1547 103/65 - - (!) 108 - -  01/15/20 1546 (!) 91/33 - - (!) 123 - -  01/15/20 1545 (!) 91/33 - - (!) 123 - -  01/15/20 1443 106/67 (!) 97.5 F (36.4 C) Oral 80 - 91 %  01/15/20 1248 135/73 98.5 F (36.9 C) Oral 87 (!) 26 90 %  01/15/20 0631 - - - 100 - 90 %  01/15/20 0628 (!) 156/66 98.5 F (36.9 C) Oral (!) 101 (!) 28 (!) 89 %    O/e awake and alert Chest b/l air entry- crepts bases HS- irregular Abd soft JP drain abdomen Foley draining bloody urine  CBC Latest Ref Rng & Units 01/15/2020 01/14/2020 01/13/2020  WBC 4.0 - 10.5 K/uL 32.7(H) 32.7(H) 24.4(H)  Hemoglobin 13.0 - 17.0 g/dL 11.0(L) 11.1(L) 10.7(L)  Hematocrit 39.0 - 52.0 % 32.7(L) 32.4(L) 31.3(L)  Platelets 150 - 400 K/uL 118(L) 118(L) 120(L)     CMP Latest Ref Rng & Units 01/15/2020 01/14/2020 01/13/2020  Glucose 70 - 99 mg/dL 107(H) 129(H) 92  BUN 8 - 23 mg/dL 14 11 8   Creatinine 0.61 - 1.24 mg/dL 0.69 0.55(L) 0.50(L)  Sodium 135 - 145 mmol/L 139 138 136  Potassium 3.5 - 5.1 mmol/L 4.0 3.3(L) 3.4(L)  Chloride 98 - 111 mmol/L 107 105 105  CO2 22 - 32 mmol/L 24 25 24    Calcium 8.9 - 10.3 mg/dL 7.9(L) 7.8(L) 7.5(L)  Total Protein 6.5 - 8.1 g/dL - - -  Total Bilirubin 0.3 - 1.2 mg/dL - - -  Alkaline Phos 38 - 126 U/L - - -  AST 15 - 41 U/L - - -  ALT 0 - 44 U/L - - -    UC- MRSA and Pseudomonas  Abdominal fluid culture- MRSa/pseudomonas    Impression/Recommendation  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 Was on  Vanco and zosyn- on 01/14/20 after discussion with family in preparation for discharge, and also to avoid the risk of AKI, I  switched zosyn to Po cipro . Pt has underlying Afib and on amiodarone  and there was a concern for prolonged QT and we planned to observe him closely- As today he has Rapid Afib and QT is borderline at 396- will dc cipro and change to cefepime   Afib- on amiodarone and eliquis  Severe prostate enlargement and urinary retention- has foley-   Discussed the management with the nurse

## 2020-01-16 LAB — COMPREHENSIVE METABOLIC PANEL
ALT: 19 U/L (ref 0–44)
AST: 18 U/L (ref 15–41)
Albumin: 1.9 g/dL — ABNORMAL LOW (ref 3.5–5.0)
Alkaline Phosphatase: 65 U/L (ref 38–126)
Anion gap: 11 (ref 5–15)
BUN: 21 mg/dL (ref 8–23)
CO2: 23 mmol/L (ref 22–32)
Calcium: 7.9 mg/dL — ABNORMAL LOW (ref 8.9–10.3)
Chloride: 105 mmol/L (ref 98–111)
Creatinine, Ser: 0.67 mg/dL (ref 0.61–1.24)
GFR calc Af Amer: >60 mL/min (ref >60)
GFR calc non Af Amer: >60 mL/min (ref >60)
Glucose, Bld: 144 mg/dL — ABNORMAL HIGH (ref 70–99)
Potassium: 3.8 mmol/L (ref 3.5–5.1)
Sodium: 139 mmol/L (ref 135–145)
Total Bilirubin: 0.9 mg/dL (ref 0.3–1.2)
Total Protein: 5.6 g/dL — ABNORMAL LOW (ref 6.5–8.1)

## 2020-01-16 LAB — CBC
HCT: 33.2 % — ABNORMAL LOW (ref 39.0–52.0)
Hemoglobin: 10.9 g/dL — ABNORMAL LOW (ref 13.0–17.0)
MCH: 30.5 pg (ref 26.0–34.0)
MCHC: 32.8 g/dL (ref 30.0–36.0)
MCV: 93 fL (ref 80.0–100.0)
Platelets: 122 10*3/uL — ABNORMAL LOW (ref 150–400)
RBC: 3.57 MIL/uL — ABNORMAL LOW (ref 4.22–5.81)
RDW: 14.3 % (ref 11.5–15.5)
WBC: 15 10*3/uL — ABNORMAL HIGH (ref 4.0–10.5)
nRBC: 0 % (ref 0.0–0.2)

## 2020-01-16 LAB — MAGNESIUM: Magnesium: 2.2 mg/dL (ref 1.7–2.4)

## 2020-01-16 MED ORDER — AMIODARONE HCL 200 MG PO TABS
100.0000 mg | ORAL_TABLET | Freq: Every day | ORAL | Status: DC
  Administered 2020-01-16 – 2020-01-20 (×5): 100 mg via ORAL
  Filled 2020-01-16 (×4): qty 1

## 2020-01-16 MED ORDER — MAGNESIUM SULFATE 2 GM/50ML IV SOLN
2.0000 g | Freq: Once | INTRAVENOUS | Status: AC
  Administered 2020-01-16: 2 g via INTRAVENOUS
  Filled 2020-01-16: qty 50

## 2020-01-16 MED ORDER — DILTIAZEM HCL-DEXTROSE 125-5 MG/125ML-% IV SOLN (PREMIX)
5.0000 mg/h | INTRAVENOUS | Status: DC
  Administered 2020-01-16 – 2020-01-17 (×2): 5 mg/h via INTRAVENOUS
  Filled 2020-01-16 (×2): qty 125

## 2020-01-16 MED ORDER — FUROSEMIDE 10 MG/ML IJ SOLN
40.0000 mg | Freq: Two times a day (BID) | INTRAMUSCULAR | Status: DC
  Administered 2020-01-16 (×2): 40 mg via INTRAVENOUS
  Filled 2020-01-16 (×2): qty 4

## 2020-01-16 NOTE — Progress Notes (Signed)
IV team placed new IV 22 g Rt wrist.  He has had intermittent confusion during shift up to this point and removes Oxygen and sats decrease to 80s when he does this. Alert to person and place. Amiodorone gtt infusing/ See EMAR. HR remains 120-130s Afibb. BP stable.  Call light in reach. Continuing to monitor.

## 2020-01-16 NOTE — Consult Note (Signed)
[facility] Consultation Note  Patient ID: Jimmy Moore, MRN: [id number], DOB/AGE: Sep 01, 1929 86 y.o. Admit date: 01/26/2020   Date of Consult: 01/16/2020 Primary Physician: Venia Carbon, MD Primary Cardiologist: [facility]  Chief Complaint:  Chief Complaint  Patient presents with  . Abdominal Pain   Reason for Consult: Atrial flutter  HPI: 86 y.o. male with apparent previous atherosclerotic coronary atherosclerosis hypertension hyperlipidemia and paroxysmal nonvalvular atrial fibrillation in the past on appropriate medication management and stable when the patient has had a significant Covid pneumonia and abdominal abscess with complication and infection.  The patient has had significant hypoxia and occasional hypotension from above issues for which medical management has been difficult.  The patient had new onset of atrial flutter with rapid ventricular rate and heart rate of 130bpm for which the patient has been able to tolerate hemodynamically.  Previous history of echocardiogram in December showed normal LV systolic function with ejection fraction of 55% and no evidence of significant valvular heart disease.  Additionally patient has had no evidence of congestive heart failure or myocardial infarction during significant stressors listed above.  Therefore atrial flutter is likely secondary to continued hypoxia and stressors but patient likely able to tolerate relatively well for a short period of time.  Amiodarone drip was started although atrial flutter does not respond well to amiodarone as well as heart rate control with Toprol and/or diltiazem.  Patient has remained on anticoagulation without evidence of significant side effects or bleeding complications.  Patient appears to be hemodynamically stable at this time with continued a heart rate being rapid  Past Medical History:  Diagnosis Date  . Atherosclerotic heart disease of native coronary artery without angina pectoris   .  Benign prostatic hyperplasia without lower urinary tract symptoms   . Chronic diastolic (congestive) heart failure ([place])   . Hyperlipidemia   . Hypertensive heart disease with heart failure ([place])   . Peripheral vascular disease ([place])   . Unspecified atrial fibrillation ([place])   . Unspecified dementia without behavioral disturbance [facility])       Surgical History: History reviewed. No pertinent surgical history.   Home Meds: Prior to Admission medications   Medication Sig Start Date End Date Taking? Authorizing Provider  acetaminophen (TYLENOL) 325 MG tablet Take 650 mg by mouth every 4 (four) hours as needed for mild pain or fever.   Yes [provider]  albuterol (VENTOLIN HFA) 108 (90 Base) MCG/ACT inhaler Inhale 1 puff into the lungs every 12 (twelve) hours as needed for wheezing or shortness of breath.   Yes [provider]  amiodarone (PACERONE) 100 MG tablet Take 100 mg by mouth daily.   Yes [provider]  amLODipine (NORVASC) 5 MG tablet Take 5 mg by mouth daily.   Yes [provider]  apixaban (ELIQUIS) 2.5 MG TABS tablet Take 2.5 mg by mouth 2 (two) times daily.   Yes [provider]  dextromethorphan-guaiFENesin (TUSSIN DM) 10-100 MG/5ML liquid Take 10 mLs by mouth every 4 (four) hours as needed for cough.   Yes [provider]  feeding supplement, ENSURE ENLIVE, (ENSURE ENLIVE) LIQD Take 237 mLs by mouth 3 (three) times daily between meals. 01/03/20  Yes Danford, Suann Larry, MD  finasteride (PROSCAR) 5 MG tablet Take 1 tablet (5 mg total) by mouth daily. 01/03/20  Yes Danford, Suann Larry, MD  gabapentin (NEURONTIN) 100 MG capsule Take 100 mg by mouth 3 (three) times daily.   Yes [provider]  levETIRAcetam (KEPPRA)  250 MG tablet Take 500 mg by mouth every evening.   Yes [provider]  levETIRAcetam (KEPPRA) 250 MG tablet Take 250 mg by mouth daily.   Yes [provider]  losartan (COZAAR) 100 MG  tablet Take 100 mg by mouth daily.   Yes [provider]  metoprolol succinate (TOPROL-XL) 25 MG 24 hr tablet Take 25 mg by mouth daily.   Yes [provider]  omeprazole (PRILOSEC) 20 MG capsule Take 20 mg by mouth at bedtime.   Yes [provider]  polyethylene glycol (MIRALAX / GLYCOLAX) 17 g packet Take 17 g by mouth every other day.   Yes [provider]  tamsulosin (FLOMAX) 0.4 MG CAPS capsule Take 0.4 mg by mouth daily.   Yes [provider]  traMADol (ULTRAM) 50 MG tablet Take 1 tablet (50 mg total) by mouth every 6 (six) hours as needed for moderate pain. 01/03/20  Yes Danford, Suann Larry, MD    Inpatient Medications:  . amLODipine  5 mg Oral Daily  . apixaban  2.5 mg Oral BID  . Chlorhexidine Gluconate Cloth  6 each Topical Daily  . dexamethasone (DECADRON) injection  6 mg Intravenous Daily  . feeding supplement (ENSURE ENLIVE)  237 mL Oral TID BM  . finasteride  5 mg Oral Daily  . furosemide  40 mg Intravenous Q12H  . gabapentin  100 mg Oral TID  . levETIRAcetam  250 mg Oral Daily  . levETIRAcetam  500 mg Oral QPM  . metoprolol succinate  25 mg Oral Daily  . multivitamin with minerals  1 tablet Oral Daily  . pantoprazole  40 mg Oral Daily  . sodium chloride flush  5 mL Intracatheter Q8H  . tamsulosin  0.4 mg Oral Daily   . sodium chloride 500 mL (01/15/20 0914)  . amiodarone 30 mg/hr (01/16/20 0347)  . ceFEPime (MAXIPIME) IV    . magnesium sulfate bolus IVPB    . vancomycin 2,000 mg (01/15/20 0915)    Allergies: No Known Allergies  Social History   Socioeconomic History  . Marital status: Married    Spouse name: Not on file  . Number of children: Not on file  . Years of education: Not on file  . Highest education level: Not on file  Occupational History  . Not on file  Tobacco Use  . Smoking status: Former Research scientist (life sciences)  . Smokeless tobacco: Never Used  Substance and Sexual Activity  . Alcohol use: Not Currently  .  Drug use: Never  . Sexual activity: Not on file  Other Topics Concern  . Not on file  Social History Narrative  . Not on file   Social Determinants of Health   Financial Resource Strain:   . Difficulty of Paying Living Expenses: Not on file  Food Insecurity:   . Worried About Charity fundraiser in the Last Year: Not on file  . Ran Out of Food in the Last Year: Not on file  Transportation Needs:   . Lack of Transportation (Medical): Not on file  . Lack of Transportation (Non-Medical): Not on file  Physical Activity:   . Days of Exercise per Week: Not on file  . Minutes of Exercise per Session: Not on file  Stress:   . Feeling of Stress : Not on file  Social Connections:   . Frequency of Communication with Friends and Family: Not on file  . Frequency of Social Gatherings with Friends and Family: Not on file  .  Attends Religious Services: Not on file  . Active Member of Clubs or Organizations: Not on file  . Attends Archivist Meetings: Not on file  . Marital Status: Not on file  Intimate Partner Violence:   . Fear of Current or Ex-Partner: Not on file  . Emotionally Abused: Not on file  . Physically Abused: Not on file  . Sexually Abused: Not on file     History reviewed. No pertinent family history.   Review of Systems Cannot assess due to obtundation  Labs: No results for input(s): CKTOTAL, CKMB, TROPONINI in the last 72 hours. Lab Results  Component Value Date   WBC 15.0 (H) 01/16/2020   HGB 10.9 (L) 01/16/2020   HCT 33.2 (L) 01/16/2020   MCV 93.0 01/16/2020   PLT 122 (L) 01/16/2020    Recent Labs  Lab 01/16/20 0647  NA 139  K 3.8  CL 105  CO2 23  BUN 21  CREATININE 0.67  CALCIUM 7.9*  PROT 5.6*  BILITOT 0.9  ALKPHOS 65  ALT 19  AST 18  GLUCOSE 144*   No results found for: CHOL, HDL, LDLCALC, TRIG Lab Results  Component Value Date   DDIMER 3.59 (H) 01/03/2020    Radiology/Studies:  CT ABDOMEN PELVIS WO CONTRAST  Result Date:  01/15/2020 CLINICAL DATA:  Urinary catheter displacement, unable to determine output EXAM: CT ABDOMEN AND PELVIS WITHOUT CONTRAST TECHNIQUE: Multidetector CT imaging of the abdomen and pelvis was performed following the standard protocol without IV contrast. COMPARISON:  January 14, 2020 FINDINGS: Lower chest: The visualized heart size within normal limits. No pericardial fluid/thickening. No hiatal hernia. Coronary artery calcifications are seen. Small bilateral pleural effusions are noted. Patchy ground-glass opacities are seen at both lung bases. Hepatobiliary: Although limited due to the lack of intravenous contrast, normal in appearance without gross focal abnormality. No evidence of calcified gallstones or biliary ductal dilatation. Pancreas:  Unremarkable.  No surrounding inflammatory changes. Spleen: Normal in size. Although limited due to the lack of intravenous contrast, normal in appearance. Adrenals/Urinary Tract: Both adrenal glands appear normal. Bilateral renal atrophy is seen. No hydronephrosis. No renal or collecting system calculi. Foley catheter balloon is seen again inflated within the prostatic urethra. The tip appears to be just projecting into the partially decompressed bladder. There is a small amount of air seen at the vesicoureteral junction. There is contrast seen within the partially decompressed bladder with diffuse wall thickening and calcifications. Small foci of air seen within the bladder dome with a fistulous tract extending to the left anterior abdominal wall collection. Stomach/Bowel: The stomach, small bowel, and colon are normal in appearance. No inflammatory changes or obstructive findings. Scattered colonic diverticula are noted. There is a moderate to large amount of colonic stool. Vascular/Lymphatic: There are no enlarged abdominal or pelvic lymph nodes. Scattered aortic atherosclerotic calcifications are seen without aneurysmal dilatation. Reproductive: Enlarged  heterogeneous prostate gland. Other: A left lower anterior abdominal wall pigtail catheter is seen. There is a residual small collection measuring 2.9 cm which now appears to have a small amount of contrast seen within it. There are small foci of air at the dome of the bladder with a possible fistulous tract between the collection and the dome of the bladder, series 2, image 78. Musculoskeletal: No acute or significant osseous findings. IMPRESSION: 1. Small bilateral pleural effusions with adjacent patchy airspace consolidation, consistent with COVID pneumonia. 2. Foley catheter balloon dilated within the prosthetic urethra as on prior exam. 3. Contrast within a  chronically thickened partially decompressed bladder. 4. Left lower anterior abdominal pigtail catheter within a 2.9 cm collection that now contains contrast, there appears to be a small fistulous tract extending to the bladder dome where there are small foci of air as described above. 5. Diverticulosis without diverticulitis. 6.  Aortic Atherosclerosis (ICD10-I70.0). Electronically Signed   By: Prudencio Pair M.D.   On: 01/15/2020 05:14   DG Chest 1 View  Result Date: 12/30/2019 CLINICAL DATA:  Dyspnea EXAM: CHEST  1 VIEW COMPARISON:  07/21/2013 FINDINGS: Cardiac shadow is stable. Postsurgical changes are again noted. Lungs are well aerated bilaterally. Chronic blunting of the left costophrenic angle is noted. Some patchy airspace opacity is noted in the right mid lung laterally which may represent some early infiltrate. No other focal abnormality is noted. IMPRESSION: Patchy airspace opacity in the right mid lung. Electronically Signed   By: Inez Catalina M.D.   On: 12/30/2019 12:57   CT ANGIO CHEST PE W OR WO CONTRAST  Result Date: 01/14/2020 CLINICAL DATA:  COVID-19 positive. Tachypnea. Hypoxia. Inpatient. Lower abdominal abscess status post percutaneous drainage on 01/12/2020. EXAM: CT ANGIOGRAPHY CHEST CT ABDOMEN AND PELVIS WITH CONTRAST  TECHNIQUE: Multidetector CT imaging of the chest was performed using the standard protocol during bolus administration of intravenous contrast. Multiplanar CT image reconstructions and MIPs were obtained to evaluate the vascular anatomy. Multidetector CT imaging of the abdomen and pelvis was performed using the standard protocol during bolus administration of intravenous contrast. CONTRAST:  14mL OMNIPAQUE IOHEXOL 350 MG/ML SOLN COMPARISON:  01/14/2020 CT abdomen/pelvis. 12/30/2019 chest radiograph. FINDINGS: CTA CHEST FINDINGS Cardiovascular: The study is low-to-moderate quality for the evaluation of pulmonary embolism, with substantial motion degradation. There are no convincing filling defects in the central, lobar, segmental or subsegmental pulmonary artery branches to suggest acute pulmonary embolism. Atherosclerotic nonaneurysmal thoracic aorta. Dilated main pulmonary artery (3.6 cm diameter). Top-normal heart size. No significant pericardial fluid/thickening. Left anterior descending and right coronary atherosclerosis. Mediastinum/Nodes: Hypodense bilateral thyroid nodules, largest 2.4 cm on the left. Unremarkable esophagus. No pathologically enlarged axillary, mediastinal or hilar lymph nodes. Lungs/Pleura: No pneumothorax. Small dependent bilateral pleural effusions. Extensive patchy consolidation and ground-glass opacity in the left greater than right lungs involving all lung lobes. No lung masses or discrete pulmonary nodules on these motion degraded images. Musculoskeletal: No aggressive appearing focal osseous lesions. Intact sternotomy wires. Moderate thoracic spondylosis. Review of the MIP images confirms the above findings. CT ABDOMEN and PELVIS FINDINGS Hepatobiliary: Normal liver with no liver mass. Normal gallbladder with no radiopaque cholelithiasis. No biliary ductal dilatation. Pancreas: Normal, with no mass or duct dilation. Spleen: Normal size. No mass. Adrenals/Urinary Tract: Normal right  adrenal. Left adrenal 2.0 cm nodule with density 15 HU, unchanged using similar measurement technique in the short interval. No hydronephrosis. Scattered subcentimeter hypodense renal cortical lesions in both kidneys are too small to characterize and are unchanged. Hypodense exophytic 1.3 cm renal cortical lesion in the posterior lower left kidney (series 505/image 44), stable. Normal caliber ureters. Decompressed urinary bladder with chronic diffuse bladder wall thickening. Nonspecific gas within nondependent bladder lumen is decreased. Foley catheter balloon inflated within the prostatic urethra with Foley tip at the vesicourethral junction. Stomach/Bowel: Normal non-distended stomach. Left-sided percutaneous pigtail drain terminates in the anterior midline lower peritoneal cavity with nearly resolved fluid collection in this location, now measuring 2.6 x 0.7 cm (series 505/image 66), previously 5.3 x 2.6 cm. No residual gas within this collection. Tip of drain remains in close proximity to the  dome of the bladder. Fat stranding surrounding this collection has decreased. Normal caliber small bowel loops with no small bowel wall thickening. Appendix not discretely visualized. Scattered mild colonic diverticulosis with no large bowel wall thickening or acute pericolonic fat stranding. Vascular/Lymphatic: Atherosclerotic nonaneurysmal abdominal aorta. Patent portal, splenic, hepatic and renal veins. No pathologically enlarged lymph nodes in the abdomen or pelvis. Reproductive: Marked prostatomegaly. Other: No pneumoperitoneum. No ascites. No new focal fluid collections. Musculoskeletal: No aggressive appearing focal osseous lesions. Marked lumbar spondylosis. Review of the MIP images confirms the above findings. IMPRESSION: 1. Limited motion degraded scan with no evidence of acute pulmonary embolism. 2. Extensive patchy consolidation and ground-glass opacity in the left greater than right lungs compatible with  multilobar pneumonia due to reported COVID-19. 3. Small dependent bilateral pleural effusions. 4. Dilated main pulmonary artery, suggesting pulmonary arterial hypertension. 5. Near complete resolution of anterior midline lower peritoneal cavity fluid collection status post percutaneous drainage. Tip of percutaneous drain remains in close proximity to the dome of the bladder, with fistulization of this residual collection with the bladder lumen not excluded on the basis of this scan. Decreased gas within the nondependent bladder. 6. Foley catheter balloon is still dilated within the prostatic urethra with Foley tip at the vesicourethral junction. Recommend repositioning/replacement. Bladder is decompressed. No hydronephrosis. 7. Chronic mild diffuse bladder wall thickening is nonspecific and probably due to chronic bladder outlet obstruction by the markedly enlarged prostate. 8. Stable left adrenal nodule, probably an adenoma, for which follow-up adrenal protocol CT abdomen without and with IV contrast may be considered in 12 months. 9. Mild scattered colonic diverticulosis. 10.  Aortic Atherosclerosis (ICD10-I70.0). Electronically Signed   By: Ilona Sorrel M.D.   On: 01/14/2020 15:07   CT ABDOMEN PELVIS W CONTRAST  Result Date: 01/14/2020 CLINICAL DATA:  COVID-19 positive. Tachypnea. Hypoxia. Inpatient. Lower abdominal abscess status post percutaneous drainage on 01/03/2020. EXAM: CT ANGIOGRAPHY CHEST CT ABDOMEN AND PELVIS WITH CONTRAST TECHNIQUE: Multidetector CT imaging of the chest was performed using the standard protocol during bolus administration of intravenous contrast. Multiplanar CT image reconstructions and MIPs were obtained to evaluate the vascular anatomy. Multidetector CT imaging of the abdomen and pelvis was performed using the standard protocol during bolus administration of intravenous contrast. CONTRAST:  128mL OMNIPAQUE IOHEXOL 350 MG/ML SOLN COMPARISON:  01/15/2020 CT abdomen/pelvis.  12/30/2019 chest radiograph. FINDINGS: CTA CHEST FINDINGS Cardiovascular: The study is low-to-moderate quality for the evaluation of pulmonary embolism, with substantial motion degradation. There are no convincing filling defects in the central, lobar, segmental or subsegmental pulmonary artery branches to suggest acute pulmonary embolism. Atherosclerotic nonaneurysmal thoracic aorta. Dilated main pulmonary artery (3.6 cm diameter). Top-normal heart size. No significant pericardial fluid/thickening. Left anterior descending and right coronary atherosclerosis. Mediastinum/Nodes: Hypodense bilateral thyroid nodules, largest 2.4 cm on the left. Unremarkable esophagus. No pathologically enlarged axillary, mediastinal or hilar lymph nodes. Lungs/Pleura: No pneumothorax. Small dependent bilateral pleural effusions. Extensive patchy consolidation and ground-glass opacity in the left greater than right lungs involving all lung lobes. No lung masses or discrete pulmonary nodules on these motion degraded images. Musculoskeletal: No aggressive appearing focal osseous lesions. Intact sternotomy wires. Moderate thoracic spondylosis. Review of the MIP images confirms the above findings. CT ABDOMEN and PELVIS FINDINGS Hepatobiliary: Normal liver with no liver mass. Normal gallbladder with no radiopaque cholelithiasis. No biliary ductal dilatation. Pancreas: Normal, with no mass or duct dilation. Spleen: Normal size. No mass. Adrenals/Urinary Tract: Normal right adrenal. Left adrenal 2.0 cm nodule with density 15 HU,  unchanged using similar measurement technique in the short interval. No hydronephrosis. Scattered subcentimeter hypodense renal cortical lesions in both kidneys are too small to characterize and are unchanged. Hypodense exophytic 1.3 cm renal cortical lesion in the posterior lower left kidney (series 505/image 44), stable. Normal caliber ureters. Decompressed urinary bladder with chronic diffuse bladder wall  thickening. Nonspecific gas within nondependent bladder lumen is decreased. Foley catheter balloon inflated within the prostatic urethra with Foley tip at the vesicourethral junction. Stomach/Bowel: Normal non-distended stomach. Left-sided percutaneous pigtail drain terminates in the anterior midline lower peritoneal cavity with nearly resolved fluid collection in this location, now measuring 2.6 x 0.7 cm (series 505/image 66), previously 5.3 x 2.6 cm. No residual gas within this collection. Tip of drain remains in close proximity to the dome of the bladder. Fat stranding surrounding this collection has decreased. Normal caliber small bowel loops with no small bowel wall thickening. Appendix not discretely visualized. Scattered mild colonic diverticulosis with no large bowel wall thickening or acute pericolonic fat stranding. Vascular/Lymphatic: Atherosclerotic nonaneurysmal abdominal aorta. Patent portal, splenic, hepatic and renal veins. No pathologically enlarged lymph nodes in the abdomen or pelvis. Reproductive: Marked prostatomegaly. Other: No pneumoperitoneum. No ascites. No new focal fluid collections. Musculoskeletal: No aggressive appearing focal osseous lesions. Marked lumbar spondylosis. Review of the MIP images confirms the above findings. IMPRESSION: 1. Limited motion degraded scan with no evidence of acute pulmonary embolism. 2. Extensive patchy consolidation and ground-glass opacity in the left greater than right lungs compatible with multilobar pneumonia due to reported COVID-19. 3. Small dependent bilateral pleural effusions. 4. Dilated main pulmonary artery, suggesting pulmonary arterial hypertension. 5. Near complete resolution of anterior midline lower peritoneal cavity fluid collection status post percutaneous drainage. Tip of percutaneous drain remains in close proximity to the dome of the bladder, with fistulization of this residual collection with the bladder lumen not excluded on the basis  of this scan. Decreased gas within the nondependent bladder. 6. Foley catheter balloon is still dilated within the prostatic urethra with Foley tip at the vesicourethral junction. Recommend repositioning/replacement. Bladder is decompressed. No hydronephrosis. 7. Chronic mild diffuse bladder wall thickening is nonspecific and probably due to chronic bladder outlet obstruction by the markedly enlarged prostate. 8. Stable left adrenal nodule, probably an adenoma, for which follow-up adrenal protocol CT abdomen without and with IV contrast may be considered in 12 months. 9. Mild scattered colonic diverticulosis. 10.  Aortic Atherosclerosis (ICD10-I70.0). Electronically Signed   By: Ilona Sorrel M.D.   On: 01/14/2020 15:07   CT ABDOMEN PELVIS W CONTRAST  Result Date: 01/28/2020 CLINICAL DATA:  Unspecified abdominal pain, COVID-19 positive EXAM: CT ABDOMEN AND PELVIS WITH CONTRAST TECHNIQUE: Multidetector CT imaging of the abdomen and pelvis was performed using the standard protocol following bolus administration of intravenous contrast. CONTRAST:  157mL OMNIPAQUE IOHEXOL 300 MG/ML  SOLN COMPARISON:  None. FINDINGS: Lower chest: Mixed ground-glass, consolidative and reticular opacities are present in the lower lobes, right middle lobe and lingula compatible with atypical infection in the setting of COVID-19 positivity. Mild airways thickening is present as well. Hepatobiliary: No focal liver abnormality is seen. No gallstones, gallbladder wall thickening, or biliary dilatation. Pancreas: Fatty replacement of the pancreas. No pancreatic ductal dilatation or surrounding inflammatory changes. Spleen: Normal in size without focal abnormality. 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 No worrisome adrenal lesions. Mild bilateral symmetric perinephric stranding, a nonspecific finding though  may correlate with either  age or decreased renal function. Kidneys enhance and excrete symmetrically. Some mild left urothelial thickening and periureteral stranding is present. The urinary bladder is circumferentially thickened with multiple foci intraluminal gas. Several punctate foci of gas are seen within the wall of the bladder. These could feasibly reflect gas within small bladder diverticula or crenulation given an enlarged prostate and likely sequela of chronic outlet obstruction however emphysematous cystitis is not fully excluded. Furthermore, there is focal discontinuity along the dome of the bladder contiguous with a an irregular air and fluid containing collection in the midline abdomen closely apposed to several loops of thickened and enhancing small bowel. An enterovesicular fistula is suspected. Stomach/Bowel: Distal esophagus, stomach and duodenal sweep are unremarkable. There are few focally thickened segments of small bowel with irregular mural enhancement and edematous change in the low anterior mid abdomen centered upon a rim enhancing 5.2 x 2.6 x 7.7cm air and fluid containing collection worrisome for developing abscess or potential enterovesicular fistula given direct continuity the dome of the bladder as detailed above. More distal small bowel has a normal appearance. Appendix is not well visualized. No focal pericecal inflammation is seen. Pancolonic diverticulosis without evidence of acute diverticulitis. Vascular/Lymphatic: Extensive atherosclerotic calcification of the abdominal aorta, iliac arteries and branch vessels. No aneurysm or ectasia is seen. Reactive adenopathy in the low mesentery with some geographic mesenteric stranding involving the affected bowel loops detailed above. Reproductive: The prostate is markedly enlarged. A Foley catheter appears inflated in the low prosthetic urethra. Some focal hypoattenuating cystic foci within the prostate could reflect possible abscess given the adjacent inflammation  and stranding. Other: Phlegmonous change in the mid mesentery with the air and fluid containing collection are arising from the dome of the bladder and or bowel loops. No free intraperitoneal air is seen. No bowel containing hernias. Musculoskeletal: Atrophy of the anterior abdominal wall musculature Multilevel degenerative changes are present in the imaged portions of the spine. No acute osseous abnormality or suspicious osseous lesion. Prior sternotomy changes are noted. Additional degenerative changes noted in the hips and SI joints bilaterally as well as the symphysis pubis. IMPRESSION: 1. 5.2 x 2.6 x 7.7cm rim enhancing air and fluid containing collection in the low anterior mid abdomen. Collection is directly contiguous with the inflamed bladder dome with several adjacent loops of edematous and thickened small bowel. Overall appearance is worrisome for an abscess with possible enterovesicular fistulization. 2. Bladder itself is circumferentially thickened with gas in the bladder wall. While these foci of air could be within granulations of the bladder given an enlarged prostate and likely some sequela of chronic outlet obstruction, given the patient's extreme white count features are highly suspicious for an emphysematous cystitis. 3. Inflated Foley catheter balloon is positioned within the prostatic urethra. Recommend repositioning or removal. 4. Rim enhancing low attenuation collection within the prostate parenchyma worrisome for potential abscess. 5. Mixed ground-glass, consolidative and reticular opacities in the lower lobes, right middle lobe and lingula compatible with atypical infection in the setting of COVID positivity positivity. 6. Colonic diverticulosis without evidence of diverticulitis. 7. 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 8.  Aortic Atherosclerosis (ICD10-I70.0). These results were called by telephone at the time of interpretation on 01/15/2020 at 4:46 am to  provider [facility] , who verbally acknowledged these results. Electronically Signed   By: Lovena Le M.D.   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 Please perform CT-guided percutaneous drainage catheter placement for infection source control purposes. EXAM: CT IMAGE GUIDED DRAINAGE BY PERCUTANEOUS CATHETER COMPARISON:  CT abdomen pelvis-01/27/2020 MEDICATIONS: The patient is currently admitted to the hospital and receiving intravenous antibiotics. The antibiotics were administered within an appropriate time frame prior to the initiation of the procedure. ANESTHESIA/SEDATION: Moderate (conscious) sedation was employed during this procedure. A total of Versed 0.5 mg and Fentanyl 50 mcg was administered intravenously. Moderate Sedation Time: 11 minutes. The patient's level of consciousness and vital signs were monitored continuously by radiology nursing throughout the procedure under my direct supervision. CONTRAST:  None COMPLICATIONS: None immediate. PROCEDURE: Informed written consent was obtained from the patient after a discussion of the risks, benefits and alternatives to treatment. The patient was placed supine on the CT gantry and a pre procedural CT was performed re-demonstrating the known abscess/fluid collection within the ventral aspect the lower abdomen with dominant mixed air in fluid containing component measuring approximately 5.0 x 1.9 cm (image 21, series 2). The procedure was planned. A timeout was performed prior to the initiation of the procedure. The skin overlying the ventral aspect of the lower abdomen/pelvis was prepped and draped in the usual sterile fashion. The overlying soft tissues were anesthetized with 1% lidocaine with epinephrine. Appropriate trajectory was planned with the use of a 22 gauge spinal needle. An 18 gauge trocar needle was  advanced into the abscess/fluid collection and a short Amplatz super stiff wire was coiled within the collection. Appropriate positioning was confirmed with a limited CT scan. The tract was serially dilated allowing placement of a 10 [place] all-purpose drainage catheter. Appropriate positioning was confirmed with a limited postprocedural CT scan. Approximately 10 ml of blood-tinged serous fluid was aspirated. The tube was connected to a JP bulb and sutured in place. A dressing was placed. The patient tolerated the procedure well without immediate post procedural complication. IMPRESSION: Successful CT guided placement of a 10 French all purpose drain catheter into the midline of the lower abdomen/pelvis with aspiration of 10 mL of blood-tinged serous fluid. Samples were sent to the laboratory as requested by the ordering clinical team. Electronically Signed   By: Sandi Mariscal M.D.   On: 01/11/2020 08:24    EKG: Atrial flutter with rapid ventricular rate at 130 bpm and appears to be 2-1 response  Weights: Filed Weights   01/26/2020 0150 01/09/20 0009 01/13/20 1348  Weight: 87.9 kg 83.6 kg 90.1 kg     Physical Exam: Blood pressure 108/77, pulse (!) 114, temperature 98 F (36.7 C), temperature source Oral, resp. rate 20, height 6\' 4"  (1.93 m), weight 90.1 kg, SpO2 91 %. Body mass index is 24.18 kg/m. The rest of physical exam as per primary     Assessment: 86 year old male with paroxysmal nonvalvular atrial fibrillation now with Covid pneumonia hypoxia and abdominal abscess with infection and complication having acute onset of atrial flutter with rapid ventricular rate although tolerating well without evidence of congestive heart failure or myocardial infarction with normal LV function by echocardiogram  Plan: 1.  Okay to continue amiodarone intravenously for treatment of history of paroxysmal atrial fibrillation and possibly help atrial flutter heart rate to some degree 2.  Patient would best  benefit from intravenous diltiazem at 15 mg/h for better heart rate control at hopefully less than 110 bpm. 3.  Continue anticoagulation for further risk reduction in stroke with atrial fibrillation 4.  Continue supportive care of Covid pneumonia and abdominal abscess with sepsis without restriction 5.  No further cardiac diagnostics necessary at this time 6.  Further treatment options after initiation of diltiazem drip as needed  Signed, Corey Skains M.D. [facility] 01/16/2020, 8:51 AM

## 2020-01-16 NOTE — Progress Notes (Signed)
Pharmacy Antibiotic Note  Jimmy Moore is a 86 y.o. 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 Patient admitted from skilled nursing facility. Patient with recent findings of MRSA and Pseudomonas in urine on 1/3 and treatment for COVID-19 pneumonia from 12/30-1/3. Pharmacy was consulted for Vancomycin dosing. This is day #8 of IV antibiotics, renal function has been stable since the previous note, leukocytosis unresolved  Vancomycin Levels: Peak 01/12 1339: 38.7 mcg/mL Trough 01/13 0903: 12 mcg/mL  Plan: continue vancomycin 2g IV Q24hr  obtain serum creatinine daily to monitor renal function  obtain peaks and troughs as clinically inidicated   Height: 6\' 4"  (193 cm) Weight: 198 lb 10.2 oz (90.1 kg) IBW/kg (Calculated) : 86.8  Temp (24hrs), Avg:97.7 F (36.5 C), Min:95.8 F (35.4 C), Max:98.4 F (36.9 C)  Recent Labs  Lab 01/12/20 0505 01/12/20 1339 01/13/20 0517 01/13/20 0903 01/14/20 0450 01/15/20 0544 01/16/20 0647  WBC 21.9*  --  24.4*  --  32.7* 32.7* 15.0*  CREATININE 0.50*  --  0.50*  --  0.55* 0.69 0.67  VANCOTROUGH  --   --   --  12*  --   --   --   VANCOPEAK  --  37  --   --   --   --   --     Estimated Creatinine Clearance: 78.4 mL/min (by C-G formula based on SCr of 0.67 mg/dL).    No Known Allergies  Antimicrobials this admission: Metronidazole 1/8 x 1 Zosyn 1/8 >> 1/14 Ciprofloxacin 1/14 >> 1/15 Cefepime 1/16 >> Vancomycin 1/8 >>  Microbiology results: 1/8 BCx: no growth final 1/8 UCx: P aeruginosa 1/8 WCx P aeruginosa, MRSA   Thank you for allowing pharmacy to be a part of this patient's care.  Vi Biddinger L 01/16/2020 1:36 PM

## 2020-01-16 NOTE — Progress Notes (Signed)
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 Patient is now growing out Pseudomonas from his urine as well as from the abscess area,sensitive to Zosyn.Patient is also COVID-19 positive with acute hypoxia requiring 4 L/min and has associated tachypnea. He was previously treated for this during prior admission, discharged on 01/01/20. CTA chest obtained 1/14 negative for PE.  Increased oxygen requirements and tachypnea 1/15, in addition to A-flutter with RVR requiring amiodarone drip for rate control.  Subjective 1/16: Patient seen this morning in stepdown.  He is more awake, alert and more talkative today.  No acute events reported overnight.  Patient states he feels pretty good.  Denies fevers or chills, abdominal pain, nausea vomiting or diarrhea, chest pain or shortness of breath.  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 --IR recommended keeping the drain at least until 01/16/2020,if not longer. --IR doesbelieve there is some communication between the bladder and they  think it needs to be there longer so that can heal up. No further imagingneededunless his abdominal exam worsens. --antibiotics per infectious disease --Continue vancomycin and cefepime --DO NOT replace or reposition Foley without contact urology.  Acute Hypoxic Respiratory Failure with hypoxia - in setting of recent Covid PNA Still requiring to 15 L/min HFNC today, this is up from 4 L on 1/14.   CTA chest 1/14 was negative for PE but showed extensive patchy consolidation and ground-glass opacity in the left greater than right lungs involving all lung lobes, enlarged main pulmonary artery.  Patient also with a-flutter RVR. --start IV dexamethasone in case this is post-covid issue --Lasix 40 mg IV twice daily as patient has positive fluid balance --supplement O2 for sat > 90% --wean down O2 as tolerated --HR control as below  Atrial Flutter with RVR - new onset 1/15.  BP too soft for beta/calcium channel blockers initially.  Not controlled with amio bolus alone.  Transferred to stepdown for amiodarone gtt.  Likely due to hypoxia.    Blood pressures better this morning. --cardiology consulted, Dr. Nehemiah Moore  --Start Cardizem drip as BP tolerates --Can stop amio gtt. once rate controlled  --maintain HR < 110  --maintain K>4, Mg>2  Chronic A-Fib --continue Eliquis --PO amio on hold while on gtt --continuehome Toprol   History of COVID-19pneumonia --Diagnosed with Covid on 12/30/2019 --s/p remdesivir and steroids discharged 01/01/2020 --persistentgroundglass opacities in bilateral lower lobes and right middle lobe on CT most likelypost-Covid inflammation. --patient without respiratory symptomsinitially, but now with tachypnea and 15 L/min oxygen requirement. Plan as above.   Marked leukocytosis- improving with antibiotics, but increased again today Improved after percutaneous drainage 56-->36--26--22--22--24-->32   Coronary disease, secondary  prevention Hypertension Chronic diastolic CHF, potentially with decompensation contributing to degree of hypoxia --  Euvolemic, blood pressure normal --Continueamlodipine, metoprolol --Hold losartan for now, add back if blood pressure warrants. --Patient does not appear to be on antiplatelet agent at home. --Daily weights, strict ins and outs --1500 cc fluid restriction  BPH Patient had Foley placed by urology, will need to be in for 3 weeks with outpatient follow-up with urology. --Continuetamsulosin and finasteride  GERD Continuepantoprazole  Seizure disorder Continues Keppra  Dementia without behavioral disturbance Monitor for behavioral disturbance  High risk of malnutrition He has moderate muscle mass and fat loss.  Longer-term plans Per Jimmy Moore of 01/12/20: "Conversation1/11/21with daughter Jimmy Moore and her husband Jimmy Moore who is a Software engineer about long-term plans for care for Mr. Jimmy Moore. I noted it could be very difficult to eradicate abscess without surgery and long-term outcomelooks challenging. She states she will discuss the situation with her brother and sisters but at this point to continue with imaging and treatment as we are doing. She rightbring up the possibility of palliative care with them. On rediscussion1/11/2020 with son-in-lawBuddywire I updated him about marked improvement in abdominal exam as well as recommendations of radiology to continue present course and no further recommendations for imaging. He is concerned about patient's nutritional status and not wanting to eat which I concur with. Will place dietary consult for possible supplements. Patient is DNR"   DVT prophylaxis:on Eliquis Code Status: DNR Family Communication:Daughter and son-in-law updated by phone this morning, all questions answered  Disposition Plan:Pending clinical improvement and PT evaluation. Urology,general surgeryand IDfollowing. Patient  requires continued hospital level caregiven need forIV antibiotics for intraabdominal abscess with possible fistula with drain in place, in addition to  persistent acute hypoxic respiratory failure   Consultants:  Urology  General surgery  Procedures:Placement of abdominal abscess drain percutaneously by IR  Antimicrobials:  Vancomycin - 1/8 to TBD  Zosyn - 1/8-1/14   Cipro - 1/14-1/15  Cefepime - 1/15 to TBD   Objective: Vitals:   01/16/20 0200 01/16/20 0400 01/16/20 0530 01/16/20 0600  BP: 116/75 107/63 107/74 108/77  Pulse: (!) 125 66 (!) 125 (!) 114  Resp: 20 (!) 22 (!) 26 20  Temp:      TempSrc:      SpO2: 95% 94% 94% 91%  Weight:      Height:        Intake/Output Summary (Last 24 hours) at 01/16/2020 0728 Last data filed at 01/16/2020 0600 Gross per 24 hour  Intake 445.62 ml  Output 1190 ml  Net -744.38 ml   Filed Weights   01/20/2020 0150 01/09/20 0009 01/13/20 1348  Weight: 87.9 kg 83.6 kg 90.1 kg    Examination:  General exam: awake, alert, no acute distress, obese HEENT: Dry mucus membranes, hearing grossly normal  Respiratory system: Bilateral rhonchi, no wheezes, normal respiratory effort. Cardiovascular system: normal S1/S2, RRR, no JVD, murmurs, rubs, gallops, no pedal edema.   Gastrointestinal system: soft, non-tender, non-distended abdomen, JP drain in place with small amount of serosanguineous fluid  Central nervous system: alert and oriented x3. no gross focal neurologic deficits, normal speech Extremities: moves all, no cyanosis, normal tone Skin: dry, intact, normal temperature Psychiatry: normal mood, congruent affect, judgement and insight appear normal    Data Reviewed: I have personally reviewed following labs and imaging studies  CBC: Recent Labs  Lab 01/11/20 0611 01/12/20 0505 01/13/20 0517 01/14/20 0450 01/15/20 0544  WBC 22.2* 21.9* 24.4* 32.7* 32.7*  NEUTROABS  --   --   --  24.4* 25.6*  HGB 11.2* 11.3* 10.7*  11.1*  11.0*  HCT 33.8* 33.6* 31.3* 32.4* 32.7*  MCV 91.4 91.1 91.3 90.8 92.1  PLT 142* 129* 120* 118* [id number]*   Basic Metabolic Panel: Recent Labs  Lab 01/11/20 0611 01/12/20 0505 01/13/20 0517 01/14/20 0450 01/15/20 0544  NA 134* 134* 136 138 139  K 3.5 3.2* 3.4* 3.3* 4.0  CL 105 104 105 105 107  CO2 20* 23 24 25 24   GLUCOSE 78 80 92 129* 107*  BUN 10 9 8 11 14   CREATININE 0.54* 0.50* 0.50* 0.55* 0.69  CALCIUM 7.3* 7.5* 7.5* 7.8* 7.9*  MG  --   --   --  1.6* 1.7   GFR: Estimated Creatinine Clearance: 78.4 mL/min (by C-G formula based on SCr of 0.69 mg/dL). Liver Function Tests: Recent Labs  Lab 01/11/20 0611 01/12/20 0505  AST 15 16  ALT 15 17  ALKPHOS 48 49  BILITOT 1.1 1.2  PROT 5.2* 5.1*  ALBUMIN 2.0* 1.9*   No results for input(s): LIPASE, AMYLASE in the last 168 hours. No results for input(s): AMMONIA in the last 168 hours. Coagulation Profile: No results for input(s): INR, PROTIME in the last 168 hours. Cardiac Enzymes: No results for input(s): CKTOTAL, CKMB, CKMBINDEX, TROPONINI in the last 168 hours. BNP (last 3 results) No results for input(s): PROBNP in the last 8760 hours. HbA1C: No results for input(s): HGBA1C in the last 72 hours. CBG: No results for input(s): GLUCAP in the last 168 hours. Lipid Profile: No results for input(s): CHOL, HDL, LDLCALC, TRIG, CHOLHDL, LDLDIRECT in the last 72 hours. Thyroid Function Tests: No results for input(s): TSH, T4TOTAL, FREET4, T3FREE, THYROIDAB in the last 72 hours. Anemia Panel: No results for input(s): VITAMINB12, FOLATE, FERRITIN, TIBC, IRON, RETICCTPCT in the last 72 hours. Sepsis Labs: Recent Labs  Lab 01/14/20 0450  PROCALCITON 0.31    Recent Results (from the past 240 hour(s))  Urine culture     Status: Abnormal   Collection Time: 01/10/2020  2:32 AM   Specimen: Urine, Random  Result Value Ref Range Status   Specimen Description   Final    URINE, RANDOM Performed at [facility], [address], [place], [place] [address]    Special Requests   Final    NONE Performed at [facility], [address] [place], [place] [address]    Culture >=100,000 COLONIES/mL PSEUDOMONAS AERUGINOSA (A)  Final   Report Status 01/10/2020 FINAL  Final   Organism ID, Bacteria PSEUDOMONAS AERUGINOSA (A)  Final      Susceptibility   Pseudomonas aeruginosa - MIC*    CEFTAZIDIME 2 SENSITIVE Sensitive     CIPROFLOXACIN <=0.25 SENSITIVE Sensitive     GENTAMICIN <=1 SENSITIVE Sensitive     IMIPENEM >=16 RESISTANT Resistant     PIP/TAZO <=4 SENSITIVE Sensitive     CEFEPIME 2 SENSITIVE Sensitive     * >=100,000 COLONIES/mL PSEUDOMONAS AERUGINOSA  Blood culture (routine x 2)     Status: None   Collection Time: 01/10/2020  5:11 AM   Specimen: BLOOD  Result Value Ref Range Status   Specimen Description BLOOD LEFT ASSIST CONTROL  Final   Special Requests   Final    BOTTLES DRAWN AEROBIC AND ANAEROBIC Blood Culture adequate volume   Culture   Final    NO GROWTH 5 DAYS Performed at [facility], [address], [place],  [address]    Report Status 01/13/2020 FINAL  Final  Blood culture (routine x 2)     Status: None  Collection Time: 01/25/2020  5:12 AM   Specimen: BLOOD  Result Value Ref Range Status   Specimen Description BLOOD RIGHT ASSIST CONTROL  Final   Special Requests   Final    BOTTLES DRAWN AEROBIC AND ANAEROBIC Blood Culture adequate volume   Culture   Final    NO GROWTH 5 DAYS Performed at [facility], [address] [place], [place] [address]    Report Status 01/13/2020 FINAL  Final  Aerobic/Anaerobic Culture (surgical/deep wound)     Status: None   Collection Time: 01/07/2020  5:30 PM   Specimen: Abscess  Result Value Ref Range Status   Specimen Description ABSCESS DRAINAGE  Final   Special Requests NONE  Final   Gram Stain   Final    ABUNDANT WBC PRESENT, PREDOMINANTLY PMN NO ORGANISMS SEEN    Culture   Final    FEW PSEUDOMONAS  AERUGINOSA FEW METHICILLIN RESISTANT STAPHYLOCOCCUS AUREUS NO ANAEROBES ISOLATED Performed at [facility], [place] [address] [place],  [address]    Report Status 01/13/2020 FINAL  Final   Organism ID, Bacteria PSEUDOMONAS AERUGINOSA  Final   Organism ID, Bacteria METHICILLIN RESISTANT STAPHYLOCOCCUS AUREUS  Final      Susceptibility   Methicillin resistant staphylococcus aureus - MIC*    CIPROFLOXACIN >=8 RESISTANT Resistant     ERYTHROMYCIN >=8 RESISTANT Resistant     GENTAMICIN <=0.5 SENSITIVE Sensitive     OXACILLIN >=4 RESISTANT Resistant     TETRACYCLINE <=1 SENSITIVE Sensitive     VANCOMYCIN 1 SENSITIVE Sensitive     TRIMETH/SULFA <=10 SENSITIVE Sensitive     CLINDAMYCIN >=8 RESISTANT Resistant     RIFAMPIN <=0.5 SENSITIVE Sensitive     Inducible Clindamycin NEGATIVE Sensitive     * FEW METHICILLIN RESISTANT STAPHYLOCOCCUS AUREUS   Pseudomonas aeruginosa - MIC*    CEFTAZIDIME 4 SENSITIVE Sensitive     CIPROFLOXACIN <=0.25 SENSITIVE Sensitive     GENTAMICIN <=1 SENSITIVE Sensitive     IMIPENEM >=16 RESISTANT Resistant     PIP/TAZO <=4 SENSITIVE Sensitive     CEFEPIME 2 SENSITIVE Sensitive     * FEW PSEUDOMONAS AERUGINOSA         Radiology Studies: CT ABDOMEN PELVIS WO CONTRAST  Result Date: 01/15/2020 CLINICAL DATA:  Urinary catheter displacement, unable to determine output EXAM: CT ABDOMEN AND PELVIS WITHOUT CONTRAST TECHNIQUE: Multidetector CT imaging of the abdomen and pelvis was performed following the standard protocol without IV contrast. COMPARISON:  January 14, 2020 FINDINGS: Lower chest: The visualized heart size within normal limits. No pericardial fluid/thickening. No hiatal hernia. Coronary artery calcifications are seen. Small bilateral pleural effusions are noted. Patchy ground-glass opacities are seen at both lung bases. Hepatobiliary: Although limited due to the lack of intravenous contrast, normal in appearance without gross focal abnormality.  No evidence of calcified gallstones or biliary ductal dilatation. Pancreas:  Unremarkable.  No surrounding inflammatory changes. Spleen: Normal in size. Although limited due to the lack of intravenous contrast, normal in appearance. Adrenals/Urinary Tract: Both adrenal glands appear normal. Bilateral renal atrophy is seen. No hydronephrosis. No renal or collecting system calculi. Foley catheter balloon is seen again inflated within the prostatic urethra. The tip appears to be just projecting into the partially decompressed bladder. There is a small amount of air seen at the vesicoureteral junction. There is contrast seen within the partially decompressed bladder with diffuse wall thickening and calcifications. Small foci of air seen within the bladder dome with a fistulous tract extending to  the left anterior abdominal wall collection. Stomach/Bowel: The stomach, small bowel, and colon are normal in appearance. No inflammatory changes or obstructive findings. Scattered colonic diverticula are noted. There is a moderate to large amount of colonic stool. Vascular/Lymphatic: There are no enlarged abdominal or pelvic lymph nodes. Scattered aortic atherosclerotic calcifications are seen without aneurysmal dilatation. Reproductive: Enlarged heterogeneous prostate gland. Other: A left lower anterior abdominal wall pigtail catheter is seen. There is a residual small collection measuring 2.9 cm which now appears to have a small amount of contrast seen within it. There are small foci of air at the dome of the bladder with a possible fistulous tract between the collection and the dome of the bladder, series 2, image 78. Musculoskeletal: No acute or significant osseous findings. IMPRESSION: 1. Small bilateral pleural effusions with adjacent patchy airspace consolidation, consistent with COVID pneumonia. 2. Foley catheter balloon dilated within the prosthetic urethra as on prior exam. 3. Contrast within a chronically thickened  partially decompressed bladder. 4. Left lower anterior abdominal pigtail catheter within a 2.9 cm collection that now contains contrast, there appears to be a small fistulous tract extending to the bladder dome where there are small foci of air as described above. 5. Diverticulosis without diverticulitis. 6.  Aortic Atherosclerosis (ICD10-I70.0). Electronically Signed   By: Prudencio Pair M.D.   On: 01/15/2020 05:14   CT ANGIO CHEST PE W OR WO CONTRAST  Result Date: 01/14/2020 CLINICAL DATA:  COVID-19 positive. Tachypnea. Hypoxia. Inpatient. Lower abdominal abscess status post percutaneous drainage on 01/20/2020. EXAM: CT ANGIOGRAPHY CHEST CT ABDOMEN AND PELVIS WITH CONTRAST TECHNIQUE: Multidetector CT imaging of the chest was performed using the standard protocol during bolus administration of intravenous contrast. Multiplanar CT image reconstructions and MIPs were obtained to evaluate the vascular anatomy. Multidetector CT imaging of the abdomen and pelvis was performed using the standard protocol during bolus administration of intravenous contrast. CONTRAST:  127mL OMNIPAQUE IOHEXOL 350 MG/ML SOLN COMPARISON:  01/02/2020 CT abdomen/pelvis. 12/30/2019 chest radiograph. FINDINGS: CTA CHEST FINDINGS Cardiovascular: The study is low-to-moderate quality for the evaluation of pulmonary embolism, with substantial motion degradation. There are no convincing filling defects in the central, lobar, segmental or subsegmental pulmonary artery branches to suggest acute pulmonary embolism. Atherosclerotic nonaneurysmal thoracic aorta. Dilated main pulmonary artery (3.6 cm diameter). Top-normal heart size. No significant pericardial fluid/thickening. Left anterior descending and right coronary atherosclerosis. Mediastinum/Nodes: Hypodense bilateral thyroid nodules, largest 2.4 cm on the left. Unremarkable esophagus. No pathologically enlarged axillary, mediastinal or hilar lymph nodes. Lungs/Pleura: No pneumothorax. Small  dependent bilateral pleural effusions. Extensive patchy consolidation and ground-glass opacity in the left greater than right lungs involving all lung lobes. No lung masses or discrete pulmonary nodules on these motion degraded images. Musculoskeletal: No aggressive appearing focal osseous lesions. Intact sternotomy wires. Moderate thoracic spondylosis. Review of the MIP images confirms the above findings. CT ABDOMEN and PELVIS FINDINGS Hepatobiliary: Normal liver with no liver mass. Normal gallbladder with no radiopaque cholelithiasis. No biliary ductal dilatation. Pancreas: Normal, with no mass or duct dilation. Spleen: Normal size. No mass. Adrenals/Urinary Tract: Normal right adrenal. Left adrenal 2.0 cm nodule with density 15 HU, unchanged using similar measurement technique in the short interval. No hydronephrosis. Scattered subcentimeter hypodense renal cortical lesions in both kidneys are too small to characterize and are unchanged. Hypodense exophytic 1.3 cm renal cortical lesion in the posterior lower left kidney (series 505/image 44), stable. Normal caliber ureters. Decompressed urinary bladder with chronic diffuse bladder wall thickening. Nonspecific gas within nondependent bladder  lumen is decreased. Foley catheter balloon inflated within the prostatic urethra with Foley tip at the vesicourethral junction. Stomach/Bowel: Normal non-distended stomach. Left-sided percutaneous pigtail drain terminates in the anterior midline lower peritoneal cavity with nearly resolved fluid collection in this location, now measuring 2.6 x 0.7 cm (series 505/image 66), previously 5.3 x 2.6 cm. No residual gas within this collection. Tip of drain remains in close proximity to the dome of the bladder. Fat stranding surrounding this collection has decreased. Normal caliber small bowel loops with no small bowel wall thickening. Appendix not discretely visualized. Scattered mild colonic diverticulosis with no large bowel wall  thickening or acute pericolonic fat stranding. Vascular/Lymphatic: Atherosclerotic nonaneurysmal abdominal aorta. Patent portal, splenic, hepatic and renal veins. No pathologically enlarged lymph nodes in the abdomen or pelvis. Reproductive: Marked prostatomegaly. Other: No pneumoperitoneum. No ascites. No new focal fluid collections. Musculoskeletal: No aggressive appearing focal osseous lesions. Marked lumbar spondylosis. Review of the MIP images confirms the above findings. IMPRESSION: 1. Limited motion degraded scan with no evidence of acute pulmonary embolism. 2. Extensive patchy consolidation and ground-glass opacity in the left greater than right lungs compatible with multilobar pneumonia due to reported COVID-19. 3. Small dependent bilateral pleural effusions. 4. Dilated main pulmonary artery, suggesting pulmonary arterial hypertension. 5. Near complete resolution of anterior midline lower peritoneal cavity fluid collection status post percutaneous drainage. Tip of percutaneous drain remains in close proximity to the dome of the bladder, with fistulization of this residual collection with the bladder lumen not excluded on the basis of this scan. Decreased gas within the nondependent bladder. 6. Foley catheter balloon is still dilated within the prostatic urethra with Foley tip at the vesicourethral junction. Recommend repositioning/replacement. Bladder is decompressed. No hydronephrosis. 7. Chronic mild diffuse bladder wall thickening is nonspecific and probably due to chronic bladder outlet obstruction by the markedly enlarged prostate. 8. Stable left adrenal nodule, probably an adenoma, for which follow-up adrenal protocol CT abdomen without and with IV contrast may be considered in 12 months. 9. Mild scattered colonic diverticulosis. 10.  Aortic Atherosclerosis (ICD10-I70.0). Electronically Signed   By: Ilona Sorrel M.D.   On: 01/14/2020 15:07   CT ABDOMEN PELVIS W CONTRAST  Result Date:  01/14/2020 CLINICAL DATA:  COVID-19 positive. Tachypnea. Hypoxia. Inpatient. Lower abdominal abscess status post percutaneous drainage on 01/09/2020. EXAM: CT ANGIOGRAPHY CHEST CT ABDOMEN AND PELVIS WITH CONTRAST TECHNIQUE: Multidetector CT imaging of the chest was performed using the standard protocol during bolus administration of intravenous contrast. Multiplanar CT image reconstructions and MIPs were obtained to evaluate the vascular anatomy. Multidetector CT imaging of the abdomen and pelvis was performed using the standard protocol during bolus administration of intravenous contrast. CONTRAST:  117mL OMNIPAQUE IOHEXOL 350 MG/ML SOLN COMPARISON:  01/14/2020 CT abdomen/pelvis. 12/30/2019 chest radiograph. FINDINGS: CTA CHEST FINDINGS Cardiovascular: The study is low-to-moderate quality for the evaluation of pulmonary embolism, with substantial motion degradation. There are no convincing filling defects in the central, lobar, segmental or subsegmental pulmonary artery branches to suggest acute pulmonary embolism. Atherosclerotic nonaneurysmal thoracic aorta. Dilated main pulmonary artery (3.6 cm diameter). Top-normal heart size. No significant pericardial fluid/thickening. Left anterior descending and right coronary atherosclerosis. Mediastinum/Nodes: Hypodense bilateral thyroid nodules, largest 2.4 cm on the left. Unremarkable esophagus. No pathologically enlarged axillary, mediastinal or hilar lymph nodes. Lungs/Pleura: No pneumothorax. Small dependent bilateral pleural effusions. Extensive patchy consolidation and ground-glass opacity in the left greater than right lungs involving all lung lobes. No lung masses or discrete pulmonary nodules on these motion degraded images.  Musculoskeletal: No aggressive appearing focal osseous lesions. Intact sternotomy wires. Moderate thoracic spondylosis. Review of the MIP images confirms the above findings. CT ABDOMEN and PELVIS FINDINGS Hepatobiliary: Normal liver with no  liver mass. Normal gallbladder with no radiopaque cholelithiasis. No biliary ductal dilatation. Pancreas: Normal, with no mass or duct dilation. Spleen: Normal size. No mass. Adrenals/Urinary Tract: Normal right adrenal. Left adrenal 2.0 cm nodule with density 15 HU, unchanged using similar measurement technique in the short interval. No hydronephrosis. Scattered subcentimeter hypodense renal cortical lesions in both kidneys are too small to characterize and are unchanged. Hypodense exophytic 1.3 cm renal cortical lesion in the posterior lower left kidney (series 505/image 44), stable. Normal caliber ureters. Decompressed urinary bladder with chronic diffuse bladder wall thickening. Nonspecific gas within nondependent bladder lumen is decreased. Foley catheter balloon inflated within the prostatic urethra with Foley tip at the vesicourethral junction. Stomach/Bowel: Normal non-distended stomach. Left-sided percutaneous pigtail drain terminates in the anterior midline lower peritoneal cavity with nearly resolved fluid collection in this location, now measuring 2.6 x 0.7 cm (series [id number]/image [id number]), previously 5.3 x 2.6 cm. No residual gas within this collection. Tip of drain remains in close proximity to the dome of the bladder. Fat stranding surrounding this collection has decreased. Normal caliber small bowel loops with no small bowel wall thickening. Appendix not discretely visualized. Scattered mild colonic diverticulosis with no large bowel wall thickening or acute pericolonic fat stranding. Vascular/Lymphatic: Atherosclerotic nonaneurysmal abdominal aorta. Patent portal, splenic, hepatic and renal veins. No pathologically enlarged lymph nodes in the abdomen or pelvis. Reproductive: Marked prostatomegaly. Other: No pneumoperitoneum. No ascites. No new focal fluid collections. Musculoskeletal: No aggressive appearing focal osseous lesions. Marked lumbar spondylosis. Review of the MIP images confirms the above  findings. IMPRESSION: 1. Limited motion degraded scan with no evidence of acute pulmonary embolism. 2. Extensive patchy consolidation and ground-glass opacity in the left greater than right lungs compatible with multilobar pneumonia due to reported COVID-19. 3. Small dependent bilateral pleural effusions. 4. Dilated main pulmonary artery, suggesting pulmonary arterial hypertension. 5. Near complete resolution of anterior midline lower peritoneal cavity fluid collection status post percutaneous drainage. Tip of percutaneous drain remains in close proximity to the dome of the bladder, with fistulization of this residual collection with the bladder lumen not excluded on the basis of this scan. Decreased gas within the nondependent bladder. 6. Foley catheter balloon is still dilated within the prostatic urethra with Foley tip at the vesicourethral junction. Recommend repositioning/replacement. Bladder is decompressed. No hydronephrosis. 7. Chronic mild diffuse bladder wall thickening is nonspecific and probably due to chronic bladder outlet obstruction by the markedly enlarged prostate. 8. Stable left adrenal nodule, probably an adenoma, for which follow-up adrenal protocol CT abdomen without and with IV contrast may be considered in 12 months. 9. Mild scattered colonic diverticulosis. 10.  Aortic Atherosclerosis (ICD10-I70.0). Electronically Signed   By: Ilona Sorrel M.D.   On: 01/14/2020 15:07        Scheduled Meds: . amLODipine  5 mg Oral Daily  . apixaban  2.5 mg Oral BID  . Chlorhexidine Gluconate Cloth  6 each Topical Daily  . dexamethasone (DECADRON) injection  6 mg Intravenous Daily  . feeding supplement (ENSURE ENLIVE)  237 mL Oral TID BM  . finasteride  5 mg Oral Daily  . furosemide  40 mg Intravenous Q12H  . gabapentin  100 mg Oral TID  . levETIRAcetam  250 mg Oral Daily  . levETIRAcetam  500 mg Oral QPM  .  metoprolol succinate  25 mg Oral Daily  . multivitamin with minerals  1 tablet Oral  Daily  . pantoprazole  40 mg Oral Daily  . sodium chloride flush  5 mL Intracatheter Q8H  . tamsulosin  0.4 mg Oral Daily   Continuous Infusions: . sodium chloride 500 mL (01/15/20 0914)  . amiodarone 30 mg/hr (01/16/20 0347)  . ceFEPime (MAXIPIME) IV    . magnesium sulfate bolus IVPB    . vancomycin 2,000 mg (01/15/20 0915)     LOS: 8 days    Time spent: 88 minutes    Ezekiel Slocumb, DO [facility]   If 7PM-7AM, please contact night-coverage [email or web address] Password [facility] 01/16/2020, 7:28 AM

## 2020-01-17 LAB — CBC WITH DIFFERENTIAL/PLATELET
Abs Immature Granulocytes: 0.42 10*3/uL — ABNORMAL HIGH (ref 0.00–0.07)
Basophils Absolute: 0 10*3/uL (ref 0.0–0.1)
Basophils Relative: 0 %
Eosinophils Absolute: 0 10*3/uL (ref 0.0–0.5)
Eosinophils Relative: 0 %
HCT: 30.4 % — ABNORMAL LOW (ref 39.0–52.0)
Hemoglobin: 10.1 g/dL — ABNORMAL LOW (ref 13.0–17.0)
Immature Granulocytes: 3 %
Lymphocytes Relative: 3 %
Lymphs Abs: 0.5 10*3/uL — ABNORMAL LOW (ref 0.7–4.0)
MCH: 30.5 pg (ref 26.0–34.0)
MCHC: 33.2 g/dL (ref 30.0–36.0)
MCV: 91.8 fL (ref 80.0–100.0)
Monocytes Absolute: 2.3 10*3/uL — ABNORMAL HIGH (ref 0.1–1.0)
Monocytes Relative: 17 %
Neutro Abs: 10.8 10*3/uL — ABNORMAL HIGH (ref 1.7–7.7)
Neutrophils Relative %: 77 %
Platelets: 123 10*3/uL — ABNORMAL LOW (ref 150–400)
RBC: 3.31 MIL/uL — ABNORMAL LOW (ref 4.22–5.81)
RDW: 14.5 % (ref 11.5–15.5)
WBC: 14.1 10*3/uL — ABNORMAL HIGH (ref 4.0–10.5)
nRBC: 0 % (ref 0.0–0.2)

## 2020-01-17 LAB — BASIC METABOLIC PANEL
Anion gap: 6 (ref 5–15)
BUN: 28 mg/dL — ABNORMAL HIGH (ref 8–23)
CO2: 26 mmol/L (ref 22–32)
Calcium: 7.7 mg/dL — ABNORMAL LOW (ref 8.9–10.3)
Chloride: 109 mmol/L (ref 98–111)
Creatinine, Ser: 0.72 mg/dL (ref 0.61–1.24)
GFR calc Af Amer: >60 mL/min (ref >60)
GFR calc non Af Amer: >60 mL/min (ref >60)
Glucose, Bld: 145 mg/dL — ABNORMAL HIGH (ref 70–99)
Potassium: 3.2 mmol/L — ABNORMAL LOW (ref 3.5–5.1)
Sodium: 141 mmol/L (ref 135–145)

## 2020-01-17 LAB — MAGNESIUM: Magnesium: 2.4 mg/dL (ref 1.7–2.4)

## 2020-01-17 MED ORDER — SODIUM CHLORIDE 0.9% FLUSH
10.0000 mL | Freq: Two times a day (BID) | INTRAVENOUS | Status: DC
  Administered 2020-01-17 – 2020-01-20 (×9): 10 mL

## 2020-01-17 MED ORDER — POTASSIUM CHLORIDE CRYS ER 20 MEQ PO TBCR
40.0000 meq | EXTENDED_RELEASE_TABLET | ORAL | Status: AC
  Administered 2020-01-17 (×2): 40 meq via ORAL
  Filled 2020-01-17 (×2): qty 2

## 2020-01-17 MED ORDER — SODIUM CHLORIDE 0.9% FLUSH: 10.0000 mL | INTRAVENOUS | Status: DC | PRN

## 2020-01-17 MED ORDER — FUROSEMIDE 10 MG/ML IJ SOLN
40.0000 mg | Freq: Two times a day (BID) | INTRAMUSCULAR | Status: DC
  Administered 2020-01-18 – 2020-01-20 (×5): 40 mg via INTRAVENOUS
  Filled 2020-01-17 (×5): qty 4

## 2020-01-17 NOTE — Progress Notes (Signed)
PT Cancellation Note  Patient Details Name: Caitlin Symes MRN: [id number] DOB: [birth year]   Cancelled Treatment:    Reason Eval/Treat Not Completed: Medical issues which prohibited therapy(Per chart review, patient noted with transfer to CCU due to uncontrolled afib.  Per guidelines, requires new orders to resume services after transfer to higher level of care.  Please reconsult as medically appropriate.)   Barton Want H. Owens Shark, PT, DPT, NCS 01/17/20, 9:33 PM [phone]

## 2020-01-17 NOTE — Progress Notes (Signed)
Pt has remained a & O during the shift, with moments of concern concerning situation, but easily oriented. Pt is no longer in mittens and has not pulled at line throughout the shift and has been cooperative and calm. On HF 15 L. Cardizem drip D/C.

## 2020-01-17 NOTE — Progress Notes (Signed)
Glasses, Cell phone and Charger in room with patient on bedside table. ICU 5. Family notified that items were in the room and would like them to remain with the patient.

## 2020-01-17 NOTE — Progress Notes (Signed)
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 Patient is now growing out Pseudomonas from his urine as well as from the abscess area,sensitive to Zosyn.Patient is also COVID-19 positive with acute hypoxia requiring 4 L/min and has associated tachypnea. He was previously treated for this during prior admission, discharged on 01/01/20. CTA chest obtained 1/14negative forPE.Increased oxygen requirements and tachypnea 1/15, in addition to A-flutter with RVR requiring amiodarone drip for rate control.  Subjective 1/17: Patient sleeping but awoke to voice this AM.  Reports he feels good.  No fever/chills, N/V/D, chest pain, SOB or other complaints.  No acute events reported.  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 --IR recommended keeping the drain at least until 01/16/2020,if not longer. --IR doesbelieve there is some communication between the bladder and they think it needs to be there longer so that can heal up. No further imagingneededunless his abdominal exam  worsens. --antibiotics per infectious disease --Continue vancomycin and cefepime --DO NOT replace or reposition Foley without contact urology.  Acute Hypoxic Respiratory Failure with hypoxia - in setting of recent Covid PNA Still requiring to 15 L/min HFNC today, this is up from 4 L on 1/14. CTA chest 1/14 was negative for PE but showed extensive patchy consolidation and ground-glass opacity in the left greater than right lungs involving all lung lobes, enlarged main pulmonary artery. Patient also with a-flutter RVR. --start IV dexamethasone in case this is post-covid issue --Lasix 40 mg IV twice daily as patient has positive fluid balance --supplement O2 for sat > 90% --wean down O2 as tolerated --HR control as below  Atrial Flutter with RVR- new onset 1/15. BP too soft for beta/calcium channel blockers initially. Not controlled with amio bolus alone. Transferred to stepdown for amiodarone gtt. Likely due to hypoxia.   Blood pressures better this morning. --cardiology consulted, Dr. Nehemiah Massed  --continue Cardizem drip as BP tolerates --stopped amio gtt today --maintain HR < 110 --maintain K>4, Mg>2  Chronic A-Fib --continue Eliquis --PO amio on hold while on gtt --continuehome Toprol  History of COVID-19pneumonia --Diagnosed with Covid on 12/30/2019 --s/p remdesivir and steroids discharged 01/01/2020 --persistentgroundglass opacities in bilateral lower lobes and right middle lobe on CT most likelypost-Covid inflammation. --patient without respiratory symptomsinitially, but now with tachypnea and15L/min oxygen requirement.Plan as above.   Marked leukocytosis- improving with antibiotics, but increased again today Improved after percutaneous drainage 56-->36--26--22--22--24-->32   Coronary disease, secondary prevention Hypertension Chronic diastolic CHF, potentially with decompensation contributing to degree of hypoxia --Euvolemic, blood pressure  normal --Continueamlodipine, metoprolol --Hold losartan for now, add back if blood pressure warrants. --Patient does not appear to be on antiplatelet agent at home. --Daily weights, strict ins and outs --1500 cc fluid restriction  BPH Patient had  Foley placed by urology, will need to be in for 3 weeks with outpatient follow-up with urology. --Continuetamsulosin and finasteride  GERD Continuepantoprazole  Seizure disorder Continues Keppra  Dementia without behavioral disturbance Monitor for behavioral disturbance  High risk of malnutrition He has moderate muscle mass and fat loss.  Longer-term plans Per Dr. Sherryl Manges of 01/12/20: "Conversation1/11/21with daughter [name] and her husband [name] who is a Software engineer about long-term plans for care for [name]. I noted it could be very difficult to eradicate abscess without surgery and long-term outcomelooks challenging. She states she will discuss the situation with her brother and sisters but at this point to continue with imaging and treatment as we are doing. She rightbring up the possibility of palliative care with them. On rediscussion1/11/2020 with son-in-lawBuddywire I updated him about marked improvement in abdominal exam as well as recommendations of radiology to continue present course and no further recommendations for imaging. He is concerned about patient's nutritional status and not wanting to eat which I concur with. Will place dietary consult for possible supplements. Patient is DNR"   DVT prophylaxis:on Eliquis Code Status: DNR Family Communication:Daughter and son-in-law updated by phone this morning, all questions answered  Disposition Plan:Pending clinical improvement and PT evaluation. Urology,general surgeryand IDfollowing. Patient requires continued hospital level caregiven need forIV antibiotics for intraabdominal abscess with possible fistula with drain in place, in  addition to persistentacute hypoxic respiratory failure   Consultants:  Urology  General surgery  Procedures:Placement of abdominal abscess drain percutaneously by IR  Antimicrobials:  Vancomycin - 1/8 to TBD  Zosyn - 1/8-1/14   Cipro - 1/14-1/15  Cefepime - 1/15 to TBD   Objective: Vitals:   01/17/20 0300 01/17/20 0400 01/17/20 0500 01/17/20 0600  BP: (!) 166/51 (!) 145/52 (!) 157/80 (!) 156/64  Pulse: 71 60 65 (!) 51  Resp: 16 18 (!) 28 (!) 30  Temp:  98.6 F (37 C)    TempSrc:  Axillary    SpO2: 93% 95% 91% 90%  Weight:   87.5 kg   Height:        Intake/Output Summary (Last 24 hours) at 01/17/2020 0832 Last data filed at 01/17/2020 0600 Gross per 24 hour  Intake 1500.26 ml  Output 2005 ml  Net -504.74 ml   Filed Weights   01/09/20 0009 01/13/20 1348 01/17/20 0500  Weight: 83.6 kg 90.1 kg 87.5 kg    Examination:  General exam: awake, alert, no acute distress, obese Respiratory system: decreased at bases otherwise clear, normal respiratory effort. Cardiovascular system: normal S1/S2, RRR, no JVD, murmurs, rubs, gallops, no pedal edema.   Gastrointestinal system: soft, non-tender, non-distended abdomen, no organomegaly or masses felt, normal bowel sounds. Central nervous system: alert and oriented x3. no gross focal neurologic deficits, normal speech Extremities: no cyanosis, normal tone Psychiatry: normal mood, congruent affect, judgement and insight appear normal    Data Reviewed: I have personally reviewed following labs and imaging studies  CBC: Recent Labs  Lab 01/13/20 0517 01/14/20 0450 01/15/20 0544 01/16/20 0647 01/17/20 0618  WBC 24.4* 32.7* 32.7* 15.0* 14.1*  NEUTROABS  --  24.4* 25.6*  --  10.8*  HGB 10.7* 11.1* 11.0* 10.9* 10.1*  HCT 31.3* 32.4* 32.7* 33.2* 30.4*  MCV 91.3 90.8 92.1 93.0 91.8  PLT 120* 118* 118* 122* [id number]*   Basic Metabolic Panel: Recent Labs  Lab 01/13/20 0517 01/14/20 0450 01/15/20 0544  01/16/20 0647 01/17/20 0618  NA 136 138 139 139 141  K 3.4* 3.3* 4.0 3.8 3.2*  CL 105 105 107 105 109  CO2 24 25 24 23 26   GLUCOSE 92 129* 107* 144* 145*  BUN 8 11 14 21  28*  CREATININE 0.50* 0.55* 0.69 0.67 0.72  CALCIUM 7.5* 7.8* 7.9* 7.9* 7.7*  MG  --  1.6* 1.7 2.2 2.4   GFR: Estimated Creatinine Clearance: 78.4 mL/min (by C-G formula based on SCr of 0.72 mg/dL). Liver Function Tests: Recent Labs  Lab 01/11/20 0611 01/12/20 0505 01/16/20 0647  AST 15 16 18   ALT 15 17 19   ALKPHOS 48 49 65  BILITOT 1.1 1.2 0.9  PROT 5.2* 5.1* 5.6*  ALBUMIN 2.0* 1.9* 1.9*   No results for input(s): LIPASE, AMYLASE in the last 168 hours. No results for input(s): AMMONIA in the last 168 hours. Coagulation Profile: No results for input(s): INR, PROTIME in the last 168 hours. Cardiac Enzymes: No results for input(s): CKTOTAL, CKMB, CKMBINDEX, TROPONINI in the last 168 hours. BNP (last 3 results) No results for input(s): PROBNP in the last 8760 hours. HbA1C: No results for input(s): HGBA1C in the last 72 hours. CBG: No results for input(s): GLUCAP in the last 168 hours. Lipid Profile: No results for input(s): CHOL, HDL, LDLCALC, TRIG, CHOLHDL, LDLDIRECT in the last 72 hours. Thyroid Function Tests: No results for input(s): TSH, T4TOTAL, FREET4, T3FREE, THYROIDAB in the last 72 hours. Anemia Panel: No results for input(s): VITAMINB12, FOLATE, FERRITIN, TIBC, IRON, RETICCTPCT in the last 72 hours. Sepsis Labs: Recent Labs  Lab 01/14/20 0450  PROCALCITON 0.31    Recent Results (from the past 240 hour(s))  Urine culture     Status: Abnormal   Collection Time: 01/28/2020  2:32 AM   Specimen: Urine, Random  Result Value Ref Range Status   Specimen Description   Final    URINE, RANDOM Performed at [facility], [address], [place], [place] [address]    Special Requests   Final    NONE Performed at [facility], [address] [place], [place] [address]     Culture >=100,000 COLONIES/mL PSEUDOMONAS AERUGINOSA (A)  Final   Report Status 01/10/2020 FINAL  Final   Organism ID, Bacteria PSEUDOMONAS AERUGINOSA (A)  Final      Susceptibility   Pseudomonas aeruginosa - MIC*    CEFTAZIDIME 2 SENSITIVE Sensitive     CIPROFLOXACIN <=0.25 SENSITIVE Sensitive     GENTAMICIN <=1 SENSITIVE Sensitive     IMIPENEM >=16 RESISTANT Resistant     PIP/TAZO <=4 SENSITIVE Sensitive     CEFEPIME 2 SENSITIVE Sensitive     * >=100,000 COLONIES/mL PSEUDOMONAS AERUGINOSA  Blood culture (routine x 2)     Status: None   Collection Time: 01/18/2020  5:11 AM   Specimen: BLOOD  Result Value Ref Range Status   Specimen Description BLOOD LEFT ASSIST CONTROL  Final   Special Requests   Final    BOTTLES DRAWN AEROBIC AND ANAEROBIC Blood Culture adequate volume   Culture   Final    NO GROWTH 5 DAYS Performed at [facility], [address], [place], [place] [address]    Report Status 01/13/2020 FINAL  Final  Blood culture (routine x 2)     Status: None   Collection Time: 01/26/2020  5:12 AM   Specimen: BLOOD  Result Value Ref Range Status   Specimen Description BLOOD RIGHT ASSIST CONTROL  Final   Special Requests   Final    BOTTLES DRAWN AEROBIC AND ANAEROBIC Blood Culture adequate volume   Culture  Final    NO GROWTH 5 DAYS Performed at [facility], [address] [place], [place] [address]    Report Status 01/13/2020 FINAL  Final  Aerobic/Anaerobic Culture (surgical/deep wound)     Status: None   Collection Time: 01/15/2020  5:30 PM   Specimen: Abscess  Result Value Ref Range Status   Specimen Description ABSCESS DRAINAGE  Final   Special Requests NONE  Final   Gram Stain   Final    ABUNDANT WBC PRESENT, PREDOMINANTLY PMN NO ORGANISMS SEEN    Culture   Final    FEW PSEUDOMONAS AERUGINOSA FEW METHICILLIN RESISTANT STAPHYLOCOCCUS AUREUS NO ANAEROBES ISOLATED Performed at [facility], [place] [address], [place], [place] [address]     Report Status 01/13/2020 FINAL  Final   Organism ID, Bacteria PSEUDOMONAS AERUGINOSA  Final   Organism ID, Bacteria METHICILLIN RESISTANT STAPHYLOCOCCUS AUREUS  Final      Susceptibility   Methicillin resistant staphylococcus aureus - MIC*    CIPROFLOXACIN >=8 RESISTANT Resistant     ERYTHROMYCIN >=8 RESISTANT Resistant     GENTAMICIN <=0.5 SENSITIVE Sensitive     OXACILLIN >=4 RESISTANT Resistant     TETRACYCLINE <=1 SENSITIVE Sensitive     VANCOMYCIN 1 SENSITIVE Sensitive     TRIMETH/SULFA <=10 SENSITIVE Sensitive     CLINDAMYCIN >=8 RESISTANT Resistant     RIFAMPIN <=0.5 SENSITIVE Sensitive     Inducible Clindamycin NEGATIVE Sensitive     * FEW METHICILLIN RESISTANT STAPHYLOCOCCUS AUREUS   Pseudomonas aeruginosa - MIC*    CEFTAZIDIME 4 SENSITIVE Sensitive     CIPROFLOXACIN <=0.25 SENSITIVE Sensitive     GENTAMICIN <=1 SENSITIVE Sensitive     IMIPENEM >=16 RESISTANT Resistant     PIP/TAZO <=4 SENSITIVE Sensitive     CEFEPIME 2 SENSITIVE Sensitive     * FEW PSEUDOMONAS AERUGINOSA         Radiology Studies: No results found.      Scheduled Meds: . amiodarone  100 mg Oral Daily  . amLODipine  5 mg Oral Daily  . apixaban  2.5 mg Oral BID  . Chlorhexidine Gluconate Cloth  6 each Topical Daily  . dexamethasone (DECADRON) injection  6 mg Intravenous Daily  . feeding supplement (ENSURE ENLIVE)  237 mL Oral TID BM  . finasteride  5 mg Oral Daily  . gabapentin  100 mg Oral TID  . levETIRAcetam  250 mg Oral Daily  . levETIRAcetam  500 mg Oral QPM  . metoprolol succinate  25 mg Oral Daily  . multivitamin with minerals  1 tablet Oral Daily  . pantoprazole  40 mg Oral Daily  . sodium chloride flush  10-40 mL Intracatheter Q12H  . sodium chloride flush  5 mL Intracatheter Q8H  . tamsulosin  0.4 mg Oral Daily   Continuous Infusions: . sodium chloride 5 mL/hr at 01/17/20 0600  . ceFEPime (MAXIPIME) IV Stopped (01/17/20 0052)  . diltiazem (CARDIZEM) infusion 5 mg/hr  (01/17/20 0600)  . vancomycin 2,000 mg (01/16/20 1054)     LOS: 9 days    Time spent: 30 minutes    Ezekiel Slocumb, DO [facility]   If 7PM-7AM, please contact night-coverage [email or web address] Password [facility] 01/17/2020, 8:32 AM

## 2020-01-17 NOTE — Progress Notes (Signed)
[facility] Encounter Note  Patient: Jimmy Moore / Admit Date: 01/13/2020 / Date of Encounter: 01/17/2020, 7:29 AM   Subjective: Patient continues to have slow recovery and improvements with treatment of abdominal abscess from diverticulitis and Covid pneumonia with complications.  The patient had atrial flutter with rapid ventricular rate for which now has spontaneously converted to normal sinus rhythm with the combination of intravenous amiodarone and diltiazem.  There has been no evidence of significant cardiovascular complication from above. Echocardiogram showing normal LV systolic function with no evidence of significant valvular heart disease and no current evidence of congestive heart failure or myocardial infarction  Review of Systems: Cannot assess  objective: Telemetry: Normal sinus rhythm Physical Exam: Blood pressure (!) 156/64, pulse (!) 51, temperature 98.6 F (37 C), temperature source Axillary, resp. rate (!) 30, height 6\' 4"  (1.93 m), weight 87.5 kg, SpO2 90 %. Body mass index is 23.48 kg/m. The remainder of physical exam as per prime doc   Intake/Output Summary (Last 24 hours) at 01/17/2020 0729 Last data filed at 01/17/2020 0600 Gross per 24 hour  Intake 1500.26 ml  Output 2005 ml  Net -504.74 ml    Inpatient Medications:  . amiodarone  100 mg Oral Daily  . amLODipine  5 mg Oral Daily  . apixaban  2.5 mg Oral BID  . Chlorhexidine Gluconate Cloth  6 each Topical Daily  . dexamethasone (DECADRON) injection  6 mg Intravenous Daily  . feeding supplement (ENSURE ENLIVE)  237 mL Oral TID BM  . finasteride  5 mg Oral Daily  . gabapentin  100 mg Oral TID  . levETIRAcetam  250 mg Oral Daily  . levETIRAcetam  500 mg Oral QPM  . metoprolol succinate  25 mg Oral Daily  . multivitamin with minerals  1 tablet Oral Daily  . pantoprazole  40 mg Oral Daily  . sodium chloride flush  10-40 mL Intracatheter Q12H  . sodium chloride flush  5 mL  Intracatheter Q8H  . tamsulosin  0.4 mg Oral Daily   Infusions:  . sodium chloride 5 mL/hr at 01/17/20 0600  . ceFEPime (MAXIPIME) IV Stopped (01/17/20 0052)  . diltiazem (CARDIZEM) infusion 5 mg/hr (01/17/20 0600)  . vancomycin 2,000 mg (01/16/20 1054)    Labs: Recent Labs    01/16/20 0647 01/17/20 0618  NA 139 141  K 3.8 3.2*  CL 105 109  CO2 23 26  GLUCOSE 144* 145*  BUN 21 28*  CREATININE 0.67 0.72  CALCIUM 7.9* 7.7*  MG 2.2 2.4   Recent Labs    01/16/20 0647  AST 18  ALT 19  ALKPHOS 65  BILITOT 0.9  PROT 5.6*  ALBUMIN 1.9*   Recent Labs    01/15/20 0544 01/15/20 0544 01/16/20 0647 01/17/20 0618  WBC 32.7*   < > 15.0* 14.1*  NEUTROABS 25.6*  --   --  10.8*  HGB 11.0*   < > 10.9* 10.1*  HCT 32.7*   < > 33.2* 30.4*  MCV 92.1   < > 93.0 91.8  PLT 118*   < > 122* 123*   < > = values in this interval not displayed.   No results for input(s): CKTOTAL, CKMB, TROPONINI in the last 72 hours. Invalid input(s): POCBNP No results for input(s): HGBA1C in the last 72 hours.   Weights: Filed Weights   01/09/20 0009 01/13/20 1348 01/17/20 0500  Weight: 83.6 kg 90.1 kg 87.5 kg     Radiology/Studies:  CT ABDOMEN PELVIS WO  CONTRAST  Result Date: 01/15/2020 CLINICAL DATA:  Urinary catheter displacement, unable to determine output EXAM: CT ABDOMEN AND PELVIS WITHOUT CONTRAST TECHNIQUE: Multidetector CT imaging of the abdomen and pelvis was performed following the standard protocol without IV contrast. COMPARISON:  January 14, 2020 FINDINGS: Lower chest: The visualized heart size within normal limits. No pericardial fluid/thickening. No hiatal hernia. Coronary artery calcifications are seen. Small bilateral pleural effusions are noted. Patchy ground-glass opacities are seen at both lung bases. Hepatobiliary: Although limited due to the lack of intravenous contrast, normal in appearance without gross focal abnormality. No evidence of calcified gallstones or biliary ductal  dilatation. Pancreas:  Unremarkable.  No surrounding inflammatory changes. Spleen: Normal in size. Although limited due to the lack of intravenous contrast, normal in appearance. Adrenals/Urinary Tract: Both adrenal glands appear normal. Bilateral renal atrophy is seen. No hydronephrosis. No renal or collecting system calculi. Foley catheter balloon is seen again inflated within the prostatic urethra. The tip appears to be just projecting into the partially decompressed bladder. There is a small amount of air seen at the vesicoureteral junction. There is contrast seen within the partially decompressed bladder with diffuse wall thickening and calcifications. Small foci of air seen within the bladder dome with a fistulous tract extending to the left anterior abdominal wall collection. Stomach/Bowel: The stomach, small bowel, and colon are normal in appearance. No inflammatory changes or obstructive findings. Scattered colonic diverticula are noted. There is a moderate to large amount of colonic stool. Vascular/Lymphatic: There are no enlarged abdominal or pelvic lymph nodes. Scattered aortic atherosclerotic calcifications are seen without aneurysmal dilatation. Reproductive: Enlarged heterogeneous prostate gland. Other: A left lower anterior abdominal wall pigtail catheter is seen. There is a residual small collection measuring 2.9 cm which now appears to have a small amount of contrast seen within it. There are small foci of air at the dome of the bladder with a possible fistulous tract between the collection and the dome of the bladder, series 2, image 78. Musculoskeletal: No acute or significant osseous findings. IMPRESSION: 1. Small bilateral pleural effusions with adjacent patchy airspace consolidation, consistent with COVID pneumonia. 2. Foley catheter balloon dilated within the prosthetic urethra as on prior exam. 3. Contrast within a chronically thickened partially decompressed bladder. 4. Left lower anterior  abdominal pigtail catheter within a 2.9 cm collection that now contains contrast, there appears to be a small fistulous tract extending to the bladder dome where there are small foci of air as described above. 5. Diverticulosis without diverticulitis. 6.  Aortic Atherosclerosis (ICD10-I70.0). Electronically Signed   By: Prudencio Pair M.D.   On: 01/15/2020 05:14   DG Chest 1 View  Result Date: 12/30/2019 CLINICAL DATA:  Dyspnea EXAM: CHEST  1 VIEW COMPARISON:  07/21/2013 FINDINGS: Cardiac shadow is stable. Postsurgical changes are again noted. Lungs are well aerated bilaterally. Chronic blunting of the left costophrenic angle is noted. Some patchy airspace opacity is noted in the right mid lung laterally which may represent some early infiltrate. No other focal abnormality is noted. IMPRESSION: Patchy airspace opacity in the right mid lung. Electronically Signed   By: Inez Catalina M.D.   On: 12/30/2019 12:57   CT ANGIO CHEST PE W OR WO CONTRAST  Result Date: 01/14/2020 CLINICAL DATA:  COVID-19 positive. Tachypnea. Hypoxia. Inpatient. Lower abdominal abscess status post percutaneous drainage on 01/22/2020. EXAM: CT ANGIOGRAPHY CHEST CT ABDOMEN AND PELVIS WITH CONTRAST TECHNIQUE: Multidetector CT imaging of the chest was performed using the standard protocol during bolus administration of  intravenous contrast. Multiplanar CT image reconstructions and MIPs were obtained to evaluate the vascular anatomy. Multidetector CT imaging of the abdomen and pelvis was performed using the standard protocol during bolus administration of intravenous contrast. CONTRAST:  172mL OMNIPAQUE IOHEXOL 350 MG/ML SOLN COMPARISON:  01/30/2020 CT abdomen/pelvis. 12/30/2019 chest radiograph. FINDINGS: CTA CHEST FINDINGS Cardiovascular: The study is low-to-moderate quality for the evaluation of pulmonary embolism, with substantial motion degradation. There are no convincing filling defects in the central, lobar, segmental or subsegmental  pulmonary artery branches to suggest acute pulmonary embolism. Atherosclerotic nonaneurysmal thoracic aorta. Dilated main pulmonary artery (3.6 cm diameter). Top-normal heart size. No significant pericardial fluid/thickening. Left anterior descending and right coronary atherosclerosis. Mediastinum/Nodes: Hypodense bilateral thyroid nodules, largest 2.4 cm on the left. Unremarkable esophagus. No pathologically enlarged axillary, mediastinal or hilar lymph nodes. Lungs/Pleura: No pneumothorax. Small dependent bilateral pleural effusions. Extensive patchy consolidation and ground-glass opacity in the left greater than right lungs involving all lung lobes. No lung masses or discrete pulmonary nodules on these motion degraded images. Musculoskeletal: No aggressive appearing focal osseous lesions. Intact sternotomy wires. Moderate thoracic spondylosis. Review of the MIP images confirms the above findings. CT ABDOMEN and PELVIS FINDINGS Hepatobiliary: Normal liver with no liver mass. Normal gallbladder with no radiopaque cholelithiasis. No biliary ductal dilatation. Pancreas: Normal, with no mass or duct dilation. Spleen: Normal size. No mass. Adrenals/Urinary Tract: Normal right adrenal. Left adrenal 2.0 cm nodule with density 15 HU, unchanged using similar measurement technique in the short interval. No hydronephrosis. Scattered subcentimeter hypodense renal cortical lesions in both kidneys are too small to characterize and are unchanged. Hypodense exophytic 1.3 cm renal cortical lesion in the posterior lower left kidney (series 505/image 44), stable. Normal caliber ureters. Decompressed urinary bladder with chronic diffuse bladder wall thickening. Nonspecific gas within nondependent bladder lumen is decreased. Foley catheter balloon inflated within the prostatic urethra with Foley tip at the vesicourethral junction. Stomach/Bowel: Normal non-distended stomach. Left-sided percutaneous pigtail drain terminates in the  anterior midline lower peritoneal cavity with nearly resolved fluid collection in this location, now measuring 2.6 x 0.7 cm (series 505/image 66), previously 5.3 x 2.6 cm. No residual gas within this collection. Tip of drain remains in close proximity to the dome of the bladder. Fat stranding surrounding this collection has decreased. Normal caliber small bowel loops with no small bowel wall thickening. Appendix not discretely visualized. Scattered mild colonic diverticulosis with no large bowel wall thickening or acute pericolonic fat stranding. Vascular/Lymphatic: Atherosclerotic nonaneurysmal abdominal aorta. Patent portal, splenic, hepatic and renal veins. No pathologically enlarged lymph nodes in the abdomen or pelvis. Reproductive: Marked prostatomegaly. Other: No pneumoperitoneum. No ascites. No new focal fluid collections. Musculoskeletal: No aggressive appearing focal osseous lesions. Marked lumbar spondylosis. Review of the MIP images confirms the above findings. IMPRESSION: 1. Limited motion degraded scan with no evidence of acute pulmonary embolism. 2. Extensive patchy consolidation and ground-glass opacity in the left greater than right lungs compatible with multilobar pneumonia due to reported COVID-19. 3. Small dependent bilateral pleural effusions. 4. Dilated main pulmonary artery, suggesting pulmonary arterial hypertension. 5. Near complete resolution of anterior midline lower peritoneal cavity fluid collection status post percutaneous drainage. Tip of percutaneous drain remains in close proximity to the dome of the bladder, with fistulization of this residual collection with the bladder lumen not excluded on the basis of this scan. Decreased gas within the nondependent bladder. 6. Foley catheter balloon is still dilated within the prostatic urethra with Foley tip at the vesicourethral junction. Recommend  repositioning/replacement. Bladder is decompressed. No hydronephrosis. 7. Chronic mild diffuse  bladder wall thickening is nonspecific and probably due to chronic bladder outlet obstruction by the markedly enlarged prostate. 8. Stable left adrenal nodule, probably an adenoma, for which follow-up adrenal protocol CT abdomen without and with IV contrast may be considered in 12 months. 9. Mild scattered colonic diverticulosis. 10.  Aortic Atherosclerosis (ICD10-I70.0). Electronically Signed   By: Ilona Sorrel M.D.   On: 01/14/2020 15:07   CT ABDOMEN PELVIS W CONTRAST  Result Date: 01/14/2020 CLINICAL DATA:  COVID-19 positive. Tachypnea. Hypoxia. Inpatient. Lower abdominal abscess status post percutaneous drainage on 01/26/2020. EXAM: CT ANGIOGRAPHY CHEST CT ABDOMEN AND PELVIS WITH CONTRAST TECHNIQUE: Multidetector CT imaging of the chest was performed using the standard protocol during bolus administration of intravenous contrast. Multiplanar CT image reconstructions and MIPs were obtained to evaluate the vascular anatomy. Multidetector CT imaging of the abdomen and pelvis was performed using the standard protocol during bolus administration of intravenous contrast. CONTRAST:  124mL OMNIPAQUE IOHEXOL 350 MG/ML SOLN COMPARISON:  01/13/2020 CT abdomen/pelvis. 12/30/2019 chest radiograph. FINDINGS: CTA CHEST FINDINGS Cardiovascular: The study is low-to-moderate quality for the evaluation of pulmonary embolism, with substantial motion degradation. There are no convincing filling defects in the central, lobar, segmental or subsegmental pulmonary artery branches to suggest acute pulmonary embolism. Atherosclerotic nonaneurysmal thoracic aorta. Dilated main pulmonary artery (3.6 cm diameter). Top-normal heart size. No significant pericardial fluid/thickening. Left anterior descending and right coronary atherosclerosis. Mediastinum/Nodes: Hypodense bilateral thyroid nodules, largest 2.4 cm on the left. Unremarkable esophagus. No pathologically enlarged axillary, mediastinal or hilar lymph nodes. Lungs/Pleura: No  pneumothorax. Small dependent bilateral pleural effusions. Extensive patchy consolidation and ground-glass opacity in the left greater than right lungs involving all lung lobes. No lung masses or discrete pulmonary nodules on these motion degraded images. Musculoskeletal: No aggressive appearing focal osseous lesions. Intact sternotomy wires. Moderate thoracic spondylosis. Review of the MIP images confirms the above findings. CT ABDOMEN and PELVIS FINDINGS Hepatobiliary: Normal liver with no liver mass. Normal gallbladder with no radiopaque cholelithiasis. No biliary ductal dilatation. Pancreas: Normal, with no mass or duct dilation. Spleen: Normal size. No mass. Adrenals/Urinary Tract: Normal right adrenal. Left adrenal 2.0 cm nodule with density 15 HU, unchanged using similar measurement technique in the short interval. No hydronephrosis. Scattered subcentimeter hypodense renal cortical lesions in both kidneys are too small to characterize and are unchanged. Hypodense exophytic 1.3 cm renal cortical lesion in the posterior lower left kidney (series 505/image 44), stable. Normal caliber ureters. Decompressed urinary bladder with chronic diffuse bladder wall thickening. Nonspecific gas within nondependent bladder lumen is decreased. Foley catheter balloon inflated within the prostatic urethra with Foley tip at the vesicourethral junction. Stomach/Bowel: Normal non-distended stomach. Left-sided percutaneous pigtail drain terminates in the anterior midline lower peritoneal cavity with nearly resolved fluid collection in this location, now measuring 2.6 x 0.7 cm (series 505/image 66), previously 5.3 x 2.6 cm. No residual gas within this collection. Tip of drain remains in close proximity to the dome of the bladder. Fat stranding surrounding this collection has decreased. Normal caliber small bowel loops with no small bowel wall thickening. Appendix not discretely visualized. Scattered mild colonic diverticulosis with  no large bowel wall thickening or acute pericolonic fat stranding. Vascular/Lymphatic: Atherosclerotic nonaneurysmal abdominal aorta. Patent portal, splenic, hepatic and renal veins. No pathologically enlarged lymph nodes in the abdomen or pelvis. Reproductive: Marked prostatomegaly. Other: No pneumoperitoneum. No ascites. No new focal fluid collections. Musculoskeletal: No aggressive appearing focal osseous lesions. Marked  lumbar spondylosis. Review of the MIP images confirms the above findings. IMPRESSION: 1. Limited motion degraded scan with no evidence of acute pulmonary embolism. 2. Extensive patchy consolidation and ground-glass opacity in the left greater than right lungs compatible with multilobar pneumonia due to reported COVID-19. 3. Small dependent bilateral pleural effusions. 4. Dilated main pulmonary artery, suggesting pulmonary arterial hypertension. 5. Near complete resolution of anterior midline lower peritoneal cavity fluid collection status post percutaneous drainage. Tip of percutaneous drain remains in close proximity to the dome of the bladder, with fistulization of this residual collection with the bladder lumen not excluded on the basis of this scan. Decreased gas within the nondependent bladder. 6. Foley catheter balloon is still dilated within the prostatic urethra with Foley tip at the vesicourethral junction. Recommend repositioning/replacement. Bladder is decompressed. No hydronephrosis. 7. Chronic mild diffuse bladder wall thickening is nonspecific and probably due to chronic bladder outlet obstruction by the markedly enlarged prostate. 8. Stable left adrenal nodule, probably an adenoma, for which follow-up adrenal protocol CT abdomen without and with IV contrast may be considered in 12 months. 9. Mild scattered colonic diverticulosis. 10.  Aortic Atherosclerosis (ICD10-I70.0). Electronically Signed   By: Ilona Sorrel M.D.   On: 01/14/2020 15:07   CT ABDOMEN PELVIS W CONTRAST  Result  Date: 01/10/2020 CLINICAL DATA:  Unspecified abdominal pain, COVID-19 positive EXAM: CT ABDOMEN AND PELVIS WITH CONTRAST TECHNIQUE: Multidetector CT imaging of the abdomen and pelvis was performed using the standard protocol following bolus administration of intravenous contrast. CONTRAST:  152mL OMNIPAQUE IOHEXOL 300 MG/ML  SOLN COMPARISON:  None. FINDINGS: Lower chest: Mixed ground-glass, consolidative and reticular opacities are present in the lower lobes, right middle lobe and lingula compatible with atypical infection in the setting of COVID-19 positivity. Mild airways thickening is present as well. Hepatobiliary: No focal liver abnormality is seen. No gallstones, gallbladder wall thickening, or biliary dilatation. Pancreas: Fatty replacement of the pancreas. No pancreatic ductal dilatation or surrounding inflammatory changes. Spleen: Normal in size without focal abnormality. 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 No worrisome adrenal lesions. Mild bilateral symmetric perinephric stranding, a nonspecific finding though may correlate with either age or decreased renal function. Kidneys enhance and excrete symmetrically. Some mild left urothelial thickening and periureteral stranding is present. The urinary bladder is circumferentially thickened with multiple foci intraluminal gas. Several punctate foci of gas are seen within the wall of the bladder. These could feasibly reflect gas within small bladder diverticula or crenulation given an enlarged prostate and likely sequela of chronic outlet obstruction however emphysematous cystitis is not fully excluded. Furthermore, there is focal discontinuity along the dome of the bladder contiguous with a an irregular air and fluid containing collection in the midline abdomen closely apposed to several loops of thickened and enhancing small bowel. An  enterovesicular fistula is suspected. Stomach/Bowel: Distal esophagus, stomach and duodenal sweep are unremarkable. There are few focally thickened segments of small bowel with irregular mural enhancement and edematous change in the low anterior mid abdomen centered upon a rim enhancing 5.2 x 2.6 x 7.7cm air and fluid containing collection worrisome for developing abscess or potential enterovesicular fistula given direct continuity the dome of the bladder as detailed above. More distal small bowel has a normal appearance. Appendix is not well visualized. No focal pericecal inflammation is seen. Pancolonic diverticulosis without evidence of acute diverticulitis. Vascular/Lymphatic: Extensive atherosclerotic calcification of the abdominal aorta,  iliac arteries and branch vessels. No aneurysm or ectasia is seen. Reactive adenopathy in the low mesentery with some geographic mesenteric stranding involving the affected bowel loops detailed above. Reproductive: The prostate is markedly enlarged. A Foley catheter appears inflated in the low prosthetic urethra. Some focal hypoattenuating cystic foci within the prostate could reflect possible abscess given the adjacent inflammation and stranding. Other: Phlegmonous change in the mid mesentery with the air and fluid containing collection are arising from the dome of the bladder and or bowel loops. No free intraperitoneal air is seen. No bowel containing hernias. Musculoskeletal: Atrophy of the anterior abdominal wall musculature Multilevel degenerative changes are present in the imaged portions of the spine. No acute osseous abnormality or suspicious osseous lesion. Prior sternotomy changes are noted. Additional degenerative changes noted in the hips and SI joints bilaterally as well as the symphysis pubis. IMPRESSION: 1. 5.2 x 2.6 x 7.7cm rim enhancing air and fluid containing collection in the low anterior mid abdomen. Collection is directly contiguous with the inflamed  bladder dome with several adjacent loops of edematous and thickened small bowel. Overall appearance is worrisome for an abscess with possible enterovesicular fistulization. 2. Bladder itself is circumferentially thickened with gas in the bladder wall. While these foci of air could be within granulations of the bladder given an enlarged prostate and likely some sequela of chronic outlet obstruction, given the patient's extreme white count features are highly suspicious for an emphysematous cystitis. 3. Inflated Foley catheter balloon is positioned within the prostatic urethra. Recommend repositioning or removal. 4. Rim enhancing low attenuation collection within the prostate parenchyma worrisome for potential abscess. 5. Mixed ground-glass, consolidative and reticular opacities in the lower lobes, right middle lobe and lingula compatible with atypical infection in the setting of COVID positivity positivity. 6. Colonic diverticulosis without evidence of diverticulitis. 7. 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 8.  Aortic Atherosclerosis (ICD10-I70.0). These results were called by telephone at the time of interpretation on 01/31/2020 at 4:46 am to provider [facility] , who verbally acknowledged these results. Electronically Signed   By: Lovena Le M.D.   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 Please perform CT-guided percutaneous drainage catheter placement for infection source control purposes. EXAM: CT IMAGE GUIDED DRAINAGE BY PERCUTANEOUS CATHETER COMPARISON:  CT abdomen pelvis-01/20/2020 MEDICATIONS: The patient is currently admitted to the hospital and receiving intravenous antibiotics. The antibiotics were administered within an appropriate time frame prior to the initiation of the procedure. ANESTHESIA/SEDATION: Moderate (conscious)  sedation was employed during this procedure. A total of Versed 0.5 mg and Fentanyl 50 mcg was administered intravenously. Moderate Sedation Time: 11 minutes. The patient's level of consciousness and vital signs were monitored continuously by radiology nursing throughout the procedure under my direct supervision. CONTRAST:  None COMPLICATIONS: None immediate. PROCEDURE: Informed written consent was obtained from the patient after a discussion of the risks, benefits and alternatives to treatment. The patient was placed supine on the CT gantry and a pre procedural CT was performed re-demonstrating the known abscess/fluid collection within the ventral aspect the lower abdomen with dominant mixed air in fluid containing component measuring approximately 5.0 x 1.9 cm (image 21, series 2). The procedure was planned. A timeout was performed prior to the initiation of the procedure. The skin overlying the ventral aspect of the lower abdomen/pelvis was prepped and draped in  the usual sterile fashion. The overlying soft tissues were anesthetized with 1% lidocaine with epinephrine. Appropriate trajectory was planned with the use of a 22 gauge spinal needle. An 18 gauge trocar needle was advanced into the abscess/fluid collection and a short Amplatz super stiff wire was coiled within the collection. Appropriate positioning was confirmed with a limited CT scan. The tract was serially dilated allowing placement of a 10 [place] all-purpose drainage catheter. Appropriate positioning was confirmed with a limited postprocedural CT scan. Approximately 10 ml of blood-tinged serous fluid was aspirated. The tube was connected to a JP bulb and sutured in place. A dressing was placed. The patient tolerated the procedure well without immediate post procedural complication. IMPRESSION: Successful CT guided placement of a 10 French all purpose drain catheter into the midline of the lower abdomen/pelvis with aspiration of 10 mL of blood-tinged  serous fluid. Samples were sent to the laboratory as requested by the ordering clinical team. Electronically Signed   By: Sandi Mariscal M.D.   On: 01/11/2020 08:24     Assessment and Recommendation  86 y.o. male with acute diverticulitis with abdominal abscess And Covid pneumonia with episode of atrial flutter with rapid ventricular rate now spontaneously converted to normal sinus rhythm without evidence of myocardial infarction or congestive heart failure 1.  Continuation of current treatment for previous history of paroxysmal nonvalvular atrial fibrillation including amiodarone 2.  Continue anticoagulation as long as patient does not have any significant side effects or bleeding complications to reduce risk of stroke with atrial fibrillation and atrial flutter 3.  Continuation of diltiazem drip for maintenance of normal sinus rhythm but may be able to decrease dosage to 5 mg/h and watch for improvements and/or discontinue if patient is hypotensive 4.  No further cardiac diagnostics necessary at this time 5.  Further treatment of infection and supportive care of that without restriction  Signed, Serafina Royals M.D. FACC

## 2020-01-17 NOTE — Progress Notes (Signed)
Pharmacy Antibiotic Note  Jimmy Moore is a 86 y.o. male admitted on 01/16/2020 with emphysematous cystits and postprocedural intraabdominal abscess. Patient admitted from skilled nursing facility. Patient with recent findings of MRSA and Pseudomonas in urine on 1/3 and treatment for COVID-19 pneumonia from 12/30-1/3. Pharmacy was consulted for Vancomycin dosing. This is day #8 of IV antibiotics, renal function has been stable since the previous note, leukocytosis unresolved  Vancomycin Levels: Peak 01/12 1339: 38.7 mcg/mL Trough 01/13 0903: 12 mcg/mL  Plan: continue vancomycin 2g IV Q24hr  obtain serum creatinine daily to monitor renal function  obtain peaks and troughs as clinically inidicated   Height: 6\' 4"  (193 cm) Weight: 192 lb 14.4 oz (87.5 kg) IBW/kg (Calculated) : 86.8  Temp (24hrs), Avg:98.2 F (36.8 C), Min:97.7 F (36.5 C), Max:98.6 F (37 C)  Recent Labs  Lab 01/12/20 0505 01/12/20 1339 01/13/20 0517 01/13/20 0903 01/14/20 0450 01/15/20 0544 01/16/20 0647 01/17/20 0618  WBC   < >  --  24.4*  --  32.7* 32.7* 15.0* 14.1*  CREATININE   < >  --  0.50*  --  0.55* 0.69 0.67 0.72  VANCOTROUGH  --   --   --  12*  --   --   --   --   VANCOPEAK  --  37  --   --   --   --   --   --    < > = values in this interval not displayed.    Estimated Creatinine Clearance: 78.4 mL/min (by C-G formula based on SCr of 0.72 mg/dL).    No Known Allergies  Antimicrobials this admission: Metronidazole 1/8 x 1 Zosyn 1/8 >> 1/14 Ciprofloxacin 1/14 >> 1/15 Cefepime 1/16 >> Vancomycin 1/8 >>  Microbiology results: 1/8 BCx: no growth final 1/8 UCx: P aeruginosa 1/8 WCx P aeruginosa, MRSA   Thank you for allowing pharmacy to be a part of this patient's care.  Lizmary Nader L 01/17/2020 12:19 PM

## 2020-01-18 LAB — BASIC METABOLIC PANEL
Anion gap: 5 (ref 5–15)
BUN: 28 mg/dL — ABNORMAL HIGH (ref 8–23)
CO2: 24 mmol/L (ref 22–32)
Calcium: 7.4 mg/dL — ABNORMAL LOW (ref 8.9–10.3)
Chloride: 115 mmol/L — ABNORMAL HIGH (ref 98–111)
Creatinine, Ser: 0.64 mg/dL (ref 0.61–1.24)
GFR calc Af Amer: >60 mL/min (ref >60)
GFR calc non Af Amer: >60 mL/min (ref >60)
Glucose, Bld: 140 mg/dL — ABNORMAL HIGH (ref 70–99)
Potassium: 4.3 mmol/L (ref 3.5–5.1)
Sodium: 144 mmol/L (ref 135–145)

## 2020-01-18 LAB — CBC WITH DIFFERENTIAL/PLATELET
Abs Immature Granulocytes: 0.28 10*3/uL — ABNORMAL HIGH (ref 0.00–0.07)
Basophils Absolute: 0 10*3/uL (ref 0.0–0.1)
Basophils Relative: 0 %
Eosinophils Absolute: 0 10*3/uL (ref 0.0–0.5)
Eosinophils Relative: 0 %
HCT: 31.4 % — ABNORMAL LOW (ref 39.0–52.0)
Hemoglobin: 10.3 g/dL — ABNORMAL LOW (ref 13.0–17.0)
Immature Granulocytes: 2 %
Lymphocytes Relative: 3 %
Lymphs Abs: 0.5 10*3/uL — ABNORMAL LOW (ref 0.7–4.0)
MCH: 30.7 pg (ref 26.0–34.0)
MCHC: 32.8 g/dL (ref 30.0–36.0)
MCV: 93.7 fL (ref 80.0–100.0)
Monocytes Absolute: 1.9 10*3/uL — ABNORMAL HIGH (ref 0.1–1.0)
Monocytes Relative: 12 %
Neutro Abs: 12.3 10*3/uL — ABNORMAL HIGH (ref 1.7–7.7)
Neutrophils Relative %: 83 %
Platelets: 134 10*3/uL — ABNORMAL LOW (ref 150–400)
RBC: 3.35 MIL/uL — ABNORMAL LOW (ref 4.22–5.81)
RDW: 14.7 % (ref 11.5–15.5)
WBC: 15 10*3/uL — ABNORMAL HIGH (ref 4.0–10.5)
nRBC: 0 % (ref 0.0–0.2)

## 2020-01-18 LAB — PHOSPHORUS: Phosphorus: 2.6 mg/dL (ref 2.5–4.6)

## 2020-01-18 LAB — GLUCOSE, CAPILLARY: Glucose-Capillary: 136 mg/dL — ABNORMAL HIGH (ref 70–99)

## 2020-01-18 LAB — MAGNESIUM: Magnesium: 2.3 mg/dL (ref 1.7–2.4)

## 2020-01-18 MED ORDER — CHLORHEXIDINE GLUCONATE CLOTH 2 % EX PADS
6.0000 | MEDICATED_PAD | Freq: Every day | CUTANEOUS | Status: DC
  Administered 2020-01-20 (×2): 6 via TOPICAL

## 2020-01-18 NOTE — Progress Notes (Signed)
ID Transferred out of ICU  Patient Vitals for the past 24 hrs:  BP Temp Temp src Pulse Resp SpO2 Weight  01/18/20 1800 - - - 83 - 94 % -  01/18/20 1758 - - - (!) 115 - (!) 57 % -  01/18/20 1647 136/70 98.6 F (37 C) - 64 (!) 24 (!) 88 % -  01/18/20 1500 - - - 66 19 99 % -  01/18/20 1400 121/66 - - 64 18 96 % -  01/18/20 1300 - - - 78 18 96 % -  01/18/20 1200 121/66 98.9 F (37.2 C) Axillary (!) 56 17 95 % -  01/18/20 1100 - - - 77 (!) 24 95 % -  01/18/20 1000 - - - 79 (!) 25 95 % -  01/18/20 0900 - - - 73 (!) 22 (!) 87 % -  01/18/20 0800 135/78 98.6 F (37 C) Axillary 66 20 98 % -  01/18/20 0359 - - - - - - 88 kg  01/18/20 0240 (!) 150/96 - - 82 (!) 24 94 % -  01/17/20 2023 (!) 125/54 (!) 97.4 F (36.3 C) Axillary 73 19 90 % -    CBC Latest Ref Rng & Units 01/18/2020 01/17/2020 01/16/2020  WBC 4.0 - 10.5 K/uL 15.0(H) 14.1(H) 15.0(H)  Hemoglobin 13.0 - 17.0 g/dL 10.3(L) 10.1(L) 10.9(L)  Hematocrit 39.0 - 52.0 % 31.4(L) 30.4(L) 33.2(L)  Platelets 150 - 400 K/uL 134(L) 123(L) 122(L)    CMP Latest Ref Rng & Units 01/18/2020 01/17/2020 01/16/2020  Glucose 70 - 99 mg/dL 140(H) 145(H) 144(H)  BUN 8 - 23 mg/dL 28(H) 28(H) 21  Creatinine 0.61 - 1.24 mg/dL 0.64 0.72 0.67  Sodium 135 - 145 mmol/L 144 141 139  Potassium 3.5 - 5.1 mmol/L 4.3 3.2(L) 3.8  Chloride 98 - 111 mmol/L 115(H) 109 105  CO2 22 - 32 mmol/L 24 26 23   Calcium 8.9 - 10.3 mg/dL 7.4(L) 7.7(L) 7.9(L)  Total Protein 6.5 - 8.1 g/dL - - 5.6(L)  Total Bilirubin 0.3 - 1.2 mg/dL - - 0.9  Alkaline Phos 38 - 126 U/L - - 65  AST 15 - 41 U/L - - 18  ALT 0 - 44 U/L - - 19    Impression/recommendation  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-  Intra-abdominal collection of infected urine/abscess Emphysematous bladder MRSA/Pseudomonas in culture urine and intrabadominal collection Continuing intra abdominal draina dn foley catheter until the fistual closes On IV vanco and cefepime- will need for total of 2 weeks Cipro  was Dc after 1 day because of QT being borderline and DDI  Afib- on diltiazem and amiodarone/eliquis  COVID 19 illness- got remdisivir and now on steroids- it may be prudent to stop decadron because of the infection and fistula and allow for bladder to heal and close   BPH with urinary retention   Will discuss with family regarding antibiotic plan

## 2020-01-18 NOTE — Progress Notes (Addendum)
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 Patient is now growing out Pseudomonas from his urine as well as from the abscess area,sensitive to Zosyn.Patient is also COVID-19 positive with acute hypoxia requiring 4 L/min and has associated tachypnea. He was previously treated for this during prior admission, discharged on 01/01/20. CTA chest obtained 1/14negative forPE.Increased oxygen requirements and tachypnea 1/15, in addition to A-flutter with RVR requiring amiodarone drip for rate control.   Subjective 1/18: Patient seen this morning, awake in bed.  No acute events reported overnight.  Is off Cardizem drip this morning.  Per his nurse has been more confused today.  Does seem confused to me as well, more so than previous days.  Does have his glasses and phone.  He denies pain, fever, chills, nausea or vomiting, chest pain or shortness of breath.  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 --IR recommended keeping the drain at least until 01/16/2020,if not  longer. --IR doesbelieve there is some communication between the bladder and they think it needs to be there longer so that can heal up. No further imagingneededunless his abdominal exam worsens. --antibiotics perinfectious disease --Continue vancomycin and cefepime --DO NOT replace or reposition Foley without contact urology.  Acute Hypoxic Respiratory Failure with hypoxia - in setting of recent Covid PNA Still requiringto 15 L/min HFNC today, this is upfrom 4 L on 1/14. CTA chest 1/14 was negative for PE but showed extensive patchy consolidation and ground-glass opacity in the left greater than right lungs involving all lung lobes, enlarged main pulmonary artery. Patient also with a-flutter RVR. --start IV dexamethasone in case this is post-covid issue --Lasix 40 mg IV twice daily as patient has significantly positive fluid balance --supplement O2 for sat > 90% --wean down O2 as tolerated --HR control as below  Atrial Flutter with RVR-resolved.  Onset 1/15. BP too soft for beta/calcium channel blockersinitially. Not controlled with amio bolus alone. Transferred to stepdownforamiodarone gtt. Likely due to hypoxia.Now off Cardizem drip, rate controlled and maintaining normal sinus rhythm. --cardiology consulted, Dr. Nehemiah Massed --continue Cardizem drip as BP tolerates --maintain HR < 110 --maintain K>4, Mg>2  Chronic A-Fib --continue Eliquis --PO amio on hold while on gtt --continuehome Toprol  History of COVID-19pneumonia --Diagnosed with Covid on 12/30/2019 --s/p remdesivir and steroids discharged 01/01/2020 --persistentgroundglass opacities in bilateral lower lobes and right middle lobe on CT most likelypost-Covid inflammation. --patient without respiratory symptomsinitially, but now with tachypnea and15L/min oxygen requirement.Plan as above.   Marked leukocytosis- improving with antibiotics, but increased again today Improved after  percutaneous drainage 56-->36--26--22--22--24-->32   Coronary disease, secondary prevention Hypertension Chronic diastolic CHF,potentially with decompensation contributing to degree of hypoxia --Euvolemic, blood pressure normal --Continueamlodipine, metoprolol --Hold losartan for now, add back  if blood pressure warrants. --Patient does not appear to be on antiplatelet agent at home. --Daily weights, strict ins and outs --1500 cc fluid restriction  BPH Patient had Foley placed by urology, will need to be in for 3 weeks with outpatient follow-up with urology. --Continuetamsulosin and finasteride  GERD Continuepantoprazole  Seizure disorder Continues Keppra  Dementia without behavioral disturbance Monitor for behavioral disturbance  High risk of malnutrition He has moderate muscle mass and fat loss.  Longer-term plans Per Dr. Sherryl Manges of 01/12/20: "Conversation1/11/21with daughter [name] and her husband [name] who is a Software engineer about long-term plans for care for Mr. Malvern. I noted it could be very difficult to eradicate abscess without surgery and long-term outcomelooks challenging. She states she will discuss the situation with her brother and sisters but at this point to continue with imaging and treatment as we are doing. She rightbring up the possibility of palliative care with them. On rediscussion1/11/2020 with son-in-lawBuddywire I updated him about marked improvement in abdominal exam as well as recommendations of radiology to continue present course and no further recommendations for imaging. He is concerned about patient's nutritional status and not wanting to eat which I concur with. Will place dietary consult for possible supplements. Patient is DNR"   DVT prophylaxis:on Eliquis Code Status: DNR Family Communication:son-in-law updated by phone this evening, all questions answered  Disposition Plan:Pending clinical improvement  and PT evaluation. Urology,general surgeryand IDfollowing. Patient requires continued hospital level caregiven need forIV antibiotics for intraabdominal abscess with possible fistulawith drain in place, in addition topersistentacute hypoxic respiratory failure   Consultants:  Urology  General surgery  Procedures:Placement of abdominal abscess drain percutaneously by IR  Antimicrobials:  Vancomycin- 1/8 to TBD  Zosyn- 1/8-1/14   Cipro - 1/14-1/15  Cefepime - 1/15 to TBD   Objective: Vitals:   01/17/20 1900 01/17/20 2023 01/18/20 0240 01/18/20 0359  BP: (!) 150/112 (!) 125/54 (!) 150/96   Pulse: (!) 36 73 82   Resp: (!) 31 19 (!) 24   Temp:  (!) 97.4 F (36.3 C)    TempSrc:  Axillary    SpO2: 100% 90% 94%   Weight:    88 kg  Height:        Intake/Output Summary (Last 24 hours) at 01/18/2020 0857 Last data filed at 01/18/2020 0400 Gross per 24 hour  Intake 1463.51 ml  Output 525 ml  Net 938.51 ml   Filed Weights   01/13/20 1348 01/17/20 0500 01/18/20 0359  Weight: 90.1 kg 87.5 kg 88 kg    Examination:  General exam: awake, alert, no acute distress, obese Respiratory system: Decreased breath sounds, no wheezes, rales or rhonchi, normal respiratory effort.  On 6.5 L/min oxygen, down from 15. Cardiovascular system: normal S1/S2, RRR, no pedal edema.   Gastrointestinal system: soft, non-tender, non-distended abdomen, JP drain in place, minimal serosanguineous fluid. Central nervous system: alert and oriented x3. no gross focal neurologic deficits, normal speech Extremities: moves all, no cyanosis, normal tone Psychiatry: normal mood, congruent affect, abnormal judgement and insight due to confusion ium    Data Reviewed: I have personally reviewed following labs and imaging studies  CBC: Recent Labs  Lab 01/14/20 0450 01/15/20 0544 01/16/20 0647 01/17/20 0618 01/18/20 0406  WBC 32.7* 32.7* 15.0* 14.1* 15.0*  NEUTROABS 24.4* 25.6*  --   10.8* 12.3*  HGB 11.1* 11.0* 10.9* 10.1* 10.3*  HCT 32.4* 32.7* 33.2* 30.4* 31.4*  MCV 90.8 92.1 93.0 91.8 93.7  PLT 118* 118* 122* 123* 134*   Basic  Metabolic Panel: Recent Labs  Lab 01/14/20 0450 01/15/20 0544 01/16/20 0647 01/17/20 0618 01/18/20 0406  NA 138 139 139 141 144  K 3.3* 4.0 3.8 3.2* 4.3  CL 105 107 105 109 115*  CO2 25 24 23 26 24   GLUCOSE 129* 107* 144* 145* 140*  BUN 11 14 21  28* 28*  CREATININE 0.55* 0.69 0.67 0.72 0.64  CALCIUM 7.8* 7.9* 7.9* 7.7* 7.4*  MG 1.6* 1.7 2.2 2.4 2.3  PHOS  --   --   --   --  2.6   GFR: Estimated Creatinine Clearance: 78.4 mL/min (by C-G formula based on SCr of 0.64 mg/dL). Liver Function Tests: Recent Labs  Lab 01/12/20 0505 01/16/20 0647  AST 16 18  ALT 17 19  ALKPHOS 49 65  BILITOT 1.2 0.9  PROT 5.1* 5.6*  ALBUMIN 1.9* 1.9*   No results for input(s): LIPASE, AMYLASE in the last 168 hours. No results for input(s): AMMONIA in the last 168 hours. Coagulation Profile: No results for input(s): INR, PROTIME in the last 168 hours. Cardiac Enzymes: No results for input(s): CKTOTAL, CKMB, CKMBINDEX, TROPONINI in the last 168 hours. BNP (last 3 results) No results for input(s): PROBNP in the last 8760 hours. HbA1C: No results for input(s): HGBA1C in the last 72 hours. CBG: No results for input(s): GLUCAP in the last 168 hours. Lipid Profile: No results for input(s): CHOL, HDL, LDLCALC, TRIG, CHOLHDL, LDLDIRECT in the last 72 hours. Thyroid Function Tests: No results for input(s): TSH, T4TOTAL, FREET4, T3FREE, THYROIDAB in the last 72 hours. Anemia Panel: No results for input(s): VITAMINB12, FOLATE, FERRITIN, TIBC, IRON, RETICCTPCT in the last 72 hours. Sepsis Labs: Recent Labs  Lab 01/14/20 0450  PROCALCITON 0.31    Recent Results (from the past 240 hour(s))  Aerobic/Anaerobic Culture (surgical/deep wound)     Status: None   Collection Time: 01/13/2020  5:30 PM   Specimen: Abscess  Result Value Ref Range Status    Specimen Description ABSCESS DRAINAGE  Final   Special Requests NONE  Final   Gram Stain   Final    ABUNDANT WBC PRESENT, PREDOMINANTLY PMN NO ORGANISMS SEEN    Culture   Final    FEW PSEUDOMONAS AERUGINOSA FEW METHICILLIN RESISTANT STAPHYLOCOCCUS AUREUS NO ANAEROBES ISOLATED Performed at [facility], [place] [address], [place], [place] [address]    Report Status 01/13/2020 FINAL  Final   Organism ID, Bacteria PSEUDOMONAS AERUGINOSA  Final   Organism ID, Bacteria METHICILLIN RESISTANT STAPHYLOCOCCUS AUREUS  Final      Susceptibility   Methicillin resistant staphylococcus aureus - MIC*    CIPROFLOXACIN >=8 RESISTANT Resistant     ERYTHROMYCIN >=8 RESISTANT Resistant     GENTAMICIN <=0.5 SENSITIVE Sensitive     OXACILLIN >=4 RESISTANT Resistant     TETRACYCLINE <=1 SENSITIVE Sensitive     VANCOMYCIN 1 SENSITIVE Sensitive     TRIMETH/SULFA <=10 SENSITIVE Sensitive     CLINDAMYCIN >=8 RESISTANT Resistant     RIFAMPIN <=0.5 SENSITIVE Sensitive     Inducible Clindamycin NEGATIVE Sensitive     * FEW METHICILLIN RESISTANT STAPHYLOCOCCUS AUREUS   Pseudomonas aeruginosa - MIC*    CEFTAZIDIME 4 SENSITIVE Sensitive     CIPROFLOXACIN <=0.25 SENSITIVE Sensitive     GENTAMICIN <=1 SENSITIVE Sensitive     IMIPENEM >=16 RESISTANT Resistant     PIP/TAZO <=4 SENSITIVE Sensitive     CEFEPIME 2 SENSITIVE Sensitive     * FEW PSEUDOMONAS AERUGINOSA  Radiology Studies: No results found.      Scheduled Meds: . amiodarone  100 mg Oral Daily  . amLODipine  5 mg Oral Daily  . apixaban  2.5 mg Oral BID  . Chlorhexidine Gluconate Cloth  6 each Topical Daily  . dexamethasone (DECADRON) injection  6 mg Intravenous Daily  . feeding supplement (ENSURE ENLIVE)  237 mL Oral TID BM  . finasteride  5 mg Oral Daily  . furosemide  40 mg Intravenous BID  . gabapentin  100 mg Oral TID  . levETIRAcetam  250 mg Oral Daily  . levETIRAcetam  500 mg Oral QPM  . metoprolol succinate  25 mg  Oral Daily  . multivitamin with minerals  1 tablet Oral Daily  . pantoprazole  40 mg Oral Daily  . sodium chloride flush  10-40 mL Intracatheter Q12H  . sodium chloride flush  5 mL Intracatheter Q8H  . tamsulosin  0.4 mg Oral Daily   Continuous Infusions: . sodium chloride 5 mL/hr (01/17/20 1439)  . ceFEPime (MAXIPIME) IV 2 g (01/18/20 [id number])  . diltiazem (CARDIZEM) infusion Stopped (01/17/20 1426)  . vancomycin 2,000 mg (01/18/20 0839)     LOS: 10 days    Time spent: 30 minutes    Ezekiel Slocumb, DO [facility]   If 7PM-7AM, please contact night-coverage [email or web address] 01/18/2020, 8:57 AM

## 2020-01-19 DIAGNOSIS — U071 COVID-19: Secondary | ICD-10-CM

## 2020-01-19 DIAGNOSIS — J9601 Acute respiratory failure with hypoxia: Secondary | ICD-10-CM

## 2020-01-19 DIAGNOSIS — I10 Essential (primary) hypertension: Secondary | ICD-10-CM

## 2020-01-19 DIAGNOSIS — N3080 Other cystitis without hematuria: Secondary | ICD-10-CM

## 2020-01-19 DIAGNOSIS — I5032 Chronic diastolic (congestive) heart failure: Secondary | ICD-10-CM

## 2020-01-19 LAB — CBC WITH DIFFERENTIAL/PLATELET
Abs Immature Granulocytes: 0.62 10*3/uL — ABNORMAL HIGH (ref 0.00–0.07)
Basophils Absolute: 0 10*3/uL (ref 0.0–0.1)
Basophils Relative: 0 %
Eosinophils Absolute: 0 10*3/uL (ref 0.0–0.5)
Eosinophils Relative: 0 %
HCT: 31.5 % — ABNORMAL LOW (ref 39.0–52.0)
Hemoglobin: 10.2 g/dL — ABNORMAL LOW (ref 13.0–17.0)
Immature Granulocytes: 3 %
Lymphocytes Relative: 3 %
Lymphs Abs: 0.6 10*3/uL — ABNORMAL LOW (ref 0.7–4.0)
MCH: 30.8 pg (ref 26.0–34.0)
MCHC: 32.4 g/dL (ref 30.0–36.0)
MCV: 95.2 fL (ref 80.0–100.0)
Monocytes Absolute: 3.4 10*3/uL — ABNORMAL HIGH (ref 0.1–1.0)
Monocytes Relative: 16 %
Neutro Abs: 16.8 10*3/uL — ABNORMAL HIGH (ref 1.7–7.7)
Neutrophils Relative %: 78 %
Platelets: 136 10*3/uL — ABNORMAL LOW (ref 150–400)
RBC: 3.31 MIL/uL — ABNORMAL LOW (ref 4.22–5.81)
RDW: 14.7 % (ref 11.5–15.5)
WBC: 21.5 10*3/uL — ABNORMAL HIGH (ref 4.0–10.5)
nRBC: 0 % (ref 0.0–0.2)

## 2020-01-19 LAB — COMPREHENSIVE METABOLIC PANEL
ALT: 31 U/L (ref 0–44)
AST: 22 U/L (ref 15–41)
Albumin: 1.9 g/dL — ABNORMAL LOW (ref 3.5–5.0)
Alkaline Phosphatase: 65 U/L (ref 38–126)
Anion gap: 8 (ref 5–15)
BUN: 31 mg/dL — ABNORMAL HIGH (ref 8–23)
CO2: 25 mmol/L (ref 22–32)
Calcium: 8.2 mg/dL — ABNORMAL LOW (ref 8.9–10.3)
Chloride: 111 mmol/L (ref 98–111)
Creatinine, Ser: 0.7 mg/dL (ref 0.61–1.24)
GFR calc Af Amer: >60 mL/min (ref >60)
GFR calc non Af Amer: >60 mL/min (ref >60)
Glucose, Bld: 116 mg/dL — ABNORMAL HIGH (ref 70–99)
Potassium: 3.9 mmol/L (ref 3.5–5.1)
Sodium: 144 mmol/L (ref 135–145)
Total Bilirubin: 0.8 mg/dL (ref 0.3–1.2)
Total Protein: 5.2 g/dL — ABNORMAL LOW (ref 6.5–8.1)

## 2020-01-19 LAB — CREATININE, SERUM
Creatinine, Ser: 0.79 mg/dL (ref 0.61–1.24)
GFR calc Af Amer: >60 mL/min (ref >60)
GFR calc non Af Amer: >60 mL/min (ref >60)

## 2020-01-19 LAB — VANCOMYCIN, TROUGH: Vancomycin Tr: 21 ug/mL (ref 15–20)

## 2020-01-19 LAB — MAGNESIUM: Magnesium: 2.3 mg/dL (ref 1.7–2.4)

## 2020-01-19 LAB — LACTIC ACID, PLASMA: Lactic Acid, Venous: 1.4 mmol/L (ref 0.5–1.9)

## 2020-01-19 MED ORDER — HYDROXYZINE HCL 25 MG PO TABS
25.0000 mg | ORAL_TABLET | Freq: Once | ORAL | Status: AC
  Administered 2020-01-19: 01:00:00 25 mg via ORAL
  Filled 2020-01-19: qty 1

## 2020-01-19 MED ORDER — VANCOMYCIN VARIABLE DOSE PER UNSTABLE RENAL FUNCTION (PHARMACIST DOSING): Status: DC

## 2020-01-19 MED ORDER — MELATONIN 5 MG PO TABS
5.0000 mg | ORAL_TABLET | Freq: Every day | ORAL | Status: DC
  Administered 2020-01-19 – 2020-01-20 (×3): 5 mg via ORAL
  Filled 2020-01-19 (×3): qty 1

## 2020-01-19 MED ORDER — GUAIFENESIN-CODEINE 100-10 MG/5ML PO SOLN
10.0000 mL | Freq: Four times a day (QID) | ORAL | Status: DC | PRN
  Administered 2020-01-19: 10 mL via ORAL
  Filled 2020-01-19 (×2): qty 10

## 2020-01-19 NOTE — Progress Notes (Signed)
Nutrition Follow Up Note   DOCUMENTATION CODES:   Not applicable  INTERVENTION:   If family wishes to proceed with full scope of care, recommend NGT placement and tube feeds  Ensure Enlive po TID, each supplement provides 350 kcal and 20 grams of protein  Magic cup TID with meals, each supplement provides 290 kcal and 9 grams of protein  MVI daily   Pt is at high refeed risk   NUTRITION DIAGNOSIS:   Increased nutrient needs related to catabolic illness(COVID 19) as evidenced by increased estimated needs.  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   Pt continues to have very poor appetite and oral intake. Pt drank 1/2 of an Ensure today but refused the rest. Pt has now been without adequate nutrition for >10 days. Recommend palliative care discussion about nutrition. If family wishes to persue full scope of care, recommend NGT placement and tube feeds. Pt is at high refeed risk.   Per chart, pt has remained weight stable since admit.   Medications reviewed and include: lasix, melatonin, MVI, protonix, cefepime, vancomycin   Labs reviewed: K 3.9 wnl, BUN 31(H) Wbc- 21.5(H), Hgb 10.2(L), Hct 31.5(L)  Diet Order:   Diet Order            DIET DYS 3 Room service appropriate? No; Fluid consistency: Thin; Fluid restriction: 1500 mL Fluid  Diet effective now             EDUCATION NEEDS:   Not appropriate for education at this time  Skin:  Skin Assessment: Reviewed RN Assessment(MASD, Stage II buttocks)  Last BM:  1/15- TYPE  4  Height:   Ht Readings from Last 1 Encounters:  01/09/20 '6\' 4"'  (1.93 m)    Weight:   Wt Readings from Last 1 Encounters:  01/18/20 88 kg    Ideal Body Weight:  91.8 kg  BMI:  Body mass index is 23.62 kg/m.  Estimated Nutritional Needs:   Kcal:  2400-2700kcal/day  Protein:  >125g/day  Fluid:  2.4L/day  Koleen Distance MS, RD, LDN Pager #- [phone] Office#- [phone] After Hours Pager: [phone]

## 2020-01-19 NOTE — Progress Notes (Signed)
ID  Antibiotic Vancomycin 1/8 >> Zosyn 1/8-1/14>> Cipro 1/14>>1/15 Cefepime 1/16>>  LDA 01/09/20 JP drain abdomen Foley (coude) 01/14/20 Midline  01/17/20  Pt has resp distress- on 100% NRB Has mittens to prevent him from removing the line and mask As per nurse he has been confused but did talk to his family on the phone    Patient Vitals for the past 24 hrs:  BP Temp Temp src Pulse Resp SpO2  01/19/20 1232 127/78 97.9 F (36.6 C) Oral 83 -- 90 %  01/19/20 1053 -- -- -- -- -- 93 %  01/19/20 0925 (!) 153/61 98 F (36.7 C) Oral (!) 56 18 96 %  01/19/20 0701 (!) 165/139 (!) 97.5 F (36.4 C) Oral 82 20 90 %  01/18/20 2057 (!) 155/81 97.6 F (36.4 C) Oral 73 (!) 22 95 %  01/18/20 1800 -- -- -- 83 -- 94 %  01/18/20 1758 -- -- -- (!) 115 -- (!) 57 %   O/e Lying with eyes closed HS irregular, rate controlled Chest b/l air entry- crepts  B/l abd soft- JP drain Foley catheter clear urine Scalp- left side- area of dry superficial ulceration ? SCC? BCC  CBC Latest Ref Rng & Units 01/19/2020 01/18/2020 01/17/2020  WBC 4.0 - 10.5 K/uL 21.5(H) 15.0(H) 14.1(H)  Hemoglobin 13.0 - 17.0 g/dL 10.2(L) 10.3(L) 10.1(L)  Hematocrit 39.0 - 52.0 % 31.5(L) 31.4(L) 30.4(L)  Platelets 150 - 400 K/uL 136(L) 134(L) 123(L)    CMP Latest Ref Rng & Units 01/19/2020 01/19/2020 01/18/2020  Glucose 70 - 99 mg/dL 116(H) - 140(H)  BUN 8 - 23 mg/dL 31(H) - 28(H)  Creatinine 0.61 - 1.24 mg/dL 0.70 0.79 0.64  Sodium 135 - 145 mmol/L 144 - 144  Potassium 3.5 - 5.1 mmol/L 3.9 - 4.3  Chloride 98 - 111 mmol/L 111 - 115(H)  CO2 22 - 32 mmol/L 25 - 24  Calcium 8.9 - 10.3 mg/dL 8.2(L) - 7.4(L)  Total Protein 6.5 - 8.1 g/dL 5.2(L) - -  Total Bilirubin 0.3 - 1.2 mg/dL 0.8 - -  Alkaline Phos 38 - 126 U/L 65 - -  AST 15 - 41 U/L 22 - -  ALT 0 - 44 U/L 31 - -     Impression/recommendation  Bladder fistula - likely traumatic opening into the abdomen- with intraabdominal collection of urine and abscess  formation Emphysematous cystitis MRSA and pseudomonas infection Has an intra-abdominal drain Continue vanco and cefepime   Leucocytosis - fluctuating   Acute hypoxic resp fistula- CT chest from 1/14 showed extensive GGo on the left compared to right- ? CHF VS COVID pneumonia May have to repeat CXR   Afib- on amiodarone/eliquis  HTN on amlodipine Frusemide   BPH- foley inserted by urology- on flomax and finasteride Clear urine now   Afib  Recent COVID 19 illness 12/30/19- treated with 5 days of IV remdisivir and decadron  Discussed the management with his nurse

## 2020-01-19 NOTE — Consult Note (Signed)
Pulmonary Critical Care  Initial Consult Note  Jimmy Moore [id number] DOB: [birth year] DOA: 01/24/2020  Referring physician: Dr. Arbutus Ped  Chief Complaint: Pulmonary infiltrates  HPI: Jimmy Moore is a 86 y.o. male with a history of recent COVID-19 infection back in December also has a history of dementia coronary artery disease chronic diastolic heart failure hypertension peripheral vascular disease atrial fibrillation on Eliquis he apparently was in the hospital from late December 30 to January 3 with COVID-19 pneumonia.  Patient had a rough course with increasing hypoxia and he came back to the hospital now because of hematuria and also abdominal pain.  In the ED patient was noted to be significantly hypoxic and platelet count of 56,000 along with a hemoglobin of 11.  CT of the abdomen was done which showed abdominal abscess with an inflamed bladder.  Patient has been having issues with oxygenation and chest CT that was done shows extensive left-sided consolidation which could be consistent with post COVID-19 pneumonia interestingly enough the right side is actually improved.  In addition to that there was near total resolution of the peritoneal cavity fluid collection.  Concern is that the patient is still hypoxic and still has infiltrates.  Review of Systems:  12 point review of system was performed patient is confused not able to participate Past Medical History:  Diagnosis Date  . Atherosclerotic heart disease of native coronary artery without angina pectoris   . Benign prostatic hyperplasia without lower urinary tract symptoms   . Chronic diastolic (congestive) heart failure ([place])   . Hyperlipidemia   . Hypertensive heart disease with heart failure ([place])   . Peripheral vascular disease ([place])   . Unspecified atrial fibrillation ([place])   . Unspecified dementia without behavioral disturbance ([place])    History reviewed. No pertinent surgical history. Social History:  reports that he has  quit smoking. He has never used smokeless tobacco. He reports previous alcohol use. He reports that he does not use drugs.  No Known Allergies  History reviewed. No pertinent family history.  Prior to Admission medications   Medication Sig Start Date End Date Taking? Authorizing Provider  acetaminophen (TYLENOL) 325 MG tablet Take 650 mg by mouth every 4 (four) hours as needed for mild pain or fever.   Yes [provider]  albuterol (VENTOLIN HFA) 108 (90 Base) MCG/ACT inhaler Inhale 1 puff into the lungs every 12 (twelve) hours as needed for wheezing or shortness of breath.   Yes [provider]  amiodarone (PACERONE) 100 MG tablet Take 100 mg by mouth daily.   Yes [provider]  amLODipine (NORVASC) 5 MG tablet Take 5 mg by mouth daily.   Yes [provider]  apixaban (ELIQUIS) 2.5 MG TABS tablet Take 2.5 mg by mouth 2 (two) times daily.   Yes [provider]  dextromethorphan-guaiFENesin (TUSSIN DM) 10-100 MG/5ML liquid Take 10 mLs by mouth every 4 (four) hours as needed for cough.   Yes [provider]  feeding supplement, ENSURE ENLIVE, (ENSURE ENLIVE) LIQD Take 237 mLs by mouth 3 (three) times daily between meals. 01/03/20  Yes Danford, Suann Larry, MD  finasteride (PROSCAR) 5 MG tablet Take 1 tablet (5 mg total) by mouth daily. 01/03/20  Yes Danford, Suann Larry, MD  gabapentin (NEURONTIN) 100 MG capsule Take 100 mg by mouth 3 (three) times daily.   Yes [provider]  levETIRAcetam (KEPPRA) 250 MG tablet Take 500 mg by mouth every evening.   Yes [provider]  levETIRAcetam (KEPPRA)  250 MG tablet Take 250 mg by mouth daily.   Yes [provider]  losartan (COZAAR) 100 MG tablet Take 100 mg by mouth daily.   Yes [provider]  metoprolol succinate (TOPROL-XL) 25 MG 24 hr tablet Take 25 mg by mouth daily.   Yes [provider]  omeprazole (PRILOSEC) 20 MG capsule Take 20 mg by mouth at  bedtime.   Yes [provider]  polyethylene glycol (MIRALAX / GLYCOLAX) 17 g packet Take 17 g by mouth every other day.   Yes [provider]  tamsulosin (FLOMAX) 0.4 MG CAPS capsule Take 0.4 mg by mouth daily.   Yes [provider]  traMADol (ULTRAM) 50 MG tablet Take 1 tablet (50 mg total) by mouth every 6 (six) hours as needed for moderate pain. 01/03/20  Yes Edwin Dada, MD   Physical Exam: Vitals:   01/19/20 0925 01/19/20 1053 01/19/20 1232 01/19/20 2047  BP: (!) 153/61  127/78 129/78  Pulse: (!) 56  83 95  Resp: 18   (!) 26  Temp: 98 F (36.7 C)  97.9 F (36.6 C) 97.6 F (36.4 C)  TempSrc: Oral  Oral Oral  SpO2: 96% 93% 90% (!) 86%  Weight:      Height:        Wt Readings from Last 3 Encounters:  01/18/20 88 kg  01/03/20 87.9 kg    General:  Appears calm and comfortable Eyes: PERRL, normal lids, irises & conjunctiva ENT: grossly normal hearing, lips & tongue Neck: no LAD, masses or thyromegaly Cardiovascular: RRR, no m/r/g. No LE edema. Respiratory: CTA bilaterally, no w/r/r.       Normal respiratory effort. Abdomen: soft, nontender Skin: no rash or induration seen on limited exam Musculoskeletal: grossly normal tone BUE/BLE Psychiatric: grossly normal mood and affect Neurologic: grossly non-focal.          Labs on Admission:  Basic Metabolic Panel: Recent Labs  Lab 01/15/20 0544 01/15/20 0544 01/16/20 0647 01/17/20 0618 01/18/20 0406 01/19/20 0549 01/19/20 0909  NA 139  --  139 141 144  --  144  K 4.0  --  3.8 3.2* 4.3  --  3.9  CL 107  --  105 109 115*  --  111  CO2 24  --  23 26 24   --  25  GLUCOSE 107*  --  144* 145* 140*  --  116*  BUN 14  --  21 28* 28*  --  31*  CREATININE 0.69   < > 0.67 0.72 0.64 0.79 0.70  CALCIUM 7.9*  --  7.9* 7.7* 7.4*  --  8.2*  MG 1.7  --  2.2 2.4 2.3  --  2.3  PHOS  --   --   --   --  2.6  --   --    < > = values in this interval not displayed.   Liver Function Tests: Recent  Labs  Lab 01/16/20 0647 01/19/20 0909  AST 18 22  ALT 19 31  ALKPHOS 65 65  BILITOT 0.9 0.8  PROT 5.6* 5.2*  ALBUMIN 1.9* 1.9*   No results for input(s): LIPASE, AMYLASE in the last 168 hours. No results for input(s): AMMONIA in the last 168 hours. CBC: Recent Labs  Lab 01/14/20 0450 01/14/20 0450 01/15/20 0544 01/16/20 0647 01/17/20 0618 01/18/20 0406 01/19/20 0909  WBC 32.7*   < > 32.7* 15.0* 14.1* 15.0* 21.5*  NEUTROABS 24.4*  --  25.6*  --  10.8*  12.3* 16.8*  HGB 11.1*   < > 11.0* 10.9* 10.1* 10.3* 10.2*  HCT 32.4*   < > 32.7* 33.2* 30.4* 31.4* 31.5*  MCV 90.8   < > 92.1 93.0 91.8 93.7 95.2  PLT 118*   < > 118* 122* 123* 134* 136*   < > = values in this interval not displayed.   Cardiac Enzymes: No results for input(s): CKTOTAL, CKMB, CKMBINDEX, TROPONINI in the last 168 hours.  BNP (last 3 results) No results for input(s): BNP in the last 8760 hours.  ProBNP (last 3 results) No results for input(s): PROBNP in the last 8760 hours.  CBG: Recent Labs  Lab 01/15/20 1716  GLUCAP 136*    Radiological Exams on Admission: No results found.  EKG: Independently reviewed.  Assessment/Plan Principal Problem:   Postprocedural intraabdominal abscess Active Problems:   Hypertensive heart disease with heart failure (HCC)   Unspecified atrial fibrillation (HCC)   Unspecified dementia without behavioral disturbance (HCC)   Chronic diastolic (congestive) heart failure (HCC)   Emphysematous cystitis   History of COVID-19   Atrial flutter with rapid ventricular response ([place])   1. Acute on chronic respiratory failure with hypoxia patient has significant infiltrate noted on the CT scan of the chest in addition patient cultured MRSA and Pseudomonas in the abscess.  My concern would be underlying bacterial superinfection.  Less likely that this is active COVID-19 infection this far into his illness.  He is however still on isolation at this time.  Agree with the  antibiotic choice which should be continued and may have a role for increasing the steroids also as discussed.  Patient has been on amiodarone would recommend discontinuing that is it is unclear whether that may be playing a role also.  Currently is on anticoagulation and certainly the findings could also be consistent with atypical pulmonary hemorrhage will need to monitor closely if signs of further drop in the hemoglobin would consider discontinuing the Eliquis also. 2. COVID-19 virus infection in resolution phase at this point unlikely that this is an active problem right now 3. Chronic atrial fibrillation patient is rate controlled right now is on anticoagulation as already mentioned we should monitor closely for any further bleeding problems. 4. Chronic diastolic heart failure recommendations per cardiology  Code Status: DNR  Family Communication: No family present Disposition Plan: Home  Time spent: 70 minutes  I have personally obtained a history, examined the patient, evaluated laboratory and imaging results, formulated the assessment and plan and placed orders.  The Patient requires high complexity decision making for assessment and support. Total Time Spent 82min   Alton Tremblay A Lash Matulich, MD [facility] Pulmonary Critical Care Medicine Sleep Medicine

## 2020-01-19 NOTE — Progress Notes (Signed)
I have reviewed the chart and radiological studies.  Full note to follow  Imp 1. Covid 19 pneumonia in 12/20 with residual changes noted on the last CT in January 14, 2020.  The chest x-ray from December 2020 had revealed presence of right lung infiltrates.  Current CT reveals changes on the left side.  Could represent new bacterial pneumonia.  Unlikely to be recurrence of Covid however cannot be ruled out.  Less likely atypical pulmonary edema.  Patient is also on amiodarone if possible with discontinue this.  Agree with cefepime.In addition we will increase the patient's steroid doses for now as some patients have ongoing post Covid pneumonia inflammatory process steroids may still help.  We will make further recommendations

## 2020-01-19 NOTE — Plan of Care (Signed)
Patient oxygen desaturation of 80s on highflow oxygen.  Respiratory consulted and 15Lhigh flow with nonrebreather initiated.  Oxygenation 90-92%. Patient confused pulling at oxygen and tubes. Mitts bilaterally on and has reduced pulling of lines.  Family [name] and Max) updated and was able to speak with patient which helped to calm patient. MD notified of increased oxygen need.

## 2020-01-19 NOTE — Plan of Care (Signed)
Patient has been restless, fidgeting and pulling off Non-rebreather. NP notified and she ordered Atarax and Melatonin. Atarax and Melatonin administered and now patient is resting quietly in bed with eyes closed. Will continue to monitor.

## 2020-01-19 NOTE — Progress Notes (Signed)
Pharmacy Antibiotic Note  Jimmy Moore is a 86 y.o. male admitted on 01/01/2020 with emphysematous cystits and postprocedural intraabdominal abscess. Patient admitted from skilled nursing facility. Patient with recent findings of MRSA and Pseudomonas in urine on 1/3 and treatment for COVID-19 pneumonia from 12/30-1/3. Pharmacy was consulted for Vancomycin dosing. This is day #11 of IV antibiotics, SCr has been trending up slightly since the previous note therefore a vancomycin trough was drawn this am, leukocytosis has improved  Vancomycin Levels: Peak 01/12 1339: 38.7 mcg/mL Trough 01/13 0903: 12 mcg/mL Trough 01/19 0909 21 mcg/mL  Kinetics Based on Latest Trough Value: T1/2: 17.8h Ke: 0.039 h-1  Plan: Hold vancomycin for today  Obtain a random vancomycin level with tomorrow am labs  SCr in am  New vancomycin regimen will depend on tomorrow morning's vancomycin level   Height: 6\' 4"  (193 cm) Weight: 194 lb 0.1 oz (88 kg) IBW/kg (Calculated) : 86.8  Temp (24hrs), Avg:98.1 F (36.7 C), Min:97.5 F (36.4 C), Max:98.9 F (37.2 C)  Recent Labs  Lab 01/12/20 1339 01/13/20 0517 01/13/20 0903 01/14/20 0450 01/14/20 0450 01/15/20 0544 01/15/20 0544 01/16/20 0647 01/17/20 0618 01/18/20 0406 01/19/20 0549 01/19/20 0909  WBC  --    < >  --  32.7*  --  32.7*  --  15.0* 14.1* 15.0*  --   --   CREATININE  --    < >  --  0.55*   < > 0.69   < > 0.67 0.72 0.64 0.79 0.70  LATICACIDVEN  --   --   --   --   --   --   --   --   --   --   --  1.4  VANCOTROUGH  --   --  12*  --   --   --   --   --   --   --   --  21*  VANCOPEAK 37  --   --   --   --   --   --   --   --   --   --   --    < > = values in this interval not displayed.    Estimated Creatinine Clearance: 78.4 mL/min (by C-G formula based on SCr of 0.7 mg/dL).    No Known Allergies  Antimicrobials this admission: Metronidazole 1/8 x 1 Zosyn 1/8 >> 1/14 Ciprofloxacin 1/14 >> 1/15 Cefepime 1/16 >> Vancomycin 1/8  >>  Microbiology results: 1/8 BCx: no growth final 1/8 UCx: P aeruginosa 1/8 WCx P aeruginosa, MRSA   Thank you for allowing pharmacy to be a part of this patient's care.  Dallie Piles 01/19/2020 9:53 AM

## 2020-01-19 NOTE — Progress Notes (Addendum)
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 Patient is now growing out Pseudomonas from his urine as well as from the abscess area,sensitive to Zosyn.Patient is also COVID-19 positive with acute hypoxia requiring 4 L/min and has associated tachypnea. He was previously treated for this during prior admission, discharged on 01/01/20. CTA chest obtained 1/14negative forPE.Increased oxygen requirements and tachypnea 1/15, in addition to A-flutter with RVR requiring amiodarone drip for rate control.   Subjective 1/19: Patient seen this AM, sleeping comfortably but awakes to voice.  Says he feels find today, just tired.  Denies any pain, fever, chills, SOB, or other acute complaints.  Transferred out of stepdown yesterday.   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 --Urology, general surgery, IDfollowing. --IR recommended keeping the drain at least until 01/16/2020,if not longer. --IR doesbelieve there is some communication  between the bladder and they think it needs to be there longer so that can heal up. No further imagingneededunless his abdominal exam worsens. --antibiotics perinfectious disease: --Vancomycin and Cefepime --DO NOT replace or reposition Foley without contact urology.  Acute Hypoxic Respiratory Failure with hypoxia - in setting of recent Covid PNA Still requiringto 15 L/min HFNC today, this is upfrom 4 L on 1/14. CTA chest 1/14 was negative for PE but showed extensive patchy consolidation and ground-glass opacity in the left greater than right lungs involving all lung lobes, enlarged main pulmonary artery.  --stopped Decadron (as may impede fistula healing) --continue Lasix 40 mg IV twice daily, diuresing fairly well, consider increasing if BP tolerates --supplement O2 for sat > 90% --wean down O2 as tolerated --HR control as below --consulted Pulmonology --positive Covid-19 test was 12/30, can come off isolation after 1/20 (21 days)  Nutrition - inadequate oral intake Patient with very poor appetite and oral intake, has been without adequate intake for >10 days --discuss goals of care with family  --if continuing full scope of care, needs nasogastric tube and tube feeds for nutrition --dietician following   Atrial Flutter with RVR-resolved.  Onset 1/15.  Initially on amiodarone gtt, transitioned to Cardizem gtt once BP would tolerate, had much HR control.  Converted to NSR.  --cardiology consulted, Dr. Nehemiah Moore --continueCardizem drip as BP tolerates --maintain HR < 110 --maintain K>4, Mg>2  Chronic A-Fib --continue home Eliquis & amiodarone --continuehome Toprol  History of COVID-19pneumonia --Diagnosed with Covid on 12/30/2019 --s/p remdesivir and steroids discharged 01/01/2020 --persistentgroundglass opacities in bilateral lower lobes and right middle lobe on CT most likelypost-Covid inflammation. --patient without respiratory symptomsinitially, but now  with tachypnea and15L/min oxygen requirement.Plan as above.   Marked leukocytosis- improving with antibiotics, but increased again today Improved after percutaneous drainage 56-->36--26--22--22--24-->32   Coronary  disease, secondary prevention Hypertension Chronic diastolic CHF,potentially with decompensation contributing to degree of hypoxia --Euvolemic, blood pressure normal --Continueamlodipine, metoprolol --Hold losartan for now, add back if blood pressure warrants. --Patient does not appear to be on antiplatelet agent at home. --Daily weights, strict ins and outs --1500 cc fluid restriction  BPH Patient had Foley placed by urology, will need to be in for 3 weeks with outpatient follow-up with urology. --Continuetamsulosin and finasteride  GERD Continuepantoprazole  Seizure disorder Continues Keppra  Dementia without behavioral disturbance Monitor for behavioral disturbance  High risk of malnutrition He has moderate muscle mass and fat loss.  Longer-term plans Per Dr. Sherryl Moore of 01/12/20: "Conversation1/11/21with daughter Jimmy Moore and her husband Jimmy Moore who is a Software engineer about long-term plans for care for Jimmy Moore. I noted it could be very difficult to eradicate abscess without surgery and long-term outcomelooks challenging. She states she will discuss the situation with her brother and sisters but at this point to continue with imaging and treatment as we are doing. She rightbring up the possibility of palliative care with them. On rediscussion1/11/2020 with son-in-lawBuddywire I updated him about marked improvement in abdominal exam as well as recommendations of radiology to continue present course and no further recommendations for imaging. He is concerned about patient's nutritional status and not wanting to eat which I concur with. Will place dietary consult for possible supplements. Patient is DNR"   DVT prophylaxis:on  Eliquis Code Status: DNR Family Communication:none at bedside  Disposition Plan:Pending clinical improvement and PT evaluation. Urology,general surgeryand IDfollowing. Patient requires continued hospital level caregiven need forIV antibiotics for intraabdominal abscess with possible fistulawith drain in place, in addition topersistentacute hypoxic respiratory failure   Consultants:  Urology  General surgery  Procedures:Placement of abdominal abscess drain percutaneously by IR  Antimicrobials:  Vancomycin- 1/8 to TBD  Zosyn- 1/8-1/14   Cipro - 1/14-1/15  Cefepime - 1/15 to TBD   Objective: Vitals:   01/19/20 0701 01/19/20 0925 01/19/20 1053 01/19/20 1232  BP: (!) 165/139 (!) 153/61  127/78  Pulse: 82 (!) 56  83  Resp: 20 18    Temp: (!) 97.5 F (36.4 C) 98 F (36.7 C)  97.9 F (36.6 C)  TempSrc: Oral Oral  Oral  SpO2: 90% 96% 93% 90%  Weight:      Height:        Intake/Output Summary (Last 24 hours) at 01/19/2020 1609 Last data filed at 01/19/2020 0511 Gross per 24 hour  Intake 427 ml  Output 860 ml  Net -433 ml   Filed Weights   01/13/20 1348 01/17/20 0500 01/18/20 0359  Weight: 90.1 kg 87.5 kg 88 kg    Examination:  General exam: sleeping comfortably, awakes to voice, no acute distress, obese HEENT: moist mucus membranes, hearing grossly normal  Respiratory system: clear to auscultation anteriorly, no wheezes, rales or rhonchi, normal respiratory effort.  On 15 L/min NRB + . Cardiovascular system: normal S1/S2, RRR, no JVD, murmurs, rubs, gallops, trace lower extremity edema.   Gastrointestinal system: soft, non-tender, non-distended abdomen, drain in place. Central nervous system: no gross focal neurologic deficits, normal speech Extremities: moves all, no cyanosis, normal tone Psychiatry: normal mood, congruent affect, judgement and insight appear normal    Data Reviewed: I have personally reviewed following labs and  imaging studies  CBC: Recent Labs  Lab 01/14/20 0450 01/14/20 0450 01/15/20 0544 01/16/20 0647 01/17/20 0618 01/18/20 0406 01/19/20 0909  WBC 32.7*   < > 32.7* 15.0* 14.1* 15.0* 21.5*  NEUTROABS 24.4*  --  25.6*  --  10.8* 12.3* 16.8*  HGB 11.1*   < > 11.0* 10.9* 10.1* 10.3* 10.2*  HCT 32.4*   < > 32.7* 33.2* 30.4* 31.4* 31.5*  MCV 90.8   < > 92.1 93.0 91.8 93.7 95.2  PLT 118*   < > 118* 122* 123* 134* 136*   < > = values in this interval not displayed.   Basic Metabolic Panel: Recent Labs  Lab 01/15/20 0544 01/15/20 0544 01/16/20 0647 01/17/20 0618 01/18/20 0406 01/19/20 0549 01/19/20 0909  NA 139  --  139 141 144  --  144  K 4.0  --  3.8 3.2* 4.3  --  3.9  CL 107  --  105 109 115*  --  111  CO2 24  --  23 26 24   --  25  GLUCOSE 107*  --  144* 145* 140*  --  116*  BUN 14  --  21 28* 28*  --  31*  CREATININE 0.69   < > 0.67 0.72 0.64 0.79 0.70  CALCIUM 7.9*  --  7.9* 7.7* 7.4*  --  8.2*  MG 1.7  --  2.2 2.4 2.3  --  2.3  PHOS  --   --   --   --  2.6  --   --    < > = values in this interval not displayed.   GFR: Estimated Creatinine Clearance: 78.4 mL/min (by C-G formula based on SCr of 0.7 mg/dL). Liver Function Tests: Recent Labs  Lab 01/16/20 0647 01/19/20 0909  AST 18 22  ALT 19 31  ALKPHOS 65 65  BILITOT 0.9 0.8  PROT 5.6* 5.2*  ALBUMIN 1.9* 1.9*   No results for input(s): LIPASE, AMYLASE in the last 168 hours. No results for input(s): AMMONIA in the last 168 hours. Coagulation Profile: No results for input(s): INR, PROTIME in the last 168 hours. Cardiac Enzymes: No results for input(s): CKTOTAL, CKMB, CKMBINDEX, TROPONINI in the last 168 hours. BNP (last 3 results) No results for input(s): PROBNP in the last 8760 hours. HbA1C: No results for input(s): HGBA1C in the last 72 hours. CBG: Recent Labs  Lab 01/15/20 1716  GLUCAP 136*   Lipid Profile: No results for input(s): CHOL, HDL, LDLCALC, TRIG, CHOLHDL, LDLDIRECT in the last 72  hours. Thyroid Function Tests: No results for input(s): TSH, T4TOTAL, FREET4, T3FREE, THYROIDAB in the last 72 hours. Anemia Panel: No results for input(s): VITAMINB12, FOLATE, FERRITIN, TIBC, IRON, RETICCTPCT in the last 72 hours. Sepsis Labs: Recent Labs  Lab 01/14/20 0450 01/19/20 0909  PROCALCITON 0.31  --   LATICACIDVEN  --  1.4    No results found for this or any previous visit (from the past 240 hour(s)).       Radiology Studies: No results found.      Scheduled Meds: . amiodarone  100 mg Oral Daily  . amLODipine  5 mg Oral Daily  . apixaban  2.5 mg Oral BID  . Chlorhexidine Gluconate Cloth  6 each Topical Daily  . feeding supplement (ENSURE ENLIVE)  237 mL Oral TID BM  . finasteride  5 mg Oral Daily  . furosemide  40 mg Intravenous BID  . gabapentin  100 mg Oral TID  . levETIRAcetam  250 mg Oral Daily  . levETIRAcetam  500 mg Oral QPM  . Melatonin  5 mg Oral QHS  . metoprolol succinate  25 mg Oral Daily  . multivitamin with minerals  1 tablet Oral Daily  .  pantoprazole  40 mg Oral Daily  . sodium chloride flush  10-40 mL Intracatheter Q12H  . sodium chloride flush  5 mL Intracatheter Q8H  . tamsulosin  0.4 mg Oral Daily  . vancomycin variable dose per unstable renal function (pharmacist dosing)   Does not apply See admin instructions   Continuous Infusions: . sodium chloride 5 mL/hr (01/17/20 1439)  . ceFEPime (MAXIPIME) IV 2 g (01/19/20 1035)     LOS: 11 days    Time spent: 30 minutes    Ezekiel Slocumb, DO [facility]   If 7PM-7AM, please contact night-coverage [email or web address] 01/19/2020, 4:09 PM

## 2020-01-20 DIAGNOSIS — I4892 Unspecified atrial flutter: Secondary | ICD-10-CM

## 2020-01-20 DIAGNOSIS — I4510 Unspecified right bundle-branch block: Secondary | ICD-10-CM

## 2020-01-20 LAB — COMPREHENSIVE METABOLIC PANEL
ALT: 26 U/L (ref 0–44)
AST: 18 U/L (ref 15–41)
Albumin: 2 g/dL — ABNORMAL LOW (ref 3.5–5.0)
Alkaline Phosphatase: 63 U/L (ref 38–126)
Anion gap: 7 (ref 5–15)
BUN: 26 mg/dL — ABNORMAL HIGH (ref 8–23)
CO2: 29 mmol/L (ref 22–32)
Calcium: 8 mg/dL — ABNORMAL LOW (ref 8.9–10.3)
Chloride: 110 mmol/L (ref 98–111)
Creatinine, Ser: 0.73 mg/dL (ref 0.61–1.24)
GFR calc Af Amer: >60 mL/min (ref >60)
GFR calc non Af Amer: >60 mL/min (ref >60)
Glucose, Bld: 97 mg/dL (ref 70–99)
Potassium: 3.2 mmol/L — ABNORMAL LOW (ref 3.5–5.1)
Sodium: 146 mmol/L — ABNORMAL HIGH (ref 135–145)
Total Bilirubin: 1.1 mg/dL (ref 0.3–1.2)
Total Protein: 5.4 g/dL — ABNORMAL LOW (ref 6.5–8.1)

## 2020-01-20 LAB — CBC WITH DIFFERENTIAL/PLATELET
Abs Immature Granulocytes: 0.78 10*3/uL — ABNORMAL HIGH (ref 0.00–0.07)
Basophils Absolute: 0 10*3/uL (ref 0.0–0.1)
Basophils Relative: 0 %
Eosinophils Absolute: 0 10*3/uL (ref 0.0–0.5)
Eosinophils Relative: 0 %
HCT: 30.4 % — ABNORMAL LOW (ref 39.0–52.0)
Hemoglobin: 9.9 g/dL — ABNORMAL LOW (ref 13.0–17.0)
Immature Granulocytes: 4 %
Lymphocytes Relative: 4 %
Lymphs Abs: 0.8 10*3/uL (ref 0.7–4.0)
MCH: 30.4 pg (ref 26.0–34.0)
MCHC: 32.6 g/dL (ref 30.0–36.0)
MCV: 93.3 fL (ref 80.0–100.0)
Monocytes Absolute: 2.9 10*3/uL — ABNORMAL HIGH (ref 0.1–1.0)
Monocytes Relative: 15 %
Neutro Abs: 14.9 10*3/uL — ABNORMAL HIGH (ref 1.7–7.7)
Neutrophils Relative %: 77 %
Platelets: 144 10*3/uL — ABNORMAL LOW (ref 150–400)
RBC: 3.26 MIL/uL — ABNORMAL LOW (ref 4.22–5.81)
RDW: 14.7 % (ref 11.5–15.5)
WBC: 19.5 10*3/uL — ABNORMAL HIGH (ref 4.0–10.5)
nRBC: 0 % (ref 0.0–0.2)

## 2020-01-20 LAB — IRON AND TIBC
Iron: 8 ug/dL — ABNORMAL LOW (ref 45–182)
Saturation Ratios: 5 % — ABNORMAL LOW (ref 17.9–39.5)
TIBC: 153 ug/dL — ABNORMAL LOW (ref 250–450)
UIBC: 145 ug/dL

## 2020-01-20 LAB — RETICULOCYTES
Immature Retic Fract: 21.5 % — ABNORMAL HIGH (ref 2.3–15.9)
RBC.: 3.33 MIL/uL — ABNORMAL LOW (ref 4.22–5.81)
Retic Count, Absolute: 83.6 10*3/uL (ref 19.0–186.0)
Retic Ct Pct: 2.5 % (ref 0.4–3.1)

## 2020-01-20 LAB — VANCOMYCIN, RANDOM: Vancomycin Rm: 13

## 2020-01-20 LAB — MAGNESIUM: Magnesium: 2 mg/dL (ref 1.7–2.4)

## 2020-01-20 LAB — PROCALCITONIN: Procalcitonin: 0.1 ng/mL

## 2020-01-20 LAB — FOLATE: Folate: 6.2 ng/mL (ref >5.9)

## 2020-01-20 LAB — FERRITIN: Ferritin: 364 ng/mL — ABNORMAL HIGH (ref 24–336)

## 2020-01-20 LAB — VITAMIN B12: Vitamin B-12: 738 pg/mL (ref 180–914)

## 2020-01-20 LAB — GLUCOSE, CAPILLARY: Glucose-Capillary: 90 mg/dL (ref 70–99)

## 2020-01-20 MED ORDER — HALOPERIDOL LACTATE 5 MG/ML IJ SOLN
5.0000 mg | Freq: Once | INTRAMUSCULAR | Status: AC
  Administered 2020-01-20: 5 mg via INTRAVENOUS
  Filled 2020-01-20: qty 1

## 2020-01-20 MED ORDER — SODIUM CHLORIDE 0.9 % IV SOLN
200.0000 mg | Freq: Once | INTRAVENOUS | Status: AC
  Administered 2020-01-20: 18:00:00 200 mg via INTRAVENOUS
  Filled 2020-01-20: qty 10

## 2020-01-20 MED ORDER — METHYLPREDNISOLONE SODIUM SUCC 40 MG IJ SOLR
40.0000 mg | Freq: Two times a day (BID) | INTRAMUSCULAR | Status: DC
  Administered 2020-01-20 – 2020-01-21 (×3): 40 mg via INTRAVENOUS
  Filled 2020-01-20 (×3): qty 1

## 2020-01-20 MED ORDER — VANCOMYCIN HCL 1250 MG/250ML IV SOLN
1250.0000 mg | INTRAVENOUS | Status: DC
  Administered 2020-01-20 – 2020-01-21 (×2): 1250 mg via INTRAVENOUS
  Filled 2020-01-20 (×2): qty 250

## 2020-01-20 MED ORDER — DEXTROSE 5 % IV SOLN: INTRAVENOUS | Status: DC

## 2020-01-20 MED ORDER — POTASSIUM CHLORIDE CRYS ER 20 MEQ PO TBCR
40.0000 meq | EXTENDED_RELEASE_TABLET | ORAL | Status: AC
  Administered 2020-01-20 (×2): 40 meq via ORAL
  Filled 2020-01-20 (×2): qty 2

## 2020-01-20 MED ORDER — LORAZEPAM 2 MG/ML IJ SOLN
1.0000 mg | INTRAMUSCULAR | Status: DC | PRN
  Administered 2020-01-20 – 2020-01-21 (×4): 1 mg via INTRAVENOUS
  Filled 2020-01-20 (×4): qty 1

## 2020-01-20 NOTE — Progress Notes (Addendum)
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 Patient is now growing out Pseudomonas from his urine as well as from the abscess area,sensitive to Zosyn.Patient is also COVID-19 positive with acute hypoxia requiring 4 L/min and has associated tachypnea. He was previously treated for this during prior admission, discharged on 01/01/20. CTA chest obtained 1/14negative forPE.Increased oxygen requirements and tachypnea 1/15, in addition to A-flutter with RVR requiring amiodarone drip for rate control.  Subjective 1/20: Patient sleeping this AM, but awoke to voice.  Falls back to sleep more easily today.  Has nasal cannula and non-rebreather mask.  He asked for water, gave him sips, no sign of aspiration seen.  He denies pain, fever/chills, N/V, SOB or CP.  No acute events reported.  Assessment & Plan:   Principal Problem:   Postprocedural intraabdominal abscess Active Problems:   Atrial flutter with rapid ventricular response (HCC)   Hypertensive heart disease with heart failure (HCC)   Unspecified atrial fibrillation (HCC)   Unspecified dementia without behavioral disturbance (HCC)   Chronic diastolic (congestive) heart failure (HCC)   Emphysematous cystitis   History of COVID-19   Abdominal Pain secondary to below:resolved 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 at another hospital Emphysematous cystitis with possible prostate abscess Cultures growing MRSA and Pseudomonas. --Urology and IDfollowing. --per urology, keep drain and Foley for 4 weeks until follow up with  patient's urologist or Dr. Diamantina Providence and have repeat imaging done outpatient. --Vancomycin and Cefepime --per ID, total 2 weeks, no further antibiotics needed with drain/ foley  --DO NOT replace or reposition Foley without contact urology.  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 Still requiringto 15 L/min HFNC, this is upfrom 4 L on 1/14. CTA chest 1/14 was negative for PE but showed extensive patchy consolidation and ground-glass opacity in the left greater than right lungs involving all lung lobes, enlarged main pulmonary artery.  --consulted Pulmonology, Dr. Humphrey Rolls  --will resume steroids as this is more than likely post-Covid + ?amio induced fibrosis --Solu-medrol 40 mg IV BID --per Pulm, consider stopping amiodarone, if feasible. I have stopped for now, and will get cards' input regarding this in case of need for another antiarrhythmic agent --stopping Lasix as fluid balance has greatly improved.  No improvement in hypoxia with diuresis unfortunately. --supplement O2 for sat > 90%, wean down as tolerated --positive Covid-19 test was 12/30, can come off isolation after 1/20 (21 days)  Nutrition - inadequate oral intake  Patient with very poor appetite and oral intake, has been without adequate intake for >10 days --discuss goals of care with family (on 1/20 family stated want to visit and talk to patient about goals of care once off covid isolation 1/21). --if continuing full scope of care, needs NG or PEG with tube feeds for nutrition --dietician following --family to discuss with patient --palliative care consulted to assist in establishing goals of care  Hypokalemia - replacedfor K 3.2 --monitor BMP, maintain K>4.0 --follow Mg as well, maintain Mg>2.0  Hypernatremia - Na 146 on 1/21, slowly increasing due to  poor PO intake  --D5W at 75 cc/hr  overnight, recheck in AM  Iron Deficiency Anemia -  --Venofer infusion today --monitor CBC  Atrial Flutter with RVR-resolved.Onset 1/15.  Initially on amiodarone gtt, transitioned to Cardizem gtt once BP would tolerate, had much HR control.  Converted to NSR.  --cardiology consulted, Dr. Nehemiah Massed --continueCardizem drip as BP tolerates --maintain HR < 110 --maintain K>4, Mg>2  Chronic A-Fib --continue home Eliquis & amiodarone --continuehome Toprol  History of COVID-19pneumonia --Diagnosed with Covid on 12/30/2019 --s/p remdesivir and steroids discharged 01/01/2020 --persistentgroundglass opacities in bilateral lower lobes and right middle lobe on CT most likelypost-Covid inflammation. --patient without respiratory symptomsinitially, but now with tachypnea and15L/min oxygen requirement.Plan as above.  Marked leukocytosis- improving with antibiotics, but increased again today Improved after percutaneous drainage 56-->36--26--22--22--24-->32   Coronary disease, secondary prevention Hypertension Chronic diastolic CHF,potentially with decompensation contributing to degree of hypoxia --Euvolemic, blood pressure normal --Continueamlodipine, metoprolol --Hold losartan for now, add back if blood pressure warrants. --Patient does not appear to be on antiplatelet agent at home. --Daily weights, strict ins and outs --1500 cc fluid restriction  BPH Patient had Foley placed by urology, will need to be in for 3 weeks with outpatient follow-up with urology. --Continuetamsulosin and finasteride  GERD Continuepantoprazole  Seizure disorder Continues Keppra  Dementia without behavioral disturbance Monitor for behavioral disturbance  High risk of malnutrition He has moderate muscle mass and fat loss.  Longer-term plans Per Dr. Sherryl Manges of 01/12/20: "Conversation1/11/21with daughter [name] and her husband [name] who is a Software engineer about  long-term plans for care for Mr. Lori. I noted it could be very difficult to eradicate abscess without surgery and long-term outcomelooks challenging. She states she will discuss the situation with her brother and sisters but at this point to continue with imaging and treatment as we are doing. She rightbring up the possibility of palliative care with them. On rediscussion1/11/2020 with son-in-lawBuddywire I updated him about marked improvement in abdominal exam as well as recommendations of radiology to continue present course and no further recommendations for imaging. He is concerned about patient's nutritional status and not wanting to eat which I concur with. Will place dietary consult for possible supplements. Patient is DNR"   DVT prophylaxis:on Eliquis Code Status: DNR Family Communication:none at bedside  Disposition Plan:Pending clinical improvement, GOC discussion and eventually PT evaluation.  Overall poor prognosis. Patient requires continued hospital level caregiven need forIV antibiotics for intraabdominal abscess with possible fistulawith drain in place, in addition topersistentacute hypoxic respiratory failure.   Consultants:  Urology  General surgery  Infectious Disease  Pulmonology  Procedures:Placement of abdominal abscess drain percutaneously by IR  Antimicrobials:  Vancomycin- 1/8 to TBC  Zosyn- 1/8-1/14   Cipro - 1/14-1/15  Cefepime - 1/15 to TBD   Objective: Vitals:   01/20/20 0138 01/20/20 0537 01/20/20 0900 01/20/20 1301  BP: 134/88   (!) 164/90  Pulse: 69 76  94  Resp: (!) 24 (!) 24  (!) 36  Temp: (!) 97.5 F (36.4 C)   (!) 97.5 F (36.4 C)  TempSrc: Oral   Axillary  SpO2: 97% 95% 97% 91%  Weight:      Height:        Intake/Output Summary (Last 24 hours) at 01/20/2020 1455 Last data filed at 01/20/2020 1105 Gross per 24 hour  Intake 2105 ml  Output 1830 ml  Net 275 ml   Filed Weights   01/13/20  1348 01/17/20 0500 01/18/20 0359  Weight: 90.1 kg 87.5 kg 88  kg    Examination:  General exam: sleeping, easily wakes, no acute distress Respiratory system: unable to appreciate rhonchi or crackles today, normal respiratory effort, on 15 L/min [place] and NRB. Cardiovascular system: normal S1/S2, RRR, no JVD Gastrointestinal system: soft, non-tender, non-distended, normal bowel sounds. Central nervous system: oriented x 3, no gross focal neurologic deficits, normal speech Extremities: moves all, no cyanosis, normal tone Skin: dry, intact, normal temperature Psychiatry: normal mood, congruent affect, judgement and insight appear normal    Data Reviewed: I have personally reviewed following labs and imaging studies  CBC: Recent Labs  Lab 01/15/20 0544 01/15/20 0544 01/16/20 0647 01/17/20 0618 01/18/20 0406 01/19/20 0909 01/20/20 0614  WBC 32.7*   < > 15.0* 14.1* 15.0* 21.5* 19.5*  NEUTROABS 25.6*  --   --  10.8* 12.3* 16.8* 14.9*  HGB 11.0*   < > 10.9* 10.1* 10.3* 10.2* 9.9*  HCT 32.7*   < > 33.2* 30.4* 31.4* 31.5* 30.4*  MCV 92.1   < > 93.0 91.8 93.7 95.2 93.3  PLT 118*   < > 122* 123* 134* 136* 144*   < > = values in this interval not displayed.   Basic Metabolic Panel: Recent Labs  Lab 01/16/20 0647 01/16/20 0647 01/17/20 0618 01/18/20 0406 01/19/20 0549 01/19/20 0909 01/20/20 0614  NA 139  --  141 144  --  144 146*  K 3.8  --  3.2* 4.3  --  3.9 3.2*  CL 105  --  109 115*  --  111 110  CO2 23  --  26 24  --  25 29  GLUCOSE 144*  --  145* 140*  --  116* 97  BUN 21  --  28* 28*  --  31* 26*  CREATININE 0.67   < > 0.72 0.64 0.79 0.70 0.73  CALCIUM 7.9*  --  7.7* 7.4*  --  8.2* 8.0*  MG 2.2  --  2.4 2.3  --  2.3 2.0  PHOS  --   --   --  2.6  --   --   --    < > = values in this interval not displayed.   GFR: Estimated Creatinine Clearance: 78.4 mL/min (by C-G formula based on SCr of 0.73 mg/dL). Liver Function Tests: Recent Labs  Lab 01/16/20 0647 01/19/20  0909 01/20/20 0614  AST 18 22 18   ALT 19 31 26   ALKPHOS 65 65 63  BILITOT 0.9 0.8 1.1  PROT 5.6* 5.2* 5.4*  ALBUMIN 1.9* 1.9* 2.0*   No results for input(s): LIPASE, AMYLASE in the last 168 hours. No results for input(s): AMMONIA in the last 168 hours. Coagulation Profile: No results for input(s): INR, PROTIME in the last 168 hours. Cardiac Enzymes: No results for input(s): CKTOTAL, CKMB, CKMBINDEX, TROPONINI in the last 168 hours. BNP (last 3 results) No results for input(s): PROBNP in the last 8760 hours. HbA1C: No results for input(s): HGBA1C in the last 72 hours. CBG: Recent Labs  Lab 01/15/20 1716 01/20/20 0207  GLUCAP 136* 90   Lipid Profile: No results for input(s): CHOL, HDL, LDLCALC, TRIG, CHOLHDL, LDLDIRECT in the last 72 hours. Thyroid Function Tests: No results for input(s): TSH, T4TOTAL, FREET4, T3FREE, THYROIDAB in the last 72 hours. Anemia Panel: Recent Labs    01/20/20 0614  VITAMINB12 738  FOLATE 6.2  FERRITIN 364*  TIBC 153*  IRON 8*  RETICCTPCT 2.5   Sepsis Labs: Recent Labs  Lab 01/14/20 0450 01/19/20 0909 01/20/20 0614  PROCALCITON  0.31  --  0.10  LATICACIDVEN  --  1.4  --     No results found for this or any previous visit (from the past 240 hour(s)).       Radiology Studies: No results found.      Scheduled Meds: . amiodarone  100 mg Oral Daily  . amLODipine  5 mg Oral Daily  . apixaban  2.5 mg Oral BID  . Chlorhexidine Gluconate Cloth  6 each Topical Daily  . feeding supplement (ENSURE ENLIVE)  237 mL Oral TID BM  . finasteride  5 mg Oral Daily  . furosemide  40 mg Intravenous BID  . gabapentin  100 mg Oral TID  . levETIRAcetam  250 mg Oral Daily  . levETIRAcetam  500 mg Oral QPM  . Melatonin  5 mg Oral QHS  . metoprolol succinate  25 mg Oral Daily  . multivitamin with minerals  1 tablet Oral Daily  . pantoprazole  40 mg Oral Daily  . potassium chloride  40 mEq Oral Q4H  . sodium chloride flush  10-40 mL  Intracatheter Q12H  . sodium chloride flush  5 mL Intracatheter Q8H  . tamsulosin  0.4 mg Oral Daily   Continuous Infusions: . sodium chloride 250 mL (01/19/20 2215)  . ceFEPime (MAXIPIME) IV 2 g (01/20/20 1053)  . vancomycin 1,250 mg (01/20/20 1044)     LOS: 12 days    Time spent: 30-35 minutes    Ezekiel Slocumb, DO [facility]   If 7PM-7AM, please contact night-coverage [email or web address] 01/20/2020, 2:55 PM

## 2020-01-20 NOTE — Progress Notes (Signed)
ID  Pt on NRB mask somnolent Tries to respond to questions Patient Vitals for the past 24 hrs:  BP Temp Temp src Pulse Resp SpO2  01/20/20 1301 (!) 164/90 (!) 97.5 F (36.4 C) Axillary 94 (!) 36 91 %  01/20/20 0900 -- -- -- -- -- 97 %  01/20/20 0537 -- -- -- 76 (!) 24 95 %  01/20/20 0138 134/88 (!) 97.5 F (36.4 C) Oral 69 (!) 24 97 %  01/19/20 2047 129/78 97.6 F (36.4 C) Oral 95 (!) 26 (!) 86 %     Somnolent NRB Lungs crepts left > rt brusing arms Abdomen soft- drain has some clear fluid Foley catheter draining clear urine  CBC Latest Ref Rng & Units 01/20/2020 01/19/2020 01/18/2020  WBC 4.0 - 10.5 K/uL 19.5(H) 21.5(H) 15.0(H)  Hemoglobin 13.0 - 17.0 g/dL 9.9(L) 10.2(L) 10.3(L)  Hematocrit 39.0 - 52.0 % 30.4(L) 31.5(L) 31.4(L)  Platelets 150 - 400 K/uL 144(L) 136(L) 134(L)    CMP Latest Ref Rng & Units 01/20/2020 01/19/2020 01/19/2020  Glucose 70 - 99 mg/dL 97 116(H) -  BUN 8 - 23 mg/dL 26(H) 31(H) -  Creatinine 0.61 - 1.24 mg/dL 0.73 0.70 0.79  Sodium 135 - 145 mmol/L 146(H) 144 -  Potassium 3.5 - 5.1 mmol/L 3.2(L) 3.9 -  Chloride 98 - 111 mmol/L 110 111 -  CO2 22 - 32 mmol/L 29 25 -  Calcium 8.9 - 10.3 mg/dL 8.0(L) 8.2(L) -  Total Protein 6.5 - 8.1 g/dL 5.4(L) 5.2(L) -  Total Bilirubin 0.3 - 1.2 mg/dL 1.1 0.8 -  Alkaline Phos 38 - 126 U/L 63 65 -  AST 15 - 41 U/L 18 22 -  ALT 0 - 44 U/L 26 31 -   procalcitonin 0.10     Impression/recommendation  Bladder fistula - likely traumatic opening into the abdomen- with intraabdominal collection of urine and abscess formation Emphysematous cystitis MRSA and pseudomonas infection Has an intra-abdominal drain Continue vanco and cefepime until 01/23/20 as there is now adequate source control  Leucocytosis - fluctuating   Acute hypoxic resp failure- CT chest from 1/14 showed extensive GGo on the left compared to right- ? Post  COVID changes VS CHF VS amiodarone pneumonitis Seen by pulmonologist- started  solumedrol Procal 0.10 indicating that is is unlikely to be a bacterial infection Recommend  CXR to see changes  Atrial flutter- seen by Dr.Fath- off amiodarone as concern for lung chnages - currently on metoprolol HTN on amlodipine Frusemide   BPH- foley inserted by urology- on flomax and finasteride Clear urine now    Recent COVID 19 illness 12/30/19- treated with 5 days of IV remdisivir and decadron  Discussed the management with the care team

## 2020-01-20 NOTE — Progress Notes (Signed)
Patient's daughter updated this AM. At this time she is concerned about his present code status and wishes to connect with the hospitalist concerning intubation wishes. Patient experienced periods of anxiety and restlessness this AM. He frequently swats his mask off of his face during these episodes and desaturates to 70%. Patient makes poor attempts at communication with staff in the room but denied pain this AM.

## 2020-01-20 NOTE — Progress Notes (Addendum)
Patient continues to be restless and pulling off his oxygen. This causes him to desat very quickly. Patient has been given scheduled melatonin, prn guaifenesin-codeine, and 1 prn norco. Mittens are on patient. Per Sharion Settler, NP can try haldol to see if this helps patient rest and leave oxygen in place.  Will continue to monitor patient. Vital signs and CBG stable.

## 2020-01-20 NOTE — Progress Notes (Signed)
Patient Name: Jimmy Moore Date of Encounter: 01/20/2020  Hospital Problem List     Principal Problem:   Postprocedural intraabdominal abscess Active Problems:   Hypertensive heart disease with heart failure (HCC)   Unspecified atrial fibrillation (HCC)   Unspecified dementia without behavioral disturbance (HCC)   Chronic diastolic (congestive) heart failure (HCC)   Emphysematous cystitis   History of COVID-19   Atrial flutter with rapid ventricular response [facility])    Patient Profile     Asked to see patient by Dr. Nehemiah Massed who he had done a consultation earlier in the week.  Patient has multiple medical problems including coronary artery disease status post single-vessel coronary artery bypass grafting approximately 20 years ago followed by cardiology at [facility].  He has a history of atrial fibrillation with rate control.  He has a history of dyslipidemia and has failed statins in the past and had been on Repatha as an outpatient.  He has a history of peripheral vascular disease ongoing lower extremity edema.  He has a history of dementia, HFpEF, recent admission from December 30 through January 3 with COVID-19 pneumonia after presenting with hematuria.  He presented to the emergency room on January 8 with complaints of abdominal pain.  He was initially afebrile.  He was hemodynamically stable.  His blood work showed an white count of 56,000 with a hemoglobin of 11.  Urinalysis showed a large leukocyte esterase negative nitrites.  CT of the abdomen pelvis were significant for abdominal abscess contiguous with an inflamed bladder dome and possible enterovesicular fistulization.  He was placed on IV Zosyn and Flagyl and vancomycin.  Urology was consulted.  He was on amiodarone as well as metoprolol succinate for his atrial fibrillation.  He was on Norvasc for blood pressure control.  He had a catheter placed in his abscess in his abdomen.  Patient had been on Eliquis previously however this  was stopped due to his comorbid conditions.  Not felt to be a surgical candidate by general surgery.  His losartan was held due to hypotension.  He was placed back on Eliquis at 2.5 mg twice daily and has tolerated this fairly well.  Concern over possible amiodarone toxicity.  This is been discontinued.  His heart rate appears to be in good control currently.  Subjective   Debilitated male with limited converse ability.  Inpatient Medications    . amLODipine  5 mg Oral Daily  . apixaban  2.5 mg Oral BID  . Chlorhexidine Gluconate Cloth  6 each Topical Daily  . feeding supplement (ENSURE ENLIVE)  237 mL Oral TID BM  . finasteride  5 mg Oral Daily  . gabapentin  100 mg Oral TID  . levETIRAcetam  250 mg Oral Daily  . levETIRAcetam  500 mg Oral QPM  . Melatonin  5 mg Oral QHS  . methylPREDNISolone (SOLU-MEDROL) injection  40 mg Intravenous Q12H  . metoprolol succinate  25 mg Oral Daily  . multivitamin with minerals  1 tablet Oral Daily  . pantoprazole  40 mg Oral Daily  . sodium chloride flush  10-40 mL Intracatheter Q12H  . sodium chloride flush  5 mL Intracatheter Q8H  . tamsulosin  0.4 mg Oral Daily    Vital Signs    Vitals:   01/20/20 0138 01/20/20 0537 01/20/20 0900 01/20/20 1301  BP: 134/88   (!) 164/90  Pulse: 69 76  94  Resp: (!) 24 (!) 24  (!) 36  Temp: (!) 97.5 F (36.4 C)   (!)  97.5 F (36.4 C)  TempSrc: Oral   Axillary  SpO2: 97% 95% 97% 91%  Weight:      Height:        Intake/Output Summary (Last 24 hours) at 01/20/2020 1556 Last data filed at 01/20/2020 1105 Gross per 24 hour  Intake 2105 ml  Output 1830 ml  Net 275 ml   Filed Weights   01/13/20 1348 01/17/20 0500 01/18/20 0359  Weight: 90.1 kg 87.5 kg 88 kg    Physical Exam  Obtained via discussion with patient's primary nurse. GEN: Chronically ill-appearing male  Cardiac: RRR, .  Respiratory:  Respirations regular and unlabored, clear to auscultation bilaterally.   Labs    CBC Recent Labs     01/19/20 0909 01/20/20 0614  WBC 21.5* 19.5*  NEUTROABS 16.8* 14.9*  HGB 10.2* 9.9*  HCT 31.5* 30.4*  MCV 95.2 93.3  PLT 136* 123456*   Basic Metabolic Panel Recent Labs    01/18/20 0406 01/19/20 0549 01/19/20 0909 01/20/20 0614  NA 144  --  144 146*  K 4.3  --  3.9 3.2*  CL 115*  --  111 110  CO2 24  --  25 29  GLUCOSE 140*  --  116* 97  BUN 28*  --  31* 26*  CREATININE 0.64   < > 0.70 0.73  CALCIUM 7.4*  --  8.2* 8.0*  MG 2.3  --  2.3 2.0  PHOS 2.6  --   --   --    < > = values in this interval not displayed.   Liver Function Tests Recent Labs    01/19/20 0909 01/20/20 0614  AST 22 18  ALT 31 26  ALKPHOS 65 63  BILITOT 0.8 1.1  PROT 5.2* 5.4*  ALBUMIN 1.9* 2.0*   No results for input(s): LIPASE, AMYLASE in the last 72 hours. Cardiac Enzymes No results for input(s): CKTOTAL, CKMB, CKMBINDEX, TROPONINI in the last 72 hours. BNP No results for input(s): BNP in the last 72 hours. D-Dimer No results for input(s): DDIMER in the last 72 hours. Hemoglobin A1C No results for input(s): HGBA1C in the last 72 hours. Fasting Lipid Panel No results for input(s): CHOL, HDL, LDLCALC, TRIG, CHOLHDL, LDLDIRECT in the last 72 hours. Thyroid Function Tests No results for input(s): TSH, T4TOTAL, T3FREE, THYROIDAB in the last 72 hours.  Invalid input(s): FREET3  Telemetry    Sinus rhythm with intermittent runs of SVT probably atrial flutter with rapid ventricular response.  ECG    Sinus rhythm with right bundle branch block  Radiology    CT ABDOMEN PELVIS WO CONTRAST  Result Date: 01/15/2020 CLINICAL DATA:  Urinary catheter displacement, unable to determine output EXAM: CT ABDOMEN AND PELVIS WITHOUT CONTRAST TECHNIQUE: Multidetector CT imaging of the abdomen and pelvis was performed following the standard protocol without IV contrast. COMPARISON:  January 14, 2020 FINDINGS: Lower chest: The visualized heart size within normal limits. No pericardial fluid/thickening. No  hiatal hernia. Coronary artery calcifications are seen. Small bilateral pleural effusions are noted. Patchy ground-glass opacities are seen at both lung bases. Hepatobiliary: Although limited due to the lack of intravenous contrast, normal in appearance without gross focal abnormality. No evidence of calcified gallstones or biliary ductal dilatation. Pancreas:  Unremarkable.  No surrounding inflammatory changes. Spleen: Normal in size. Although limited due to the lack of intravenous contrast, normal in appearance. Adrenals/Urinary Tract: Both adrenal glands appear normal. Bilateral renal atrophy is seen. No hydronephrosis. No renal or collecting system calculi. Foley catheter  balloon is seen again inflated within the prostatic urethra. The tip appears to be just projecting into the partially decompressed bladder. There is a small amount of air seen at the vesicoureteral junction. There is contrast seen within the partially decompressed bladder with diffuse wall thickening and calcifications. Small foci of air seen within the bladder dome with a fistulous tract extending to the left anterior abdominal wall collection. Stomach/Bowel: The stomach, small bowel, and colon are normal in appearance. No inflammatory changes or obstructive findings. Scattered colonic diverticula are noted. There is a moderate to large amount of colonic stool. Vascular/Lymphatic: There are no enlarged abdominal or pelvic lymph nodes. Scattered aortic atherosclerotic calcifications are seen without aneurysmal dilatation. Reproductive: Enlarged heterogeneous prostate gland. Other: A left lower anterior abdominal wall pigtail catheter is seen. There is a residual small collection measuring 2.9 cm which now appears to have a small amount of contrast seen within it. There are small foci of air at the dome of the bladder with a possible fistulous tract between the collection and the dome of the bladder, series 2, image 78. Musculoskeletal: No acute  or significant osseous findings. IMPRESSION: 1. Small bilateral pleural effusions with adjacent patchy airspace consolidation, consistent with COVID pneumonia. 2. Foley catheter balloon dilated within the prosthetic urethra as on prior exam. 3. Contrast within a chronically thickened partially decompressed bladder. 4. Left lower anterior abdominal pigtail catheter within a 2.9 cm collection that now contains contrast, there appears to be a small fistulous tract extending to the bladder dome where there are small foci of air as described above. 5. Diverticulosis without diverticulitis. 6.  Aortic Atherosclerosis (ICD10-I70.0). Electronically Signed   By: Prudencio Pair M.D.   On: 01/15/2020 05:14   DG Chest 1 View  Result Date: 12/30/2019 CLINICAL DATA:  Dyspnea EXAM: CHEST  1 VIEW COMPARISON:  07/21/2013 FINDINGS: Cardiac shadow is stable. Postsurgical changes are again noted. Lungs are well aerated bilaterally. Chronic blunting of the left costophrenic angle is noted. Some patchy airspace opacity is noted in the right mid lung laterally which may represent some early infiltrate. No other focal abnormality is noted. IMPRESSION: Patchy airspace opacity in the right mid lung. Electronically Signed   By: Inez Catalina M.D.   On: 12/30/2019 12:57   CT ANGIO CHEST PE W OR WO CONTRAST  Result Date: 01/14/2020 CLINICAL DATA:  COVID-19 positive. Tachypnea. Hypoxia. Inpatient. Lower abdominal abscess status post percutaneous drainage on 01/30/2020. EXAM: CT ANGIOGRAPHY CHEST CT ABDOMEN AND PELVIS WITH CONTRAST TECHNIQUE: Multidetector CT imaging of the chest was performed using the standard protocol during bolus administration of intravenous contrast. Multiplanar CT image reconstructions and MIPs were obtained to evaluate the vascular anatomy. Multidetector CT imaging of the abdomen and pelvis was performed using the standard protocol during bolus administration of intravenous contrast. CONTRAST:  150mL OMNIPAQUE  IOHEXOL 350 MG/ML SOLN COMPARISON:  01/07/2020 CT abdomen/pelvis. 12/30/2019 chest radiograph. FINDINGS: CTA CHEST FINDINGS Cardiovascular: The study is low-to-moderate quality for the evaluation of pulmonary embolism, with substantial motion degradation. There are no convincing filling defects in the central, lobar, segmental or subsegmental pulmonary artery branches to suggest acute pulmonary embolism. Atherosclerotic nonaneurysmal thoracic aorta. Dilated main pulmonary artery (3.6 cm diameter). Top-normal heart size. No significant pericardial fluid/thickening. Left anterior descending and right coronary atherosclerosis. Mediastinum/Nodes: Hypodense bilateral thyroid nodules, largest 2.4 cm on the left. Unremarkable esophagus. No pathologically enlarged axillary, mediastinal or hilar lymph nodes. Lungs/Pleura: No pneumothorax. Small dependent bilateral pleural effusions. Extensive patchy consolidation and ground-glass opacity  in the left greater than right lungs involving all lung lobes. No lung masses or discrete pulmonary nodules on these motion degraded images. Musculoskeletal: No aggressive appearing focal osseous lesions. Intact sternotomy wires. Moderate thoracic spondylosis. Review of the MIP images confirms the above findings. CT ABDOMEN and PELVIS FINDINGS Hepatobiliary: Normal liver with no liver mass. Normal gallbladder with no radiopaque cholelithiasis. No biliary ductal dilatation. Pancreas: Normal, with no mass or duct dilation. Spleen: Normal size. No mass. Adrenals/Urinary Tract: Normal right adrenal. Left adrenal 2.0 cm nodule with density 15 HU, unchanged using similar measurement technique in the short interval. No hydronephrosis. Scattered subcentimeter hypodense renal cortical lesions in both kidneys are too small to characterize and are unchanged. Hypodense exophytic 1.3 cm renal cortical lesion in the posterior lower left kidney (series 505/image 44), stable. Normal caliber ureters.  Decompressed urinary bladder with chronic diffuse bladder wall thickening. Nonspecific gas within nondependent bladder lumen is decreased. Foley catheter balloon inflated within the prostatic urethra with Foley tip at the vesicourethral junction. Stomach/Bowel: Normal non-distended stomach. Left-sided percutaneous pigtail drain terminates in the anterior midline lower peritoneal cavity with nearly resolved fluid collection in this location, now measuring 2.6 x 0.7 cm (series 505/image 66), previously 5.3 x 2.6 cm. No residual gas within this collection. Tip of drain remains in close proximity to the dome of the bladder. Fat stranding surrounding this collection has decreased. Normal caliber small bowel loops with no small bowel wall thickening. Appendix not discretely visualized. Scattered mild colonic diverticulosis with no large bowel wall thickening or acute pericolonic fat stranding. Vascular/Lymphatic: Atherosclerotic nonaneurysmal abdominal aorta. Patent portal, splenic, hepatic and renal veins. No pathologically enlarged lymph nodes in the abdomen or pelvis. Reproductive: Marked prostatomegaly. Other: No pneumoperitoneum. No ascites. No new focal fluid collections. Musculoskeletal: No aggressive appearing focal osseous lesions. Marked lumbar spondylosis. Review of the MIP images confirms the above findings. IMPRESSION: 1. Limited motion degraded scan with no evidence of acute pulmonary embolism. 2. Extensive patchy consolidation and ground-glass opacity in the left greater than right lungs compatible with multilobar pneumonia due to reported COVID-19. 3. Small dependent bilateral pleural effusions. 4. Dilated main pulmonary artery, suggesting pulmonary arterial hypertension. 5. Near complete resolution of anterior midline lower peritoneal cavity fluid collection status post percutaneous drainage. Tip of percutaneous drain remains in close proximity to the dome of the bladder, with fistulization of this  residual collection with the bladder lumen not excluded on the basis of this scan. Decreased gas within the nondependent bladder. 6. Foley catheter balloon is still dilated within the prostatic urethra with Foley tip at the vesicourethral junction. Recommend repositioning/replacement. Bladder is decompressed. No hydronephrosis. 7. Chronic mild diffuse bladder wall thickening is nonspecific and probably due to chronic bladder outlet obstruction by the markedly enlarged prostate. 8. Stable left adrenal nodule, probably an adenoma, for which follow-up adrenal protocol CT abdomen without and with IV contrast may be considered in 12 months. 9. Mild scattered colonic diverticulosis. 10.  Aortic Atherosclerosis (ICD10-I70.0). Electronically Signed   By: Ilona Sorrel M.D.   On: 01/14/2020 15:07   CT ABDOMEN PELVIS W CONTRAST  Result Date: 01/14/2020 CLINICAL DATA:  COVID-19 positive. Tachypnea. Hypoxia. Inpatient. Lower abdominal abscess status post percutaneous drainage on 01/28/2020. EXAM: CT ANGIOGRAPHY CHEST CT ABDOMEN AND PELVIS WITH CONTRAST TECHNIQUE: Multidetector CT imaging of the chest was performed using the standard protocol during bolus administration of intravenous contrast. Multiplanar CT image reconstructions and MIPs were obtained to evaluate the vascular anatomy. Multidetector CT imaging of the  abdomen and pelvis was performed using the standard protocol during bolus administration of intravenous contrast. CONTRAST:  156mL OMNIPAQUE IOHEXOL 350 MG/ML SOLN COMPARISON:  01/25/2020 CT abdomen/pelvis. 12/30/2019 chest radiograph. FINDINGS: CTA CHEST FINDINGS Cardiovascular: The study is low-to-moderate quality for the evaluation of pulmonary embolism, with substantial motion degradation. There are no convincing filling defects in the central, lobar, segmental or subsegmental pulmonary artery branches to suggest acute pulmonary embolism. Atherosclerotic nonaneurysmal thoracic aorta. Dilated main pulmonary  artery (3.6 cm diameter). Top-normal heart size. No significant pericardial fluid/thickening. Left anterior descending and right coronary atherosclerosis. Mediastinum/Nodes: Hypodense bilateral thyroid nodules, largest 2.4 cm on the left. Unremarkable esophagus. No pathologically enlarged axillary, mediastinal or hilar lymph nodes. Lungs/Pleura: No pneumothorax. Small dependent bilateral pleural effusions. Extensive patchy consolidation and ground-glass opacity in the left greater than right lungs involving all lung lobes. No lung masses or discrete pulmonary nodules on these motion degraded images. Musculoskeletal: No aggressive appearing focal osseous lesions. Intact sternotomy wires. Moderate thoracic spondylosis. Review of the MIP images confirms the above findings. CT ABDOMEN and PELVIS FINDINGS Hepatobiliary: Normal liver with no liver mass. Normal gallbladder with no radiopaque cholelithiasis. No biliary ductal dilatation. Pancreas: Normal, with no mass or duct dilation. Spleen: Normal size. No mass. Adrenals/Urinary Tract: Normal right adrenal. Left adrenal 2.0 cm nodule with density 15 HU, unchanged using similar measurement technique in the short interval. No hydronephrosis. Scattered subcentimeter hypodense renal cortical lesions in both kidneys are too small to characterize and are unchanged. Hypodense exophytic 1.3 cm renal cortical lesion in the posterior lower left kidney (series 505/image 44), stable. Normal caliber ureters. Decompressed urinary bladder with chronic diffuse bladder wall thickening. Nonspecific gas within nondependent bladder lumen is decreased. Foley catheter balloon inflated within the prostatic urethra with Foley tip at the vesicourethral junction. Stomach/Bowel: Normal non-distended stomach. Left-sided percutaneous pigtail drain terminates in the anterior midline lower peritoneal cavity with nearly resolved fluid collection in this location, now measuring 2.6 x 0.7 cm (series  505/image 66), previously 5.3 x 2.6 cm. No residual gas within this collection. Tip of drain remains in close proximity to the dome of the bladder. Fat stranding surrounding this collection has decreased. Normal caliber small bowel loops with no small bowel wall thickening. Appendix not discretely visualized. Scattered mild colonic diverticulosis with no large bowel wall thickening or acute pericolonic fat stranding. Vascular/Lymphatic: Atherosclerotic nonaneurysmal abdominal aorta. Patent portal, splenic, hepatic and renal veins. No pathologically enlarged lymph nodes in the abdomen or pelvis. Reproductive: Marked prostatomegaly. Other: No pneumoperitoneum. No ascites. No new focal fluid collections. Musculoskeletal: No aggressive appearing focal osseous lesions. Marked lumbar spondylosis. Review of the MIP images confirms the above findings. IMPRESSION: 1. Limited motion degraded scan with no evidence of acute pulmonary embolism. 2. Extensive patchy consolidation and ground-glass opacity in the left greater than right lungs compatible with multilobar pneumonia due to reported COVID-19. 3. Small dependent bilateral pleural effusions. 4. Dilated main pulmonary artery, suggesting pulmonary arterial hypertension. 5. Near complete resolution of anterior midline lower peritoneal cavity fluid collection status post percutaneous drainage. Tip of percutaneous drain remains in close proximity to the dome of the bladder, with fistulization of this residual collection with the bladder lumen not excluded on the basis of this scan. Decreased gas within the nondependent bladder. 6. Foley catheter balloon is still dilated within the prostatic urethra with Foley tip at the vesicourethral junction. Recommend repositioning/replacement. Bladder is decompressed. No hydronephrosis. 7. Chronic mild diffuse bladder wall thickening is nonspecific and probably due to chronic  bladder outlet obstruction by the markedly enlarged prostate. 8.  Stable left adrenal nodule, probably an adenoma, for which follow-up adrenal protocol CT abdomen without and with IV contrast may be considered in 12 months. 9. Mild scattered colonic diverticulosis. 10.  Aortic Atherosclerosis (ICD10-I70.0). Electronically Signed   By: Ilona Sorrel M.D.   On: 01/14/2020 15:07   CT ABDOMEN PELVIS W CONTRAST  Result Date: 01/18/2020 CLINICAL DATA:  Unspecified abdominal pain, COVID-19 positive EXAM: CT ABDOMEN AND PELVIS WITH CONTRAST TECHNIQUE: Multidetector CT imaging of the abdomen and pelvis was performed using the standard protocol following bolus administration of intravenous contrast. CONTRAST:  132mL OMNIPAQUE IOHEXOL 300 MG/ML  SOLN COMPARISON:  None. FINDINGS: Lower chest: Mixed ground-glass, consolidative and reticular opacities are present in the lower lobes, right middle lobe and lingula compatible with atypical infection in the setting of COVID-19 positivity. Mild airways thickening is present as well. Hepatobiliary: No focal liver abnormality is seen. No gallstones, gallbladder wall thickening, or biliary dilatation. Pancreas: Fatty replacement of the pancreas. No pancreatic ductal dilatation or surrounding inflammatory changes. Spleen: Normal in size without focal abnormality. 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 No worrisome adrenal lesions. Mild bilateral symmetric perinephric stranding, a nonspecific finding though may correlate with either age or decreased renal function. Kidneys enhance and excrete symmetrically. Some mild left urothelial thickening and periureteral stranding is present. The urinary bladder is circumferentially thickened with multiple foci intraluminal gas. Several punctate foci of gas are seen within the wall of the bladder. These could feasibly reflect gas within small bladder diverticula or crenulation given an enlarged prostate  and likely sequela of chronic outlet obstruction however emphysematous cystitis is not fully excluded. Furthermore, there is focal discontinuity along the dome of the bladder contiguous with a an irregular air and fluid containing collection in the midline abdomen closely apposed to several loops of thickened and enhancing small bowel. An enterovesicular fistula is suspected. Stomach/Bowel: Distal esophagus, stomach and duodenal sweep are unremarkable. There are few focally thickened segments of small bowel with irregular mural enhancement and edematous change in the low anterior mid abdomen centered upon a rim enhancing 5.2 x 2.6 x 7.7cm air and fluid containing collection worrisome for developing abscess or potential enterovesicular fistula given direct continuity the dome of the bladder as detailed above. More distal small bowel has a normal appearance. Appendix is not well visualized. No focal pericecal inflammation is seen. Pancolonic diverticulosis without evidence of acute diverticulitis. Vascular/Lymphatic: Extensive atherosclerotic calcification of the abdominal aorta, iliac arteries and branch vessels. No aneurysm or ectasia is seen. Reactive adenopathy in the low mesentery with some geographic mesenteric stranding involving the affected bowel loops detailed above. Reproductive: The prostate is markedly enlarged. A Foley catheter appears inflated in the low prosthetic urethra. Some focal hypoattenuating cystic foci within the prostate could reflect possible abscess given the adjacent inflammation and stranding. Other: Phlegmonous change in the mid mesentery with the air and fluid containing collection are arising from the dome of the bladder and or bowel loops. No free intraperitoneal air is seen. No bowel containing hernias. Musculoskeletal: Atrophy of the anterior abdominal wall musculature Multilevel degenerative changes are present in the imaged portions of the spine. No acute osseous abnormality or  suspicious osseous lesion. Prior sternotomy changes are noted. Additional degenerative changes noted in the hips and SI joints bilaterally as well as the symphysis pubis. IMPRESSION: 1. 5.2 x  2.6 x 7.7cm rim enhancing air and fluid containing collection in the low anterior mid abdomen. Collection is directly contiguous with the inflamed bladder dome with several adjacent loops of edematous and thickened small bowel. Overall appearance is worrisome for an abscess with possible enterovesicular fistulization. 2. Bladder itself is circumferentially thickened with gas in the bladder wall. While these foci of air could be within granulations of the bladder given an enlarged prostate and likely some sequela of chronic outlet obstruction, given the patient's extreme white count features are highly suspicious for an emphysematous cystitis. 3. Inflated Foley catheter balloon is positioned within the prostatic urethra. Recommend repositioning or removal. 4. Rim enhancing low attenuation collection within the prostate parenchyma worrisome for potential abscess. 5. Mixed ground-glass, consolidative and reticular opacities in the lower lobes, right middle lobe and lingula compatible with atypical infection in the setting of COVID positivity positivity. 6. Colonic diverticulosis without evidence of diverticulitis. 7. 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 8.  Aortic Atherosclerosis (ICD10-I70.0). These results were called by telephone at the time of interpretation on 01/16/2020 at 4:46 am to provider [facility] , who verbally acknowledged these results. Electronically Signed   By: Lovena Le M.D.   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 Please perform CT-guided percutaneous drainage catheter placement for infection source control purposes. EXAM: CT IMAGE  GUIDED DRAINAGE BY PERCUTANEOUS CATHETER COMPARISON:  CT abdomen pelvis-01/26/2020 MEDICATIONS: The patient is currently admitted to the hospital and receiving intravenous antibiotics. The antibiotics were administered within an appropriate time frame prior to the initiation of the procedure. ANESTHESIA/SEDATION: Moderate (conscious) sedation was employed during this procedure. A total of Versed 0.5 mg and Fentanyl 50 mcg was administered intravenously. Moderate Sedation Time: 11 minutes. The patient's level of consciousness and vital signs were monitored continuously by radiology nursing throughout the procedure under my direct supervision. CONTRAST:  None COMPLICATIONS: None immediate. PROCEDURE: Informed written consent was obtained from the patient after a discussion of the risks, benefits and alternatives to treatment. The patient was placed supine on the CT gantry and a pre procedural CT was performed re-demonstrating the known abscess/fluid collection within the ventral aspect the lower abdomen with dominant mixed air in fluid containing component measuring approximately 5.0 x 1.9 cm (image 21, series 2). The procedure was planned. A timeout was performed prior to the initiation of the procedure. The skin overlying the ventral aspect of the lower abdomen/pelvis was prepped and draped in the usual sterile fashion. The overlying soft tissues were anesthetized with 1% lidocaine with epinephrine. Appropriate trajectory was planned with the use of a 22 gauge spinal needle. An 18 gauge trocar needle was advanced into the abscess/fluid collection and a short Amplatz super stiff wire was coiled within the collection. Appropriate positioning was confirmed with a limited CT scan. The tract was serially dilated allowing placement of a 10 [place] all-purpose drainage catheter. Appropriate positioning was confirmed with a limited postprocedural CT scan. Approximately 10 ml of blood-tinged serous fluid was aspirated. The  tube was connected to a JP bulb and sutured in place. A dressing was placed. The patient tolerated the procedure well without immediate post procedural complication. IMPRESSION: Successful CT guided placement of a 10 French all purpose drain catheter into the midline of the lower abdomen/pelvis with aspiration of 10 mL of blood-tinged serous fluid. Samples were sent to  the laboratory as requested by the ordering clinical team. Electronically Signed   By: Sandi Mariscal M.D.   On: 01/11/2020 08:24    Assessment & Plan    Atrial flutter-patient has been taken off of amiodarone.  I think this is acceptable.  Currently would continue with metoprolol succinate at 25 mg daily following heart rate and rhythm.  If there is breakthrough will consider switching to or adding Cardizem 30 mg every 6.  We will continue with Eliquis at 2.5 mg twice daily following for bleeding.  Coronary artery disease-appears stable at present.  Will follow.  Hypertension-we will continue with metoprolol, amlodipine 5 mg daily.  Poor prognosis.  We will follow with you.  Signed, Javier Docker Arneda Sappington MD 01/20/2020, 3:56 PM  Pager: [phone]

## 2020-01-20 NOTE — Progress Notes (Signed)
Pharmacy Antibiotic Note  Jimmy Moore is a 86 y.o. 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 Patient admitted from skilled nursing facility. Patient with recent findings of MRSA and Pseudomonas in urine on 1/3 and treatment for COVID-19 pneumonia from 12/30-1/3. Pharmacy was consulted for Vancomycin dosing. This is day #12 of IV antibiotics, SCr had been trending up slightly therefore a vancomycin trough was drawn, which was elevated above planned calculated level. A second random level was ordered for this am to ensure adequate clearance.   Vancomycin Levels: Peak 01/12 1339: 38.7 mcg/mL Trough 01/13 0903: 12 mcg/mL Trough 01/19 0909 21 mcg/mL Random: 01/20 0614 13 mcg/mL  Kinetics Based on Latest Trough Value: T1/2: 17.8h Ke: 0.039 h-1  Plan: Restart vancomycin at 1250 mg IV every 24 hours  Dr Delaine Lame plans for 14 days total therapy  SCr in am   Height: 6\' 4"  (193 cm) Weight: 194 lb 0.1 oz (88 kg) IBW/kg (Calculated) : 86.8  Temp (24hrs), Avg:97.8 F (36.6 C), Min:97.5 F (36.4 C), Max:98 F (36.7 C)  Recent Labs  Lab 01/13/20 0903 01/14/20 0450 01/16/20 0647 01/17/20 0618 01/18/20 0406 01/19/20 0549 01/19/20 0909 01/20/20 0614  WBC  --    < > 15.0* 14.1* 15.0*  --  21.5* 19.5*  CREATININE  --    < > 0.67 0.72 0.64 0.79 0.70  --   LATICACIDVEN  --   --   --   --   --   --  1.4  --   VANCOTROUGH 12*  --   --   --   --   --  21*  --   VANCORANDOM  --   --   --   --   --   --   --  13   < > = values in this interval not displayed.    Estimated Creatinine Clearance: 78.4 mL/min (by C-G formula based on SCr of 0.7 mg/dL).    No Known Allergies  Antimicrobials this admission: Metronidazole 1/8 x 1 Zosyn 1/8 >> 1/14 Ciprofloxacin 1/14 >> 1/15 Cefepime 1/16 >> Vancomycin 1/8 >>  Microbiology results: 1/8 BCx: no growth final 1/8 UCx: P aeruginosa 1/8 WCx P aeruginosa, MRSA   Thank you for allowing  pharmacy to be a part of this patient's care.  Dallie Piles 01/20/2020 7:04 AM

## 2020-01-21 DIAGNOSIS — N412 Abscess of prostate: Secondary | ICD-10-CM

## 2020-01-21 DIAGNOSIS — R451 Restlessness and agitation: Secondary | ICD-10-CM

## 2020-01-21 DIAGNOSIS — Z515 Encounter for palliative care: Secondary | ICD-10-CM

## 2020-01-21 DIAGNOSIS — J9601 Acute respiratory failure with hypoxia: Secondary | ICD-10-CM

## 2020-01-21 DIAGNOSIS — R188 Other ascites: Secondary | ICD-10-CM

## 2020-01-21 LAB — CBC WITH DIFFERENTIAL/PLATELET
Abs Immature Granulocytes: 0.72 10*3/uL — ABNORMAL HIGH (ref 0.00–0.07)
Basophils Absolute: 0 10*3/uL (ref 0.0–0.1)
Basophils Relative: 0 %
Eosinophils Absolute: 0 10*3/uL (ref 0.0–0.5)
Eosinophils Relative: 0 %
HCT: 32 % — ABNORMAL LOW (ref 39.0–52.0)
Hemoglobin: 10.3 g/dL — ABNORMAL LOW (ref 13.0–17.0)
Immature Granulocytes: 4 %
Lymphocytes Relative: 3 %
Lymphs Abs: 0.4 10*3/uL — ABNORMAL LOW (ref 0.7–4.0)
MCH: 30.3 pg (ref 26.0–34.0)
MCHC: 32.2 g/dL (ref 30.0–36.0)
MCV: 94.1 fL (ref 80.0–100.0)
Monocytes Absolute: 1.1 10*3/uL — ABNORMAL HIGH (ref 0.1–1.0)
Monocytes Relative: 6 %
Neutro Abs: 15 10*3/uL — ABNORMAL HIGH (ref 1.7–7.7)
Neutrophils Relative %: 87 %
Platelets: 153 10*3/uL (ref 150–400)
RBC: 3.4 MIL/uL — ABNORMAL LOW (ref 4.22–5.81)
RDW: 14.8 % (ref 11.5–15.5)
WBC: 17.3 10*3/uL — ABNORMAL HIGH (ref 4.0–10.5)
nRBC: 0 % (ref 0.0–0.2)

## 2020-01-21 LAB — COMPREHENSIVE METABOLIC PANEL
ALT: 23 U/L (ref 0–44)
AST: 16 U/L (ref 15–41)
Albumin: 2.1 g/dL — ABNORMAL LOW (ref 3.5–5.0)
Alkaline Phosphatase: 75 U/L (ref 38–126)
Anion gap: 7 (ref 5–15)
BUN: 25 mg/dL — ABNORMAL HIGH (ref 8–23)
CO2: 28 mmol/L (ref 22–32)
Calcium: 8.3 mg/dL — ABNORMAL LOW (ref 8.9–10.3)
Chloride: 110 mmol/L (ref 98–111)
Creatinine, Ser: 0.76 mg/dL (ref 0.61–1.24)
GFR calc Af Amer: >60 mL/min (ref >60)
GFR calc non Af Amer: >60 mL/min (ref >60)
Glucose, Bld: 207 mg/dL — ABNORMAL HIGH (ref 70–99)
Potassium: 3.9 mmol/L (ref 3.5–5.1)
Sodium: 145 mmol/L (ref 135–145)
Total Bilirubin: 0.7 mg/dL (ref 0.3–1.2)
Total Protein: 6.1 g/dL — ABNORMAL LOW (ref 6.5–8.1)

## 2020-01-21 LAB — PHOSPHORUS: Phosphorus: 3.1 mg/dL (ref 2.5–4.6)

## 2020-01-21 LAB — MAGNESIUM: Magnesium: 2.1 mg/dL (ref 1.7–2.4)

## 2020-01-21 MED ORDER — POLYETHYLENE GLYCOL 3350 17 G PO PACK: 17.0000 g | PACK | Freq: Once | ORAL | Status: DC

## 2020-01-21 MED ORDER — MORPHINE BOLUS VIA INFUSION
1.0000 mg | INTRAVENOUS | Status: DC | PRN
  Administered 2020-01-21: 2 mg via INTRAVENOUS
  Filled 2020-01-21: qty 2

## 2020-01-21 MED ORDER — MORPHINE 100MG IN NS 100ML (1MG/ML) PREMIX INFUSION
0.5000 mg/h | INTRAVENOUS | Status: DC
  Administered 2020-01-21: 0.5 mg/h via INTRAVENOUS
  Filled 2020-01-21: qty 100

## 2020-01-21 MED ORDER — SCOPOLAMINE 1 MG/3DAYS TD PT72
1.0000 | MEDICATED_PATCH | TRANSDERMAL | Status: DC
  Administered 2020-01-21: 1.5 mg via TRANSDERMAL
  Filled 2020-01-21: qty 1

## 2020-01-21 MED ORDER — GLYCOPYRROLATE 0.2 MG/ML IJ SOLN
0.2000 mg | INTRAMUSCULAR | Status: DC | PRN
  Filled 2020-01-21: qty 1

## 2020-01-24 LAB — FUNGITELL, SERUM

## 2020-02-01 NOTE — Progress Notes (Signed)
Sterile water added to [facility] humidifier bottle.

## 2020-02-01 NOTE — Progress Notes (Signed)
Patient Name: Jimmy Moore Date of Encounter: 02/15/20  Hospital Problem List     Principal Problem:   Postprocedural intraabdominal abscess Active Problems:   Hypertensive heart disease with heart failure (HCC)   Unspecified atrial fibrillation (HCC)   Unspecified dementia without behavioral disturbance (HCC)   Chronic diastolic (congestive) heart failure (HCC)   Emphysematous cystitis   History of COVID-19   Atrial flutter with rapid ventricular response [facility])    Patient Profile     Patient has multiple medical problems including coronary artery disease status post single-vessel coronary artery bypass grafting approximately 20 years ago followed by cardiology at [place].  He has a history of atrial fibrillation with rate control.  He has a history of dyslipidemia and has failed statins in the past and had been on Repatha as an outpatient.  He has a history of peripheral vascular disease ongoing lower extremity edema.  He has a history of dementia, HFpEF, recent admission from December 30 through January 3 with COVID-19 pneumonia after presenting with hematuria.   Subjective   Currently in sinus rhythm  Inpatient Medications    . amLODipine  5 mg Oral Daily  . apixaban  2.5 mg Oral BID  . Chlorhexidine Gluconate Cloth  6 each Topical Daily  . feeding supplement (ENSURE ENLIVE)  237 mL Oral TID BM  . finasteride  5 mg Oral Daily  . gabapentin  100 mg Oral TID  . levETIRAcetam  250 mg Oral Daily  . levETIRAcetam  500 mg Oral QPM  . Melatonin  5 mg Oral QHS  . methylPREDNISolone (SOLU-MEDROL) injection  40 mg Intravenous Q12H  . metoprolol succinate  25 mg Oral Daily  . multivitamin with minerals  1 tablet Oral Daily  . pantoprazole  40 mg Oral Daily  . sodium chloride flush  10-40 mL Intracatheter Q12H  . sodium chloride flush  5 mL Intracatheter Q8H  . tamsulosin  0.4 mg Oral Daily    Vital Signs    Vitals:   01/20/20 2109 2020/02/15 0456 02-15-2020 0604  02/15/20 0630  BP: (!) 132/97  (!) 155/91   Pulse: 83  (!) 57   Resp: (!) 24  (!) 24   Temp: 97.9 F (36.6 C)  97.6 F (36.4 C)   TempSrc: Axillary  Oral   SpO2: 97%  100% 91%  Weight:  85.6 kg    Height:        Intake/Output Summary (Last 24 hours) at Feb 15, 2020 0752 Last data filed at 02/15/20 0555 Gross per 24 hour  Intake 3765.36 ml  Output 610 ml  Net 3155.36 ml   Filed Weights   01/17/20 0500 01/18/20 0359 02-15-20 0456  Weight: 87.5 kg 88 kg 85.6 kg    Physical Exam      Labs    CBC Recent Labs    01/20/20 0614 2020-02-15 0359  WBC 19.5* 17.3*  NEUTROABS 14.9* 15.0*  HGB 9.9* 10.3*  HCT 30.4* 32.0*  MCV 93.3 94.1  PLT 144* 0000000   Basic Metabolic Panel Recent Labs    01/20/20 0614 2020/02/15 0359  NA 146* 145  K 3.2* 3.9  CL 110 110  CO2 29 28  GLUCOSE 97 207*  BUN 26* 25*  CREATININE 0.73 0.76  CALCIUM 8.0* 8.3*  MG 2.0 2.1  PHOS  --  3.1   Liver Function Tests Recent Labs    01/20/20 0614 2020-02-15 0359  AST 18 16  ALT 26 23  ALKPHOS 63 75  BILITOT 1.1 0.7  PROT 5.4* 6.1*  ALBUMIN 2.0* 2.1*   No results for input(s): LIPASE, AMYLASE in the last 72 hours. Cardiac Enzymes No results for input(s): CKTOTAL, CKMB, CKMBINDEX, TROPONINI in the last 72 hours. BNP No results for input(s): BNP in the last 72 hours. D-Dimer No results for input(s): DDIMER in the last 72 hours. Hemoglobin A1C No results for input(s): HGBA1C in the last 72 hours. Fasting Lipid Panel No results for input(s): CHOL, HDL, LDLCALC, TRIG, CHOLHDL, LDLDIRECT in the last 72 hours. Thyroid Function Tests No results for input(s): TSH, T4TOTAL, T3FREE, THYROIDAB in the last 72 hours.  Invalid input(s): FREET3  Telemetry    Normal sinus rhythm with right bundle branch block  ECG       Radiology    CT ABDOMEN PELVIS WO CONTRAST  Result Date: 01/15/2020 CLINICAL DATA:  Urinary catheter displacement, unable to determine output EXAM: CT ABDOMEN AND PELVIS  WITHOUT CONTRAST TECHNIQUE: Multidetector CT imaging of the abdomen and pelvis was performed following the standard protocol without IV contrast. COMPARISON:  January 14, 2020 FINDINGS: Lower chest: The visualized heart size within normal limits. No pericardial fluid/thickening. No hiatal hernia. Coronary artery calcifications are seen. Small bilateral pleural effusions are noted. Patchy ground-glass opacities are seen at both lung bases. Hepatobiliary: Although limited due to the lack of intravenous contrast, normal in appearance without gross focal abnormality. No evidence of calcified gallstones or biliary ductal dilatation. Pancreas:  Unremarkable.  No surrounding inflammatory changes. Spleen: Normal in size. Although limited due to the lack of intravenous contrast, normal in appearance. Adrenals/Urinary Tract: Both adrenal glands appear normal. Bilateral renal atrophy is seen. No hydronephrosis. No renal or collecting system calculi. Foley catheter balloon is seen again inflated within the prostatic urethra. The tip appears to be just projecting into the partially decompressed bladder. There is a small amount of air seen at the vesicoureteral junction. There is contrast seen within the partially decompressed bladder with diffuse wall thickening and calcifications. Small foci of air seen within the bladder dome with a fistulous tract extending to the left anterior abdominal wall collection. Stomach/Bowel: The stomach, small bowel, and colon are normal in appearance. No inflammatory changes or obstructive findings. Scattered colonic diverticula are noted. There is a moderate to large amount of colonic stool. Vascular/Lymphatic: There are no enlarged abdominal or pelvic lymph nodes. Scattered aortic atherosclerotic calcifications are seen without aneurysmal dilatation. Reproductive: Enlarged heterogeneous prostate gland. Other: A left lower anterior abdominal wall pigtail catheter is seen. There is a residual  small collection measuring 2.9 cm which now appears to have a small amount of contrast seen within it. There are small foci of air at the dome of the bladder with a possible fistulous tract between the collection and the dome of the bladder, series 2, image 78. Musculoskeletal: No acute or significant osseous findings. IMPRESSION: 1. Small bilateral pleural effusions with adjacent patchy airspace consolidation, consistent with COVID pneumonia. 2. Foley catheter balloon dilated within the prosthetic urethra as on prior exam. 3. Contrast within a chronically thickened partially decompressed bladder. 4. Left lower anterior abdominal pigtail catheter within a 2.9 cm collection that now contains contrast, there appears to be a small fistulous tract extending to the bladder dome where there are small foci of air as described above. 5. Diverticulosis without diverticulitis. 6.  Aortic Atherosclerosis (ICD10-I70.0). Electronically Signed   By: Prudencio Pair M.D.   On: 01/15/2020 05:14   DG Chest 1 View  Result Date: 12/30/2019 CLINICAL  DATA:  Dyspnea EXAM: CHEST  1 VIEW COMPARISON:  07/21/2013 FINDINGS: Cardiac shadow is stable. Postsurgical changes are again noted. Lungs are well aerated bilaterally. Chronic blunting of the left costophrenic angle is noted. Some patchy airspace opacity is noted in the right mid lung laterally which may represent some early infiltrate. No other focal abnormality is noted. IMPRESSION: Patchy airspace opacity in the right mid lung. Electronically Signed   By: Inez Catalina M.D.   On: 12/30/2019 12:57   CT ANGIO CHEST PE W OR WO CONTRAST  Result Date: 01/14/2020 CLINICAL DATA:  COVID-19 positive. Tachypnea. Hypoxia. Inpatient. Lower abdominal abscess status post percutaneous drainage on 01/19/2020. EXAM: CT ANGIOGRAPHY CHEST CT ABDOMEN AND PELVIS WITH CONTRAST TECHNIQUE: Multidetector CT imaging of the chest was performed using the standard protocol during bolus administration of  intravenous contrast. Multiplanar CT image reconstructions and MIPs were obtained to evaluate the vascular anatomy. Multidetector CT imaging of the abdomen and pelvis was performed using the standard protocol during bolus administration of intravenous contrast. CONTRAST:  148mL OMNIPAQUE IOHEXOL 350 MG/ML SOLN COMPARISON:  01/01/2020 CT abdomen/pelvis. 12/30/2019 chest radiograph. FINDINGS: CTA CHEST FINDINGS Cardiovascular: The study is low-to-moderate quality for the evaluation of pulmonary embolism, with substantial motion degradation. There are no convincing filling defects in the central, lobar, segmental or subsegmental pulmonary artery branches to suggest acute pulmonary embolism. Atherosclerotic nonaneurysmal thoracic aorta. Dilated main pulmonary artery (3.6 cm diameter). Top-normal heart size. No significant pericardial fluid/thickening. Left anterior descending and right coronary atherosclerosis. Mediastinum/Nodes: Hypodense bilateral thyroid nodules, largest 2.4 cm on the left. Unremarkable esophagus. No pathologically enlarged axillary, mediastinal or hilar lymph nodes. Lungs/Pleura: No pneumothorax. Small dependent bilateral pleural effusions. Extensive patchy consolidation and ground-glass opacity in the left greater than right lungs involving all lung lobes. No lung masses or discrete pulmonary nodules on these motion degraded images. Musculoskeletal: No aggressive appearing focal osseous lesions. Intact sternotomy wires. Moderate thoracic spondylosis. Review of the MIP images confirms the above findings. CT ABDOMEN and PELVIS FINDINGS Hepatobiliary: Normal liver with no liver mass. Normal gallbladder with no radiopaque cholelithiasis. No biliary ductal dilatation. Pancreas: Normal, with no mass or duct dilation. Spleen: Normal size. No mass. Adrenals/Urinary Tract: Normal right adrenal. Left adrenal 2.0 cm nodule with density 15 HU, unchanged using similar measurement technique in the short  interval. No hydronephrosis. Scattered subcentimeter hypodense renal cortical lesions in both kidneys are too small to characterize and are unchanged. Hypodense exophytic 1.3 cm renal cortical lesion in the posterior lower left kidney (series 505/image 44), stable. Normal caliber ureters. Decompressed urinary bladder with chronic diffuse bladder wall thickening. Nonspecific gas within nondependent bladder lumen is decreased. Foley catheter balloon inflated within the prostatic urethra with Foley tip at the vesicourethral junction. Stomach/Bowel: Normal non-distended stomach. Left-sided percutaneous pigtail drain terminates in the anterior midline lower peritoneal cavity with nearly resolved fluid collection in this location, now measuring 2.6 x 0.7 cm (series 505/image 66), previously 5.3 x 2.6 cm. No residual gas within this collection. Tip of drain remains in close proximity to the dome of the bladder. Fat stranding surrounding this collection has decreased. Normal caliber small bowel loops with no small bowel wall thickening. Appendix not discretely visualized. Scattered mild colonic diverticulosis with no large bowel wall thickening or acute pericolonic fat stranding. Vascular/Lymphatic: Atherosclerotic nonaneurysmal abdominal aorta. Patent portal, splenic, hepatic and renal veins. No pathologically enlarged lymph nodes in the abdomen or pelvis. Reproductive: Marked prostatomegaly. Other: No pneumoperitoneum. No ascites. No new focal fluid collections. Musculoskeletal: No  aggressive appearing focal osseous lesions. Marked lumbar spondylosis. Review of the MIP images confirms the above findings. IMPRESSION: 1. Limited motion degraded scan with no evidence of acute pulmonary embolism. 2. Extensive patchy consolidation and ground-glass opacity in the left greater than right lungs compatible with multilobar pneumonia due to reported COVID-19. 3. Small dependent bilateral pleural effusions. 4. Dilated main pulmonary  artery, suggesting pulmonary arterial hypertension. 5. Near complete resolution of anterior midline lower peritoneal cavity fluid collection status post percutaneous drainage. Tip of percutaneous drain remains in close proximity to the dome of the bladder, with fistulization of this residual collection with the bladder lumen not excluded on the basis of this scan. Decreased gas within the nondependent bladder. 6. Foley catheter balloon is still dilated within the prostatic urethra with Foley tip at the vesicourethral junction. Recommend repositioning/replacement. Bladder is decompressed. No hydronephrosis. 7. Chronic mild diffuse bladder wall thickening is nonspecific and probably due to chronic bladder outlet obstruction by the markedly enlarged prostate. 8. Stable left adrenal nodule, probably an adenoma, for which follow-up adrenal protocol CT abdomen without and with IV contrast may be considered in 12 months. 9. Mild scattered colonic diverticulosis. 10.  Aortic Atherosclerosis (ICD10-I70.0). Electronically Signed   By: Ilona Sorrel M.D.   On: 01/14/2020 15:07   CT ABDOMEN PELVIS W CONTRAST  Result Date: 01/14/2020 CLINICAL DATA:  COVID-19 positive. Tachypnea. Hypoxia. Inpatient. Lower abdominal abscess status post percutaneous drainage on 01/16/2020. EXAM: CT ANGIOGRAPHY CHEST CT ABDOMEN AND PELVIS WITH CONTRAST TECHNIQUE: Multidetector CT imaging of the chest was performed using the standard protocol during bolus administration of intravenous contrast. Multiplanar CT image reconstructions and MIPs were obtained to evaluate the vascular anatomy. Multidetector CT imaging of the abdomen and pelvis was performed using the standard protocol during bolus administration of intravenous contrast. CONTRAST:  150mL OMNIPAQUE IOHEXOL 350 MG/ML SOLN COMPARISON:  01/22/2020 CT abdomen/pelvis. 12/30/2019 chest radiograph. FINDINGS: CTA CHEST FINDINGS Cardiovascular: The study is low-to-moderate quality for the  evaluation of pulmonary embolism, with substantial motion degradation. There are no convincing filling defects in the central, lobar, segmental or subsegmental pulmonary artery branches to suggest acute pulmonary embolism. Atherosclerotic nonaneurysmal thoracic aorta. Dilated main pulmonary artery (3.6 cm diameter). Top-normal heart size. No significant pericardial fluid/thickening. Left anterior descending and right coronary atherosclerosis. Mediastinum/Nodes: Hypodense bilateral thyroid nodules, largest 2.4 cm on the left. Unremarkable esophagus. No pathologically enlarged axillary, mediastinal or hilar lymph nodes. Lungs/Pleura: No pneumothorax. Small dependent bilateral pleural effusions. Extensive patchy consolidation and ground-glass opacity in the left greater than right lungs involving all lung lobes. No lung masses or discrete pulmonary nodules on these motion degraded images. Musculoskeletal: No aggressive appearing focal osseous lesions. Intact sternotomy wires. Moderate thoracic spondylosis. Review of the MIP images confirms the above findings. CT ABDOMEN and PELVIS FINDINGS Hepatobiliary: Normal liver with no liver mass. Normal gallbladder with no radiopaque cholelithiasis. No biliary ductal dilatation. Pancreas: Normal, with no mass or duct dilation. Spleen: Normal size. No mass. Adrenals/Urinary Tract: Normal right adrenal. Left adrenal 2.0 cm nodule with density 15 HU, unchanged using similar measurement technique in the short interval. No hydronephrosis. Scattered subcentimeter hypodense renal cortical lesions in both kidneys are too small to characterize and are unchanged. Hypodense exophytic 1.3 cm renal cortical lesion in the posterior lower left kidney (series 505/image 44), stable. Normal caliber ureters. Decompressed urinary bladder with chronic diffuse bladder wall thickening. Nonspecific gas within nondependent bladder lumen is decreased. Foley catheter balloon inflated within the prostatic  urethra with Foley tip at  the vesicourethral junction. Stomach/Bowel: Normal non-distended stomach. Left-sided percutaneous pigtail drain terminates in the anterior midline lower peritoneal cavity with nearly resolved fluid collection in this location, now measuring 2.6 x 0.7 cm (series 505/image 66), previously 5.3 x 2.6 cm. No residual gas within this collection. Tip of drain remains in close proximity to the dome of the bladder. Fat stranding surrounding this collection has decreased. Normal caliber small bowel loops with no small bowel wall thickening. Appendix not discretely visualized. Scattered mild colonic diverticulosis with no large bowel wall thickening or acute pericolonic fat stranding. Vascular/Lymphatic: Atherosclerotic nonaneurysmal abdominal aorta. Patent portal, splenic, hepatic and renal veins. No pathologically enlarged lymph nodes in the abdomen or pelvis. Reproductive: Marked prostatomegaly. Other: No pneumoperitoneum. No ascites. No new focal fluid collections. Musculoskeletal: No aggressive appearing focal osseous lesions. Marked lumbar spondylosis. Review of the MIP images confirms the above findings. IMPRESSION: 1. Limited motion degraded scan with no evidence of acute pulmonary embolism. 2. Extensive patchy consolidation and ground-glass opacity in the left greater than right lungs compatible with multilobar pneumonia due to reported COVID-19. 3. Small dependent bilateral pleural effusions. 4. Dilated main pulmonary artery, suggesting pulmonary arterial hypertension. 5. Near complete resolution of anterior midline lower peritoneal cavity fluid collection status post percutaneous drainage. Tip of percutaneous drain remains in close proximity to the dome of the bladder, with fistulization of this residual collection with the bladder lumen not excluded on the basis of this scan. Decreased gas within the nondependent bladder. 6. Foley catheter balloon is still dilated within the prostatic  urethra with Foley tip at the vesicourethral junction. Recommend repositioning/replacement. Bladder is decompressed. No hydronephrosis. 7. Chronic mild diffuse bladder wall thickening is nonspecific and probably due to chronic bladder outlet obstruction by the markedly enlarged prostate. 8. Stable left adrenal nodule, probably an adenoma, for which follow-up adrenal protocol CT abdomen without and with IV contrast may be considered in 12 months. 9. Mild scattered colonic diverticulosis. 10.  Aortic Atherosclerosis (ICD10-I70.0). Electronically Signed   By: Ilona Sorrel M.D.   On: 01/14/2020 15:07   CT ABDOMEN PELVIS W CONTRAST  Result Date: 01/03/2020 CLINICAL DATA:  Unspecified abdominal pain, COVID-19 positive EXAM: CT ABDOMEN AND PELVIS WITH CONTRAST TECHNIQUE: Multidetector CT imaging of the abdomen and pelvis was performed using the standard protocol following bolus administration of intravenous contrast. CONTRAST:  137mL OMNIPAQUE IOHEXOL 300 MG/ML  SOLN COMPARISON:  None. FINDINGS: Lower chest: Mixed ground-glass, consolidative and reticular opacities are present in the lower lobes, right middle lobe and lingula compatible with atypical infection in the setting of COVID-19 positivity. Mild airways thickening is present as well. Hepatobiliary: No focal liver abnormality is seen. No gallstones, gallbladder wall thickening, or biliary dilatation. Pancreas: Fatty replacement of the pancreas. No pancreatic ductal dilatation or surrounding inflammatory changes. Spleen: Normal in size without focal abnormality. 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 No worrisome adrenal lesions. Mild bilateral symmetric perinephric stranding, a nonspecific finding though may correlate with either age or decreased renal function. Kidneys enhance and excrete symmetrically. Some mild left urothelial thickening and  periureteral stranding is present. The urinary bladder is circumferentially thickened with multiple foci intraluminal gas. Several punctate foci of gas are seen within the wall of the bladder. These could feasibly reflect gas within small bladder diverticula or crenulation given an enlarged prostate and likely sequela of chronic outlet obstruction however emphysematous cystitis is not fully excluded.  Furthermore, there is focal discontinuity along the dome of the bladder contiguous with a an irregular air and fluid containing collection in the midline abdomen closely apposed to several loops of thickened and enhancing small bowel. An enterovesicular fistula is suspected. Stomach/Bowel: Distal esophagus, stomach and duodenal sweep are unremarkable. There are few focally thickened segments of small bowel with irregular mural enhancement and edematous change in the low anterior mid abdomen centered upon a rim enhancing 5.2 x 2.6 x 7.7cm air and fluid containing collection worrisome for developing abscess or potential enterovesicular fistula given direct continuity the dome of the bladder as detailed above. More distal small bowel has a normal appearance. Appendix is not well visualized. No focal pericecal inflammation is seen. Pancolonic diverticulosis without evidence of acute diverticulitis. Vascular/Lymphatic: Extensive atherosclerotic calcification of the abdominal aorta, iliac arteries and branch vessels. No aneurysm or ectasia is seen. Reactive adenopathy in the low mesentery with some geographic mesenteric stranding involving the affected bowel loops detailed above. Reproductive: The prostate is markedly enlarged. A Foley catheter appears inflated in the low prosthetic urethra. Some focal hypoattenuating cystic foci within the prostate could reflect possible abscess given the adjacent inflammation and stranding. Other: Phlegmonous change in the mid mesentery with the air and fluid containing collection are  arising from the dome of the bladder and or bowel loops. No free intraperitoneal air is seen. No bowel containing hernias. Musculoskeletal: Atrophy of the anterior abdominal wall musculature Multilevel degenerative changes are present in the imaged portions of the spine. No acute osseous abnormality or suspicious osseous lesion. Prior sternotomy changes are noted. Additional degenerative changes noted in the hips and SI joints bilaterally as well as the symphysis pubis. IMPRESSION: 1. 5.2 x 2.6 x 7.7cm rim enhancing air and fluid containing collection in the low anterior mid abdomen. Collection is directly contiguous with the inflamed bladder dome with several adjacent loops of edematous and thickened small bowel. Overall appearance is worrisome for an abscess with possible enterovesicular fistulization. 2. Bladder itself is circumferentially thickened with gas in the bladder wall. While these foci of air could be within granulations of the bladder given an enlarged prostate and likely some sequela of chronic outlet obstruction, given the patient's extreme white count features are highly suspicious for an emphysematous cystitis. 3. Inflated Foley catheter balloon is positioned within the prostatic urethra. Recommend repositioning or removal. 4. Rim enhancing low attenuation collection within the prostate parenchyma worrisome for potential abscess. 5. Mixed ground-glass, consolidative and reticular opacities in the lower lobes, right middle lobe and lingula compatible with atypical infection in the setting of COVID positivity positivity. 6. Colonic diverticulosis without evidence of diverticulitis. 7. 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 8.  Aortic Atherosclerosis (ICD10-I70.0). These results were called by telephone at the time of interpretation on 01/13/2020 at 4:46 am to provider [facility] , who verbally acknowledged these results. Electronically Signed   By: Lovena Le  M.D.   On: 01/06/2020 04:47   CT IMAGE GUIDED DRAINAGE BY PERCUTANEOUS CATHETER  Result Date: 01/11/2020 INDICATION: Abdominal abscess with concern for enteric vesicular fistula. Please perform CT-guided percutaneous drainage catheter placement for infection source control purposes. EXAM: CT IMAGE GUIDED DRAINAGE BY PERCUTANEOUS CATHETER COMPARISON:  CT abdomen pelvis-01/03/2020 MEDICATIONS: The patient is currently admitted to the hospital and receiving intravenous antibiotics. The antibiotics were administered within an appropriate time frame prior to the initiation of the procedure. ANESTHESIA/SEDATION: Moderate (conscious) sedation was employed during this procedure. A  total of Versed 0.5 mg and Fentanyl 50 mcg was administered intravenously. Moderate Sedation Time: 11 minutes. The patient's level of consciousness and vital signs were monitored continuously by radiology nursing throughout the procedure under my direct supervision. CONTRAST:  None COMPLICATIONS: None immediate. PROCEDURE: Informed written consent was obtained from the patient after a discussion of the risks, benefits and alternatives to treatment. The patient was placed supine on the CT gantry and a pre procedural CT was performed re-demonstrating the known abscess/fluid collection within the ventral aspect the lower abdomen with dominant mixed air in fluid containing component measuring approximately 5.0 x 1.9 cm (image 21, series 2). The procedure was planned. A timeout was performed prior to the initiation of the procedure. The skin overlying the ventral aspect of the lower abdomen/pelvis was prepped and draped in the usual sterile fashion. The overlying soft tissues were anesthetized with 1% lidocaine with epinephrine. Appropriate trajectory was planned with the use of a 22 gauge spinal needle. An 18 gauge trocar needle was advanced into the abscess/fluid collection and a short Amplatz super stiff wire was coiled within the collection.  Appropriate positioning was confirmed with a limited CT scan. The tract was serially dilated allowing placement of a 10 [place] all-purpose drainage catheter. Appropriate positioning was confirmed with a limited postprocedural CT scan. Approximately 10 ml of blood-tinged serous fluid was aspirated. The tube was connected to a JP bulb and sutured in place. A dressing was placed. The patient tolerated the procedure well without immediate post procedural complication. IMPRESSION: Successful CT guided placement of a 10 French all purpose drain catheter into the midline of the lower abdomen/pelvis with aspiration of 10 mL of blood-tinged serous fluid. Samples were sent to the laboratory as requested by the ordering clinical team. Electronically Signed   By: Sandi Mariscal M.D.   On: 01/11/2020 08:24    Assessment & Plan    1. Atrial flutter   -patient has been taken off of amiodarone and currently is still in sinus rhythm.  We will continue with metoprolol succinate at 25 mg daily and we will continue with Eliquis at 2.5 mg twice daily following for bleeding.  2. Coronary artery disease   -No obvious progression clinically.  No further invasive or noninvasive ischemic work-up indicated at present.  3. Hypertension   -Vital signs this a.m. 155/91 with a pulse rate of 57.  Will increase amlodipine to 10 mg daily.  Will attempt to remain at this dose for several days to allow for implementation.  Poor prognosis.  We will follow with you.   Signed, Javier Docker Fath MD 03-Feb-2020, 7:52 AM  Pager: [phone]

## 2020-02-01 NOTE — Progress Notes (Signed)
   February 10, 2020 1700  Clinical Encounter Type  Visited With Patient and family together  Visit Type Initial  Referral From Nurse  Consult/Referral To Chaplain  Stress Factors  Family Stress Factors Major life changes  Chaplain visited with patient, who is unresponsive and daughter [name]) and son-in-law [name]). [name] was holding the telephone to patient's ear while another family talked to him. Both [name] and [name] while teary eyed. They need to leave and come back but are hesitant. [name] mentioned that her mother who is 83 yrs old is home alone. Chaplain told them that she would check in on patient throughout the night and she was glad to hear that. [name] asked if it appeared that she needs to come back for the nurses to call her remembering that she is one hour and a half away. Chaplain offered pastoral presence, empathy, and prayer.

## 2020-02-01 NOTE — Consult Note (Addendum)
Consultation Note Date: 02/04/2020   Patient Name: Jimmy Moore  DOB: January 03, 1929  MRN: [id number]  Age / Sex: 86 y.o., male  PCP: Venia Carbon, MD Referring Physician: Max Sane, MD  Reason for Consultation: Establishing goals of care  HPI/Patient Profile: 86 y.o. male  with past medical history of afib on Eliquis, PVD, HLD, diastolic CHF, BPH, CAD s/p CABG admitted on 01/06/2020 with abdominal pain. Recent hospitalization 12/30/19-01/03/20 with COVID-19 pneumonia. 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 Urology and ID following s/p percutaneous drain placement. Pt now growing out pseudomonas from urine as well as abscess. Worsening respiratory status. CTA negative for PE. CT chest 1/14 with extensive ground glass opacities on left compared to right--? Post covid fibrosis vs. CHF vs. Amiodarone pneumonitis. Pulmonology consulted and recommended steroids and discontinuing amiodarone. Palliative medicine consultation for goals of care.   Clinical Assessment and Goals of Care:  I have reviewed medical records, discussed with care team, and assessed the patient at bedside. He is lethargic, intermittently moaning and appears uncomfortable with increased work of breathing. He is requiring 15L HFNC and 15L NRB mask. He will not follow commands.  Daughter [name]) at bedside to discuss goals of care. Shortly after, Dr. Manuella Ghazi and son-in-law Johnsie Cancel) also at bedside for [place] discussion.   I introduced Palliative Medicine as specialized medical care for people living with serious illness. It focuses on providing relief from the symptoms and stress of a serious illness.  We discussed a brief life review of the patient. Prior to previous admission, patient was living at home with wife of 80 years. He was completely independent and able to  care for himself. They have 4 children, [name] is local but other children are out of state.   Discussed events leading up to admission. After last hospitalization, he was discharged to [facility] for rehab. He was only there 2 days before having to be re-admitted.  Discussed course of hospitalization including diagnoses, interventions, plan of care and recommendations from specialists. Dr. Manuella Ghazi and I frankly and compassionately discussed that he appears to be actively dying and poor prognosis, possibly hours or days with significant clinical decline today.   I attempted to elicit values and goals of care important to the patient/family. Advanced directives, concepts specific to code status, artifical feeding and hydration were discussed. [name] confirms the patient's decision for DNR code status. She shares that he has a documented living will and does not wish for "extraordinary means" if terminally ill. She shares that he would not wish to be dependent on others or bed bound. Discussed that pursuing intubation/mechanical ventilation and/or feeding tube would just prolong suffering at this point. [name] agrees and during visit, it appeared that patient nodded head 'no' when [name] and I were discussing ventilator.   The difference between aggressive medical intervention and comfort care was considered in light of the patient's goals of care. After further discussion, decision made to transition to comfort measures only, understanding poor prognosis.  Educated on transition to comfort measures to ensure comfort, peace, dignity at EOL. Discussed symptom management medications to ensure relief from suffering. Daughter/SIL agreeable to start low-dose morphine gtt for comfort. Discussed EOL expectations and visitor policy. Prepared them that he will likely pass in the hospital.   Questions and concerns were addressed.  Hard Choices booklet and PMT contact information given. Therapeutic listening.  Emotional/spiritual support provided.     SUMMARY OF RECOMMENDATIONS    Clinical decline. Patient actively dying. After GOC discussion with Dr Manuella Ghazi and family at bedside, decision made to transition to comfort measures only.   Discontinue interventions not aimed at comfort.  Symptom management--see below.  Comfort feeds per patient/family request. Aspiration precautions.   Frequent oral care and repositioning.   Unrestricted visitor access as patient is nearing EOL.  Chaplain consult.  Consider weaning down oxygen once patient comfortable on morphine infusion. Explained to family that he could pass with high oxygen requirements or this could also prolong the process.   Code Status/Advance Care Planning:  DNR  Symptom Management:   Morphine 0.5mg /hr continuous infusion  Morphine 1-2mg  bolus via infusion q39min prn pain/dyspnea/air hunger/tachypnea  Scopolamine patch  Robinul 0.2mg  IV q4h prn secretions  Ativan 1mg  IV q4h prn anxiety/agitation   Dulcolax suppository daily prn  Palliative Prophylaxis:   Aspiration, Delirium Protocol, Frequent Pain Assessment, Oral Care and Turn Reposition  Psycho-social/Spiritual:   Desire for further Chaplaincy support: yes  Additional Recommendations: Caregiving  Support/Resources, Compassionate Wean Education and Education on Hospice  Prognosis:   Poor prognosis likely hours-days with acute respiratory failure secondary to 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 Patient actively dying.   Discharge Planning: Anticipated Hospital Death      Primary Diagnoses: Present on Admission: . Chronic diastolic (congestive) heart failure ([place]) . Unspecified dementia without behavioral disturbance ([place]) . Hypertensive heart disease with heart failure  ([place])   I have reviewed the medical record, interviewed the patient and family, and examined the patient. The following aspects are pertinent.  Past Medical History:  Diagnosis Date  . Atherosclerotic heart disease of native coronary artery without angina pectoris   . Benign prostatic hyperplasia without lower urinary tract symptoms   . Chronic diastolic (congestive) heart failure ([place])   . Hyperlipidemia   . Hypertensive heart disease with heart failure ([place])   . Peripheral vascular disease ([place])   . Unspecified atrial fibrillation ([place])   . Unspecified dementia without behavioral disturbance ([place])    Social History   Socioeconomic History  . Marital status: Married    Spouse name: Not on file  . Number of children: Not on file  . Years of education: Not on file  . Highest education level: Not on file  Occupational History  . Not on file  Tobacco Use  . Smoking status: Former Research scientist (life sciences)  . Smokeless tobacco: Never Used  Substance and Sexual Activity  . Alcohol use: Not Currently  . Drug use: Never  . Sexual activity: Not on file  Other Topics Concern  . Not on file  Social History Narrative  . Not on file   Social Determinants of Health   Financial Resource Strain:   . Difficulty of Paying Living Expenses: Not on file  Food Insecurity:   . Worried About Charity fundraiser in the Last Year: Not on file  . Ran Out of Food  in the Last Year: Not on file  Transportation Needs:   . Lack of Transportation (Medical): Not on file  . Lack of Transportation (Non-Medical): Not on file  Physical Activity:   . Days of Exercise per Week: Not on file  . Minutes of Exercise per Session: Not on file  Stress:   . Feeling of Stress : Not on file  Social Connections:   . Frequency of Communication with Friends and Family: Not on file  . Frequency of Social Gatherings with Friends and Family: Not on file  . Attends Religious Services: Not on file  . Active Member of Clubs or  Organizations: Not on file  . Attends Archivist Meetings: Not on file  . Marital Status: Not on file   History reviewed. No pertinent family history. Scheduled Meds: . scopolamine  1 patch Transdermal Q72H  . sodium chloride flush  10-40 mL Intracatheter Q12H  . sodium chloride flush  5 mL Intracatheter Q8H   Continuous Infusions: . sodium chloride 250 mL (01/19/20 2215)  . morphine 0.5 mg/hr (01-30-20 1531)   PRN Meds:.sodium chloride, acetaminophen **OR** acetaminophen, bisacodyl, glycopyrrolate, guaiFENesin-codeine, LORazepam, morphine, sodium chloride flush Medications Prior to Admission:  Prior to Admission medications   Medication Sig Start Date End Date Taking? Authorizing Provider  acetaminophen (TYLENOL) 325 MG tablet Take 650 mg by mouth every 4 (four) hours as needed for mild pain or fever.   Yes [provider]  albuterol (VENTOLIN HFA) 108 (90 Base) MCG/ACT inhaler Inhale 1 puff into the lungs every 12 (twelve) hours as needed for wheezing or shortness of breath.   Yes [provider]  amiodarone (PACERONE) 100 MG tablet Take 100 mg by mouth daily.   Yes [provider]  amLODipine (NORVASC) 5 MG tablet Take 5 mg by mouth daily.   Yes [provider]  apixaban (ELIQUIS) 2.5 MG TABS tablet Take 2.5 mg by mouth 2 (two) times daily.   Yes [provider]  dextromethorphan-guaiFENesin (TUSSIN DM) 10-100 MG/5ML liquid Take 10 mLs by mouth every 4 (four) hours as needed for cough.   Yes [provider]  feeding supplement, ENSURE ENLIVE, (ENSURE ENLIVE) LIQD Take 237 mLs by mouth 3 (three) times daily between meals. 01/03/20  Yes Danford, Suann Larry, MD  finasteride (PROSCAR) 5 MG tablet Take 1 tablet (5 mg total) by mouth daily. 01/03/20  Yes Danford, Suann Larry, MD  gabapentin (NEURONTIN) 100 MG capsule Take 100 mg by mouth 3 (three) times daily.   Yes [provider]  levETIRAcetam (KEPPRA) 250 MG tablet  Take 500 mg by mouth every evening.   Yes [provider]  levETIRAcetam (KEPPRA) 250 MG tablet Take 250 mg by mouth daily.   Yes [provider]  losartan (COZAAR) 100 MG tablet Take 100 mg by mouth daily.   Yes [provider]  metoprolol succinate (TOPROL-XL) 25 MG 24 hr tablet Take 25 mg by mouth daily.   Yes [provider]  omeprazole (PRILOSEC) 20 MG capsule Take 20 mg by mouth at bedtime.   Yes [provider]  polyethylene glycol (MIRALAX / GLYCOLAX) 17 g packet Take 17 g by mouth every other day.   Yes [provider]  tamsulosin (FLOMAX) 0.4 MG CAPS capsule Take 0.4 mg by mouth daily.   Yes [provider]  traMADol (ULTRAM) 50 MG tablet Take 1 tablet (50 mg total) by mouth every 6 (six) hours as needed for moderate pain. 01/03/20  Yes  Danford, Suann Larry, MD   No Known Allergies Review of Systems  Unable to perform ROS: Acuity of condition   Physical Exam Vitals and nursing note reviewed.  Constitutional:      Appearance: He is ill-appearing.  HENT:     Head: Normocephalic and atraumatic.  Cardiovascular:     Rate and Rhythm: Rhythm irregularly irregular.  Pulmonary:     Effort: Tachypnea and accessory muscle usage present.     Breath sounds: Decreased breath sounds and rhonchi present.     Comments: 15L HFNC and 15L NRB mask. RN to initiate morphine infusion. Audible secretions. Abdominal:     Tenderness: There is no abdominal tenderness.  Skin:    General: Skin is warm and dry.  Neurological:     Mental Status: He is lethargic.     Comments: Lethargic, moaning, not following commands    Vital Signs: BP (!) 156/96 (BP Location: Left Arm)   Pulse 86   Temp 98.5 F (36.9 C)   Resp (!) 24   Ht 6\' 4"  (1.93 m)   Wt 85.6 kg   SpO2 98%   BMI 22.97 kg/m  Pain Scale: Faces POSS *See Group Information*: S-Acceptable,Sleep, easy to arouse Pain Score: 0-No pain   SpO2: SpO2: 98 % O2 Device:SpO2: 98  % O2 Flow Rate: .O2 Flow Rate (L/min): 15 L/min  IO: Intake/output summary:   Intake/Output Summary (Last 24 hours) at 02/11/2020 1615 Last data filed at Feb 11, 2020 1531 Gross per 24 hour  Intake 1852.48 ml  Output 615 ml  Net 1237.48 ml    LBM: Last BM Date: 01/15/20 Baseline Weight: Weight: 87.9 kg Most recent weight: Weight: 85.6 kg     Palliative Assessment/Data: PPS 10%   Flowsheet Rows     Most Recent Value  Intake Tab  Referral Department  Hospitalist  Unit at Time of Referral  Cardiac/Telemetry Unit  Palliative Care Primary Diagnosis  Other (Comment)  Palliative Care Type  New Palliative care  Reason for referral  Clarify Goals of Care  Date first seen by Palliative Care  February 11, 2020  Clinical Assessment  Palliative Performance Scale Score  10%  Psychosocial & Spiritual Assessment  Palliative Care Outcomes  Patient/Family meeting held?  Yes  Who was at the meeting?  daughter, SIL, Dr Manuella Ghazi  Palliative Care Outcomes  Clarified goals of care, Improved pain interventions, Improved non-pain symptom therapy, Counseled regarding hospice, Provided end of life care assistance, Provided psychosocial or spiritual support, Changed to focus on comfort, ACP counseling assistance      Time In/Out: 1250-1330, 1414-1500 Time Total: 70min Greater than 50%  of this time was spent counseling and coordinating care related to the above assessment and plan.  Signed by:  Ihor Dow, DNP, FNP-C Palliative Medicine Team  Phone: [phone] Fax: [phone]   Please contact Palliative Medicine Team phone at [phone] for questions and concerns.  For individual provider: See Shea Evans

## 2020-02-01 NOTE — Death Summary Note (Signed)
DEATH SUMMARY   Patient Details  Name: Corydon Kissner MRN: [id number] DOB: [birth year]  Admission/Discharge Information   Admit Date:  05-Feb-2020  Date of Death: Date of Death: 18-Feb-2020  Time of Death: Time of Death: 04/21/1944  Length of Stay: 2023/04/10  Referring Physician: Venia Carbon, MD   Reason(s) for Hospitalization  Abdominal pain Diagnoses  Preliminary cause of death: Intra-abdominal abscess Secondary Diagnoses (including complications and co-morbidities):  Principal Problem:   Postprocedural intraabdominal abscess Active Problems:   Hypertensive heart disease with heart failure (HCC)   Unspecified atrial fibrillation ([place])   Unspecified dementia without behavioral disturbance (HCC)   Chronic diastolic (congestive) heart failure (HCC)   Emphysematous cystitis   History of COVID-19   Atrial flutter with rapid ventricular response ([place])   Palliative care by specialist   Terminal care   Prostate abscess   Acute respiratory failure with hypoxia ([place])   Intra-abdominal fluid collection   Agitation   Brief Hospital Course (including significant findings, care, treatment, and services provided and events leading to death)  Amiri Lannigan is a 86 y.o. year old male with history of afib on Eliquis, PVD, HLD, diastolic CHF, BPH, CAD s/p CABG admitted on 2020-02-05 with abdominal pain. Recent hospitalization 12/30/19-01/03/20 with COVID-19 pneumonia.  ED Course: On arrival in the emergency room he was afebrile with a temperature of 98.3, blood pressure 152/76 heart rate 92 respirations 20 with O2 sat 90% on room air.  His blood work was notable for white cell count of 56,000.  Hemoglobin was 11.  His chemistries were mostly unremarkable.  Urinalysis showed large leukocyte esterase negative nitrites.  CT abdomen and pelvis significant for abdominal abscess contiguous with an inflamed bladder dome with possible enterovesicular fistulization.  Also showed solid dative and reticular opacities in the  lower lobes right middle lobe and lingula compatible with atypical infection.  Patient was started on IV Zosyn Flagyl and vancomycin.   Urology, surgery and ID recommended IR drainage of abscess as he was a poor surgical candidate.  Patient underwent percutaneous drain placement under CT guidance on 02/04/23. Pt grew pseudomonas from urine as well as abscess. Worsening respiratory status. CTA negative for PE. CT chest 1/14 with extensive ground glass opacities on left compared to right--? Post covid fibrosis vs. CHF vs. Amiodarone pneumonitis.  Palliative care was consulted and after long discussion with family by myself and palliative care nurse practitioner patient was made comfort care.  He passed peacefully.  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 at another hospital Emphysematous cystitis with possible prostate abscess Acute Hypoxic Respiratory Failure with hypoxia - in setting of recent Covid PNA Moderate Protein calorie malnutrition Hypokalemia  Hypernatremia  Iron Deficiency Anemia  Atrial Flutter with RVR Chronic A-Fib History of COVID-19pneumonia Marked leukocytosis Coronary disease, secondary prevention Hypertension Chronic diastolic CHF,potentially with decompensation contributing to degree of hypoxia BPH GERD Seizure disorder Dementia without behavioral disturbance Pertinent Labs and Studies  Significant Diagnostic Studies CT ABDOMEN PELVIS WO CONTRAST  Result Date: 01/15/2020 CLINICAL DATA:  Urinary catheter displacement, unable to determine output EXAM: CT ABDOMEN AND PELVIS WITHOUT CONTRAST TECHNIQUE: Multidetector CT imaging of the abdomen and pelvis was performed following the standard protocol without IV contrast. COMPARISON:  January 14, 2020 FINDINGS: Lower chest: The visualized heart size within normal limits. No pericardial fluid/thickening. No hiatal hernia. Coronary artery calcifications are seen. Small  bilateral pleural effusions are noted. Patchy ground-glass opacities are seen at both lung bases. Hepatobiliary: Although limited  due to the lack of intravenous contrast, normal in appearance without gross focal abnormality. No evidence of calcified gallstones or biliary ductal dilatation. Pancreas:  Unremarkable.  No surrounding inflammatory changes. Spleen: Normal in size. Although limited due to the lack of intravenous contrast, normal in appearance. Adrenals/Urinary Tract: Both adrenal glands appear normal. Bilateral renal atrophy is seen. No hydronephrosis. No renal or collecting system calculi. Foley catheter balloon is seen again inflated within the prostatic urethra. The tip appears to be just projecting into the partially decompressed bladder. There is a small amount of air seen at the vesicoureteral junction. There is contrast seen within the partially decompressed bladder with diffuse wall thickening and calcifications. Small foci of air seen within the bladder dome with a fistulous tract extending to the left anterior abdominal wall collection. Stomach/Bowel: The stomach, small bowel, and colon are normal in appearance. No inflammatory changes or obstructive findings. Scattered colonic diverticula are noted. There is a moderate to large amount of colonic stool. Vascular/Lymphatic: There are no enlarged abdominal or pelvic lymph nodes. Scattered aortic atherosclerotic calcifications are seen without aneurysmal dilatation. Reproductive: Enlarged heterogeneous prostate gland. Other: A left lower anterior abdominal wall pigtail catheter is seen. There is a residual small collection measuring 2.9 cm which now appears to have a small amount of contrast seen within it. There are small foci of air at the dome of the bladder with a possible fistulous tract between the collection and the dome of the bladder, series 2, image 78. Musculoskeletal: No acute or significant osseous findings. IMPRESSION: 1. Small  bilateral pleural effusions with adjacent patchy airspace consolidation, consistent with COVID pneumonia. 2. Foley catheter balloon dilated within the prosthetic urethra as on prior exam. 3. Contrast within a chronically thickened partially decompressed bladder. 4. Left lower anterior abdominal pigtail catheter within a 2.9 cm collection that now contains contrast, there appears to be a small fistulous tract extending to the bladder dome where there are small foci of air as described above. 5. Diverticulosis without diverticulitis. 6.  Aortic Atherosclerosis (ICD10-I70.0). Electronically Signed   By: Prudencio Pair M.D.   On: 01/15/2020 05:14   DG Chest 1 View  Result Date: 12/30/2019 CLINICAL DATA:  Dyspnea EXAM: CHEST  1 VIEW COMPARISON:  07/21/2013 FINDINGS: Cardiac shadow is stable. Postsurgical changes are again noted. Lungs are well aerated bilaterally. Chronic blunting of the left costophrenic angle is noted. Some patchy airspace opacity is noted in the right mid lung laterally which may represent some early infiltrate. No other focal abnormality is noted. IMPRESSION: Patchy airspace opacity in the right mid lung. Electronically Signed   By: Inez Catalina M.D.   On: 12/30/2019 12:57   CT ANGIO CHEST PE W OR WO CONTRAST  Result Date: 01/14/2020 CLINICAL DATA:  COVID-19 positive. Tachypnea. Hypoxia. Inpatient. Lower abdominal abscess status post percutaneous drainage on 01/28/2020. EXAM: CT ANGIOGRAPHY CHEST CT ABDOMEN AND PELVIS WITH CONTRAST TECHNIQUE: Multidetector CT imaging of the chest was performed using the standard protocol during bolus administration of intravenous contrast. Multiplanar CT image reconstructions and MIPs were obtained to evaluate the vascular anatomy. Multidetector CT imaging of the abdomen and pelvis was performed using the standard protocol during bolus administration of intravenous contrast. CONTRAST:  114mL OMNIPAQUE IOHEXOL 350 MG/ML SOLN COMPARISON:  01/15/2020 CT  abdomen/pelvis. 12/30/2019 chest radiograph. FINDINGS: CTA CHEST FINDINGS Cardiovascular: The study is low-to-moderate quality for the evaluation of pulmonary embolism, with substantial motion degradation. There are no convincing filling defects in the central, lobar, segmental or subsegmental  pulmonary artery branches to suggest acute pulmonary embolism. Atherosclerotic nonaneurysmal thoracic aorta. Dilated main pulmonary artery (3.6 cm diameter). Top-normal heart size. No significant pericardial fluid/thickening. Left anterior descending and right coronary atherosclerosis. Mediastinum/Nodes: Hypodense bilateral thyroid nodules, largest 2.4 cm on the left. Unremarkable esophagus. No pathologically enlarged axillary, mediastinal or hilar lymph nodes. Lungs/Pleura: No pneumothorax. Small dependent bilateral pleural effusions. Extensive patchy consolidation and ground-glass opacity in the left greater than right lungs involving all lung lobes. No lung masses or discrete pulmonary nodules on these motion degraded images. Musculoskeletal: No aggressive appearing focal osseous lesions. Intact sternotomy wires. Moderate thoracic spondylosis. Review of the MIP images confirms the above findings. CT ABDOMEN and PELVIS FINDINGS Hepatobiliary: Normal liver with no liver mass. Normal gallbladder with no radiopaque cholelithiasis. No biliary ductal dilatation. Pancreas: Normal, with no mass or duct dilation. Spleen: Normal size. No mass. Adrenals/Urinary Tract: Normal right adrenal. Left adrenal 2.0 cm nodule with density 15 HU, unchanged using similar measurement technique in the short interval. No hydronephrosis. Scattered subcentimeter hypodense renal cortical lesions in both kidneys are too small to characterize and are unchanged. Hypodense exophytic 1.3 cm renal cortical lesion in the posterior lower left kidney (series 505/image 44), stable. Normal caliber ureters. Decompressed urinary bladder with chronic diffuse bladder  wall thickening. Nonspecific gas within nondependent bladder lumen is decreased. Foley catheter balloon inflated within the prostatic urethra with Foley tip at the vesicourethral junction. Stomach/Bowel: Normal non-distended stomach. Left-sided percutaneous pigtail drain terminates in the anterior midline lower peritoneal cavity with nearly resolved fluid collection in this location, now measuring 2.6 x 0.7 cm (series 505/image 66), previously 5.3 x 2.6 cm. No residual gas within this collection. Tip of drain remains in close proximity to the dome of the bladder. Fat stranding surrounding this collection has decreased. Normal caliber small bowel loops with no small bowel wall thickening. Appendix not discretely visualized. Scattered mild colonic diverticulosis with no large bowel wall thickening or acute pericolonic fat stranding. Vascular/Lymphatic: Atherosclerotic nonaneurysmal abdominal aorta. Patent portal, splenic, hepatic and renal veins. No pathologically enlarged lymph nodes in the abdomen or pelvis. Reproductive: Marked prostatomegaly. Other: No pneumoperitoneum. No ascites. No new focal fluid collections. Musculoskeletal: No aggressive appearing focal osseous lesions. Marked lumbar spondylosis. Review of the MIP images confirms the above findings. IMPRESSION: 1. Limited motion degraded scan with no evidence of acute pulmonary embolism. 2. Extensive patchy consolidation and ground-glass opacity in the left greater than right lungs compatible with multilobar pneumonia due to reported COVID-19. 3. Small dependent bilateral pleural effusions. 4. Dilated main pulmonary artery, suggesting pulmonary arterial hypertension. 5. Near complete resolution of anterior midline lower peritoneal cavity fluid collection status post percutaneous drainage. Tip of percutaneous drain remains in close proximity to the dome of the bladder, with fistulization of this residual collection with the bladder lumen not excluded on the  basis of this scan. Decreased gas within the nondependent bladder. 6. Foley catheter balloon is still dilated within the prostatic urethra with Foley tip at the vesicourethral junction. Recommend repositioning/replacement. Bladder is decompressed. No hydronephrosis. 7. Chronic mild diffuse bladder wall thickening is nonspecific and probably due to chronic bladder outlet obstruction by the markedly enlarged prostate. 8. Stable left adrenal nodule, probably an adenoma, for which follow-up adrenal protocol CT abdomen without and with IV contrast may be considered in 12 months. 9. Mild scattered colonic diverticulosis. 10.  Aortic Atherosclerosis (ICD10-I70.0). Electronically Signed   By: Ilona Sorrel M.D.   On: 01/14/2020 15:07   CT ABDOMEN PELVIS W  CONTRAST  Result Date: 01/14/2020 CLINICAL DATA:  COVID-19 positive. Tachypnea. Hypoxia. Inpatient. Lower abdominal abscess status post percutaneous drainage on 01/02/2020. EXAM: CT ANGIOGRAPHY CHEST CT ABDOMEN AND PELVIS WITH CONTRAST TECHNIQUE: Multidetector CT imaging of the chest was performed using the standard protocol during bolus administration of intravenous contrast. Multiplanar CT image reconstructions and MIPs were obtained to evaluate the vascular anatomy. Multidetector CT imaging of the abdomen and pelvis was performed using the standard protocol during bolus administration of intravenous contrast. CONTRAST:  114mL OMNIPAQUE IOHEXOL 350 MG/ML SOLN COMPARISON:  01/11/2020 CT abdomen/pelvis. 12/30/2019 chest radiograph. FINDINGS: CTA CHEST FINDINGS Cardiovascular: The study is low-to-moderate quality for the evaluation of pulmonary embolism, with substantial motion degradation. There are no convincing filling defects in the central, lobar, segmental or subsegmental pulmonary artery branches to suggest acute pulmonary embolism. Atherosclerotic nonaneurysmal thoracic aorta. Dilated main pulmonary artery (3.6 cm diameter). Top-normal heart size. No significant  pericardial fluid/thickening. Left anterior descending and right coronary atherosclerosis. Mediastinum/Nodes: Hypodense bilateral thyroid nodules, largest 2.4 cm on the left. Unremarkable esophagus. No pathologically enlarged axillary, mediastinal or hilar lymph nodes. Lungs/Pleura: No pneumothorax. Small dependent bilateral pleural effusions. Extensive patchy consolidation and ground-glass opacity in the left greater than right lungs involving all lung lobes. No lung masses or discrete pulmonary nodules on these motion degraded images. Musculoskeletal: No aggressive appearing focal osseous lesions. Intact sternotomy wires. Moderate thoracic spondylosis. Review of the MIP images confirms the above findings. CT ABDOMEN and PELVIS FINDINGS Hepatobiliary: Normal liver with no liver mass. Normal gallbladder with no radiopaque cholelithiasis. No biliary ductal dilatation. Pancreas: Normal, with no mass or duct dilation. Spleen: Normal size. No mass. Adrenals/Urinary Tract: Normal right adrenal. Left adrenal 2.0 cm nodule with density 15 HU, unchanged using similar measurement technique in the short interval. No hydronephrosis. Scattered subcentimeter hypodense renal cortical lesions in both kidneys are too small to characterize and are unchanged. Hypodense exophytic 1.3 cm renal cortical lesion in the posterior lower left kidney (series 505/image 44), stable. Normal caliber ureters. Decompressed urinary bladder with chronic diffuse bladder wall thickening. Nonspecific gas within nondependent bladder lumen is decreased. Foley catheter balloon inflated within the prostatic urethra with Foley tip at the vesicourethral junction. Stomach/Bowel: Normal non-distended stomach. Left-sided percutaneous pigtail drain terminates in the anterior midline lower peritoneal cavity with nearly resolved fluid collection in this location, now measuring 2.6 x 0.7 cm (series 505/image 66), previously 5.3 x 2.6 cm. No residual gas within this  collection. Tip of drain remains in close proximity to the dome of the bladder. Fat stranding surrounding this collection has decreased. Normal caliber small bowel loops with no small bowel wall thickening. Appendix not discretely visualized. Scattered mild colonic diverticulosis with no large bowel wall thickening or acute pericolonic fat stranding. Vascular/Lymphatic: Atherosclerotic nonaneurysmal abdominal aorta. Patent portal, splenic, hepatic and renal veins. No pathologically enlarged lymph nodes in the abdomen or pelvis. Reproductive: Marked prostatomegaly. Other: No pneumoperitoneum. No ascites. No new focal fluid collections. Musculoskeletal: No aggressive appearing focal osseous lesions. Marked lumbar spondylosis. Review of the MIP images confirms the above findings. IMPRESSION: 1. Limited motion degraded scan with no evidence of acute pulmonary embolism. 2. Extensive patchy consolidation and ground-glass opacity in the left greater than right lungs compatible with multilobar pneumonia due to reported COVID-19. 3. Small dependent bilateral pleural effusions. 4. Dilated main pulmonary artery, suggesting pulmonary arterial hypertension. 5. Near complete resolution of anterior midline lower peritoneal cavity fluid collection status post percutaneous drainage. Tip of percutaneous drain remains in close proximity  to the dome of the bladder, with fistulization of this residual collection with the bladder lumen not excluded on the basis of this scan. Decreased gas within the nondependent bladder. 6. Foley catheter balloon is still dilated within the prostatic urethra with Foley tip at the vesicourethral junction. Recommend repositioning/replacement. Bladder is decompressed. No hydronephrosis. 7. Chronic mild diffuse bladder wall thickening is nonspecific and probably due to chronic bladder outlet obstruction by the markedly enlarged prostate. 8. Stable left adrenal nodule, probably an adenoma, for which follow-up  adrenal protocol CT abdomen without and with IV contrast may be considered in 12 months. 9. Mild scattered colonic diverticulosis. 10.  Aortic Atherosclerosis (ICD10-I70.0). Electronically Signed   By: Ilona Sorrel M.D.   On: 01/14/2020 15:07   CT ABDOMEN PELVIS W CONTRAST  Result Date: 01/19/2020 CLINICAL DATA:  Unspecified abdominal pain, COVID-19 positive EXAM: CT ABDOMEN AND PELVIS WITH CONTRAST TECHNIQUE: Multidetector CT imaging of the abdomen and pelvis was performed using the standard protocol following bolus administration of intravenous contrast. CONTRAST:  146mL OMNIPAQUE IOHEXOL 300 MG/ML  SOLN COMPARISON:  None. FINDINGS: Lower chest: Mixed ground-glass, consolidative and reticular opacities are present in the lower lobes, right middle lobe and lingula compatible with atypical infection in the setting of COVID-19 positivity. Mild airways thickening is present as well. Hepatobiliary: No focal liver abnormality is seen. No gallstones, gallbladder wall thickening, or biliary dilatation. Pancreas: Fatty replacement of the pancreas. No pancreatic ductal dilatation or surrounding inflammatory changes. Spleen: Normal in size without focal abnormality. 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 No worrisome adrenal lesions. Mild bilateral symmetric perinephric stranding, a nonspecific finding though may correlate with either age or decreased renal function. Kidneys enhance and excrete symmetrically. Some mild left urothelial thickening and periureteral stranding is present. The urinary bladder is circumferentially thickened with multiple foci intraluminal gas. Several punctate foci of gas are seen within the wall of the bladder. These could feasibly reflect gas within small bladder diverticula or crenulation given an enlarged prostate and likely sequela of chronic outlet obstruction however  emphysematous cystitis is not fully excluded. Furthermore, there is focal discontinuity along the dome of the bladder contiguous with a an irregular air and fluid containing collection in the midline abdomen closely apposed to several loops of thickened and enhancing small bowel. An enterovesicular fistula is suspected. Stomach/Bowel: Distal esophagus, stomach and duodenal sweep are unremarkable. There are few focally thickened segments of small bowel with irregular mural enhancement and edematous change in the low anterior mid abdomen centered upon a rim enhancing 5.2 x 2.6 x 7.7cm air and fluid containing collection worrisome for developing abscess or potential enterovesicular fistula given direct continuity the dome of the bladder as detailed above. More distal small bowel has a normal appearance. Appendix is not well visualized. No focal pericecal inflammation is seen. Pancolonic diverticulosis without evidence of acute diverticulitis. Vascular/Lymphatic: Extensive atherosclerotic calcification of the abdominal aorta, iliac arteries and branch vessels. No aneurysm or ectasia is seen. Reactive adenopathy in the low mesentery with some geographic mesenteric stranding involving the affected bowel loops detailed above. Reproductive: The prostate is markedly enlarged. A Foley catheter appears inflated in the low prosthetic urethra. Some focal hypoattenuating cystic foci within the prostate could reflect possible abscess given the adjacent inflammation and stranding. Other: Phlegmonous change in the mid mesentery with the air and fluid containing collection are arising from the dome of the bladder  and or bowel loops. No free intraperitoneal air is seen. No bowel containing hernias. Musculoskeletal: Atrophy of the anterior abdominal wall musculature Multilevel degenerative changes are present in the imaged portions of the spine. No acute osseous abnormality or suspicious osseous lesion. Prior sternotomy changes are  noted. Additional degenerative changes noted in the hips and SI joints bilaterally as well as the symphysis pubis. IMPRESSION: 1. 5.2 x 2.6 x 7.7cm rim enhancing air and fluid containing collection in the low anterior mid abdomen. Collection is directly contiguous with the inflamed bladder dome with several adjacent loops of edematous and thickened small bowel. Overall appearance is worrisome for an abscess with possible enterovesicular fistulization. 2. Bladder itself is circumferentially thickened with gas in the bladder wall. While these foci of air could be within granulations of the bladder given an enlarged prostate and likely some sequela of chronic outlet obstruction, given the patient's extreme white count features are highly suspicious for an emphysematous cystitis. 3. Inflated Foley catheter balloon is positioned within the prostatic urethra. Recommend repositioning or removal. 4. Rim enhancing low attenuation collection within the prostate parenchyma worrisome for potential abscess. 5. Mixed ground-glass, consolidative and reticular opacities in the lower lobes, right middle lobe and lingula compatible with atypical infection in the setting of COVID positivity positivity. 6. Colonic diverticulosis without evidence of diverticulitis. 7. 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 8.  Aortic Atherosclerosis (ICD10-I70.0). These results were called by telephone at the time of interpretation on 01/24/2020 at 4:46 am to provider [facility] , who verbally acknowledged these results. Electronically Signed   By: Lovena Le M.D.   On: 01/15/2020 04:47   CT IMAGE GUIDED DRAINAGE BY PERCUTANEOUS CATHETER  Result Date: 01/11/2020 INDICATION: Abdominal abscess with concern for enteric vesicular fistula. Please perform CT-guided percutaneous drainage catheter placement for infection source control purposes. EXAM: CT IMAGE GUIDED DRAINAGE BY PERCUTANEOUS CATHETER COMPARISON:  CT  abdomen pelvis-01/06/2020 MEDICATIONS: The patient is currently admitted to the hospital and receiving intravenous antibiotics. The antibiotics were administered within an appropriate time frame prior to the initiation of the procedure. ANESTHESIA/SEDATION: Moderate (conscious) sedation was employed during this procedure. A total of Versed 0.5 mg and Fentanyl 50 mcg was administered intravenously. Moderate Sedation Time: 11 minutes. The patient's level of consciousness and vital signs were monitored continuously by radiology nursing throughout the procedure under my direct supervision. CONTRAST:  None COMPLICATIONS: None immediate. PROCEDURE: Informed written consent was obtained from the patient after a discussion of the risks, benefits and alternatives to treatment. The patient was placed supine on the CT gantry and a pre procedural CT was performed re-demonstrating the known abscess/fluid collection within the ventral aspect the lower abdomen with dominant mixed air in fluid containing component measuring approximately 5.0 x 1.9 cm (image 21, series 2). The procedure was planned. A timeout was performed prior to the initiation of the procedure. The skin overlying the ventral aspect of the lower abdomen/pelvis was prepped and draped in the usual sterile fashion. The overlying soft tissues were anesthetized with 1% lidocaine with epinephrine. Appropriate trajectory was planned with the use of a 22 gauge spinal needle. An 18 gauge trocar needle was advanced into the abscess/fluid collection and a short Amplatz super stiff wire was coiled within the collection. Appropriate positioning was confirmed with a limited CT scan. The tract was serially dilated allowing placement of a 10 [place] all-purpose drainage catheter. Appropriate positioning was confirmed with a limited postprocedural CT scan. Approximately  10 ml of blood-tinged serous fluid was aspirated. The tube was connected to a JP bulb and sutured in place. A  dressing was placed. The patient tolerated the procedure well without immediate post procedural complication. IMPRESSION: Successful CT guided placement of a 10 French all purpose drain catheter into the midline of the lower abdomen/pelvis with aspiration of 10 mL of blood-tinged serous fluid. Samples were sent to the laboratory as requested by the ordering clinical team. Electronically Signed   By: Sandi Mariscal M.D.   On: 01/11/2020 08:24    Microbiology No results found for this or any previous visit (from the past 240 hour(s)).  Lab Basic Metabolic Panel: Recent Labs  Lab 01/17/20 0618 01/17/20 0618 01/18/20 0406 01/19/20 0549 01/19/20 0909 01/20/20 0614 2020/02/15 0359  NA 141  --  144  --  144 146* 145  K 3.2*  --  4.3  --  3.9 3.2* 3.9  CL 109  --  115*  --  111 110 110  CO2 26  --  24  --  25 29 28   GLUCOSE 145*  --  140*  --  116* 97 207*  BUN 28*  --  28*  --  31* 26* 25*  CREATININE 0.72   < > 0.64 0.79 0.70 0.73 0.76  CALCIUM 7.7*  --  7.4*  --  8.2* 8.0* 8.3*  MG 2.4  --  2.3  --  2.3 2.0 2.1  PHOS  --   --  2.6  --   --   --  3.1   < > = values in this interval not displayed.   Liver Function Tests: Recent Labs  Lab 01/19/20 0909 01/20/20 0614 2020/02/15 0359  AST 22 18 16   ALT 31 26 23   ALKPHOS 65 63 75  BILITOT 0.8 1.1 0.7  PROT 5.2* 5.4* 6.1*  ALBUMIN 1.9* 2.0* 2.1*   No results for input(s): LIPASE, AMYLASE in the last 168 hours. No results for input(s): AMMONIA in the last 168 hours. CBC: Recent Labs  Lab 01/17/20 0618 01/18/20 0406 01/19/20 0909 01/20/20 0614 2020/02/15 0359  WBC 14.1* 15.0* 21.5* 19.5* 17.3*  NEUTROABS 10.8* 12.3* 16.8* 14.9* 15.0*  HGB 10.1* 10.3* 10.2* 9.9* 10.3*  HCT 30.4* 31.4* 31.5* 30.4* 32.0*  MCV 91.8 93.7 95.2 93.3 94.1  PLT 123* 134* 136* 144* 153   Cardiac Enzymes: No results for input(s): CKTOTAL, CKMB, CKMBINDEX, TROPONINI in the last 168 hours. Sepsis Labs: Recent Labs  Lab 01/18/20 0406 01/19/20 0909  01/20/20 0614 02-15-2020 0359  PROCALCITON  --   --  0.10  --   WBC 15.0* 21.5* 19.5* 17.3*  LATICACIDVEN  --  1.4  --   --     Procedures/Operations  CT-guided drainage on 01/16/2020   Eldonna Neuenfeldt 01/23/2020, 2:32 PM

## 2020-02-01 NOTE — Progress Notes (Signed)
   01/29/2020 2000  Clinical Encounter Type  Visited With Family;Health care provider  Visit Type Follow-up  Referral From Chaplain  Consult/Referral To Chaplain  Stress Factors  Family Stress Factors Loss;Major life changes  Chaplain stopped by nurse's station and was informed patient just passed. Chaplain told nurse she was in to see patient twenty minutes ago. Chaplain said that she wish she had daugther Susan's telephone number and the nurse gave it to her, [phone]).  Chaplain called daughter, [name] and offered her condolences. Chaplain informed [name], that she had recently checked in on her father and pulled pray shawl up over his shoulders. [name] said that is good because he stays cold. [name] was glad to hear from [place]. [name] said her husband, [name] was taking it hard and that she had to be the strong one. [name] said she had to be strong because she has to take care of her mother. [name] said that her and her father talked about her caring for her mother. Chaplain advised daughter to make sure she takes time to grieve herself. Chaplain also said that daughter has to take care of herself, so that she can take care of her mother. Leanord Asal Chaplain for calling and Chaplain told [name] that she will be praying for the family. Chaplain will check on the family in a few days.

## 2020-02-01 NOTE — Progress Notes (Signed)
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 Patient is now growing out Pseudomonas from his urine as well as from the abscess area,sensitive to Zosyn.Patient is also COVID-19 positive with acute hypoxia requiring 4 L/min and has associated tachypnea. He was previously treated for this during prior admission, discharged on 01/01/20. CTA chest obtained 1/14negative forPE.Increased oxygen requirements and tachypnea 1/15, in addition to A-flutter with RVR requiring amiodarone drip for rate control.  Subjective 1/20: Actively dying, family at bedside  Assessment & Plan:   Principal Problem:   Postprocedural intraabdominal abscess Active Problems:   Hypertensive heart disease with heart failure (HCC)   Unspecified atrial fibrillation (HCC)   Unspecified dementia without behavioral disturbance (HCC)   Chronic diastolic (congestive) heart failure (HCC)   Emphysematous cystitis   History of COVID-19   Atrial flutter with rapid ventricular response ([place])   Palliative care by specialist   Terminal care   Prostate abscess   Acute respiratory failure with hypoxia (HCC)   Intra-abdominal fluid collection   Agitation   Abdominal Pain secondary to below:resolved 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 at another hospital Emphysematous cystitis with possible prostate abscess Acute Hypoxic Respiratory Failure with hypoxia - in setting of recent Covid PNA Moderate Protein calorie malnutrition Hypokalemia  Hypernatremia  Iron Deficiency Anemia  Atrial Flutter with RVR Chronic  A-Fib History of COVID-19pneumonia Marked leukocytosis Coronary disease, secondary prevention Hypertension Chronic diastolic CHF,potentially with decompensation contributing to degree of hypoxia BPH GERD Seizure disorder Dementia without behavioral disturbance   After long d/w family and Palliative care at bedside - decision for comfort care made. Patient is actively dying and expecting in hospital demise.  Code Status: DNR Family Communication:none at bedside  Disposition Plan:Expecting in hospital death but if survives - [place]   Consultants:  Urology  General surgery  Infectious Disease  Pulmonology  Palliative care  Procedures:Placement of abdominal abscess drain percutaneously by IR   Objective: Vitals:   02/06/2020 0604 Feb 06, 2020 0630 02/06/2020 0907 02-06-2020 1358  BP: (!) 155/91  (!) 160/47 (!) 156/96  Pulse: (!) 57  67 86  Resp: (!) 24  (!) 24 (!) 24  Temp: 97.6 F (36.4 C)  97.7 F (36.5 C) 98.5 F (36.9 C)  TempSrc: Oral  Oral   SpO2: 100% 91% 98%   Weight:      Height:        Intake/Output Summary (Last 24 hours) at Feb 06, 2020 2108 Last data filed at 06-Feb-2020 1531 Gross per 24 hour  Intake 1148.08 ml  Output 615 ml  Net 533.08 ml   Filed Weights   01/17/20 0500 01/18/20 0359 06-Feb-2020 0456  Weight: 87.5 kg 88 kg 85.6 kg    Examination:  Constitutional:      Appearance: He is ill-appearing.  HENT:     Head: Normocephalic and atraumatic.  Cardiovascular:     Rate and Rhythm: Rhythm irregularly irregular.  Pulmonary:     Effort: Tachypnea and accessory muscle usage present.     Breath sounds: Decreased breath sounds and rhonchi present.     Comments: 15L HFNC and 15L NRB mask. RN to initiate morphine infusion.  Audible secretions. Abdominal:     Tenderness: There is no abdominal tenderness.  Skin:    General: Skin is warm and dry.  Neurological:     Mental Status: He is lethargic.     Comments: Lethargic,  moaning, not following commands   Data Reviewed: I have personally reviewed following labs and imaging studies  CBC: Recent Labs  Lab 01/17/20 0618 01/18/20 0406 01/19/20 0909 01/20/20 0614 02/10/2020 0359  WBC 14.1* 15.0* 21.5* 19.5* 17.3*  NEUTROABS 10.8* 12.3* 16.8* 14.9* 15.0*  HGB 10.1* 10.3* 10.2* 9.9* 10.3*  HCT 30.4* 31.4* 31.5* 30.4* 32.0*  MCV 91.8 93.7 95.2 93.3 94.1  PLT 123* 134* 136* 144* 0000000   Basic Metabolic Panel: Recent Labs  Lab 01/17/20 0618 01/17/20 0618 01/18/20 0406 01/19/20 0549 01/19/20 0909 01/20/20 0614 2020-02-10 0359  NA 141  --  144  --  144 146* 145  K 3.2*  --  4.3  --  3.9 3.2* 3.9  CL 109  --  115*  --  111 110 110  CO2 26  --  24  --  25 29 28   GLUCOSE 145*  --  140*  --  116* 97 207*  BUN 28*  --  28*  --  31* 26* 25*  CREATININE 0.72   < > 0.64 0.79 0.70 0.73 0.76  CALCIUM 7.7*  --  7.4*  --  8.2* 8.0* 8.3*  MG 2.4  --  2.3  --  2.3 2.0 2.1  PHOS  --   --  2.6  --   --   --  3.1   < > = values in this interval not displayed.   GFR: Estimated Creatinine Clearance: 77.3 mL/min (by C-G formula based on SCr of 0.76 mg/dL). Liver Function Tests: Recent Labs  Lab 01/16/20 0647 01/19/20 0909 01/20/20 0614 2020-02-10 0359  AST 18 22 18 16   ALT 19 31 26 23   ALKPHOS 65 65 63 75  BILITOT 0.9 0.8 1.1 0.7  PROT 5.6* 5.2* 5.4* 6.1*  ALBUMIN 1.9* 1.9* 2.0* 2.1*   CBG: Recent Labs  Lab 01/15/20 1716 01/20/20 0207  GLUCAP 136* 90   Lipid Profile: No results for input(s): CHOL, HDL, LDLCALC, TRIG, CHOLHDL, LDLDIRECT in the last 72 hours. Thyroid Function Tests: No results for input(s): TSH, T4TOTAL, FREET4, T3FREE, THYROIDAB in the last 72 hours. Anemia Panel: Recent Labs    01/20/20 0614  VITAMINB12 738  FOLATE 6.2  FERRITIN 364*  TIBC 153*  IRON 8*  RETICCTPCT 2.5   Sepsis Labs: Recent Labs  Lab 01/19/20 0909 01/20/20 0614  PROCALCITON  --  0.10  LATICACIDVEN 1.4  --     No results found for this or any previous  visit (from the past 240 hour(s)).       Radiology Studies: No results found.      Scheduled Meds: . scopolamine  1 patch Transdermal Q72H  . sodium chloride flush  5 mL Intracatheter Q8H   Continuous Infusions: . sodium chloride 250 mL (01/19/20 2215)  . morphine 0.5 mg/hr (Feb 10, 2020 1531)     LOS: 13 days    Time spent: 30-35 minutes    Max Sane, MD [facility]    2020-02-10, 9:08 PM

## 2020-02-01 NOTE — Progress Notes (Signed)
Per Tammy from infection prevention okay for RN to [place] for this pt. He has completed his 21 days after testing covid positive. RN will continue to assess and monitor pt.

## 2020-02-01 NOTE — Progress Notes (Signed)
Patient passed away at [facility]. Both myself and a second RN called the time of death, and notified the MD and [facility] on call. I called and spoke with his daughter shortly after. Pt's daughter asked that his belongings be donated/disposed of.

## 2020-02-01 DEATH — deceased

## 2020-06-23 IMAGING — CT CT ABD-PELV W/O CM
2 of 4 series · 15 of 46 positions shown, 17 images · non-contrast
Comparison: January 14, 2020

CLINICAL DATA: Urinary catheter displacement, unable to determine
output

EXAM:
CT ABDOMEN AND PELVIS WITHOUT CONTRAST
TECHNIQUE: Multidetector CT imaging of the abdomen and pelvis was performed
following the standard protocol without IV contrast.

[Series 2: routine abd/pel wo · axial · 0.98mm/px · z∈[-1296,-776]mm · 12 of 124 slices shown, 14 images]
[im 10/124  soft-tissue]
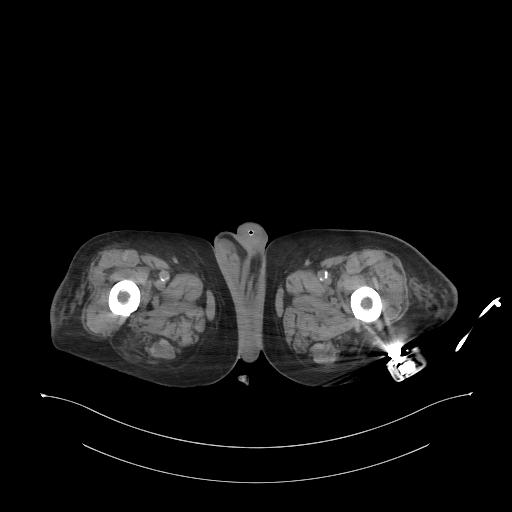
[im 10/124  bone]
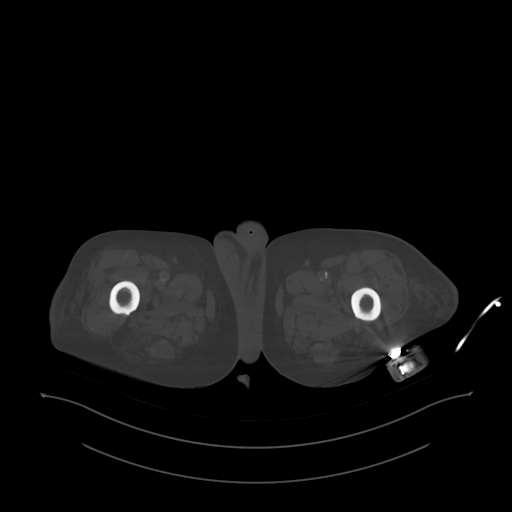
[im 19/124  soft-tissue]
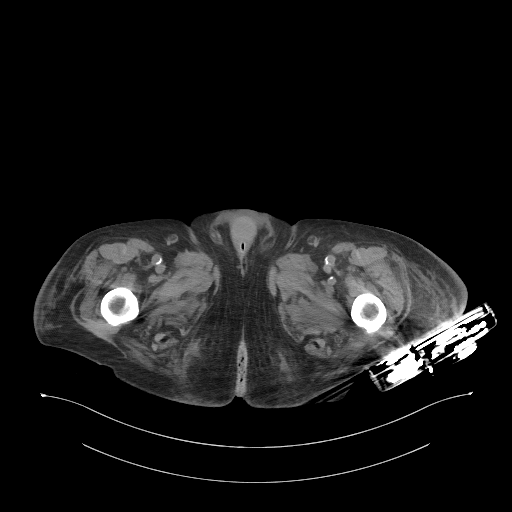
[im 29/124  soft-tissue]
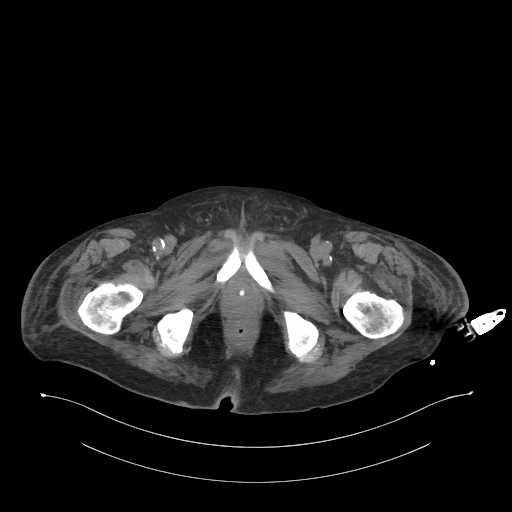
[im 38/124  soft-tissue]
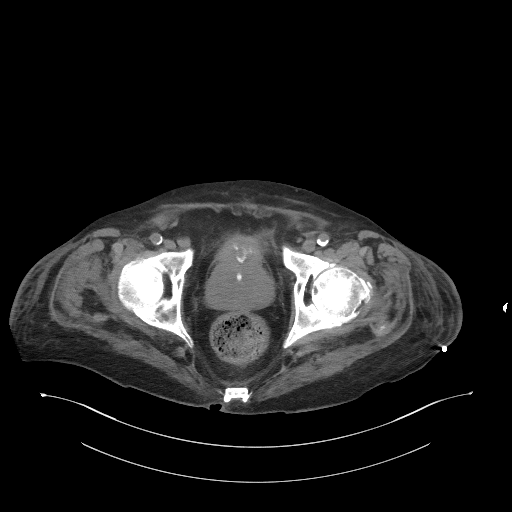
[im 48/124  soft-tissue]
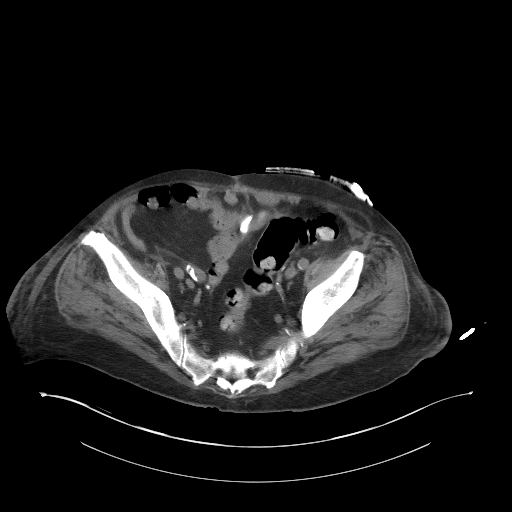
[im 57/124  soft-tissue]
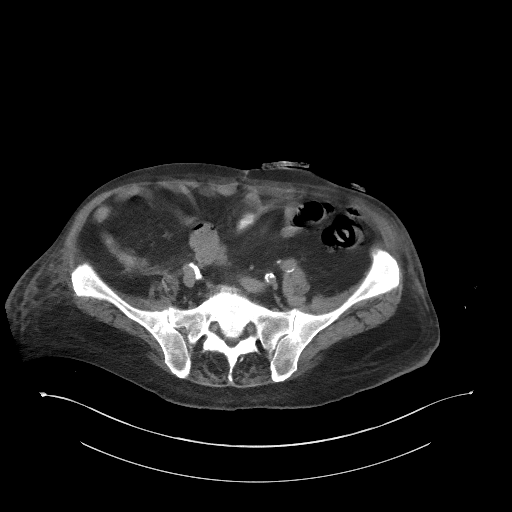
[im 67/124  soft-tissue]
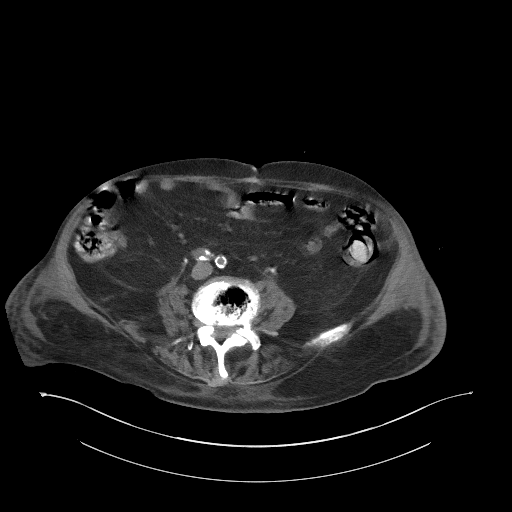
[im 76/124  soft-tissue]
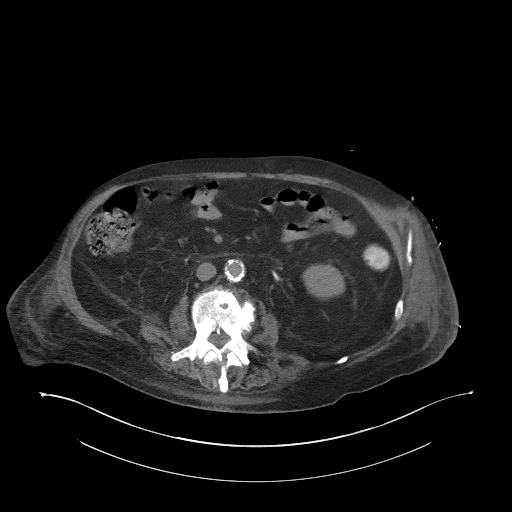
[im 86/124  soft-tissue]
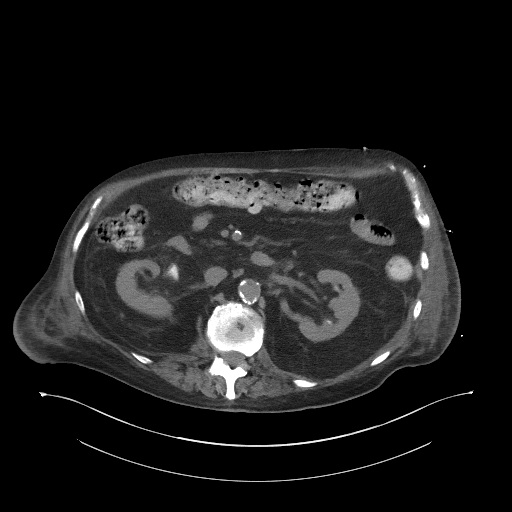
[im 86/124  bone]
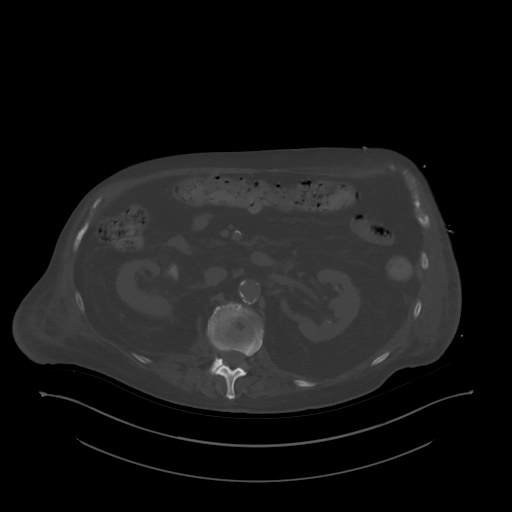
[im 95/124  soft-tissue]
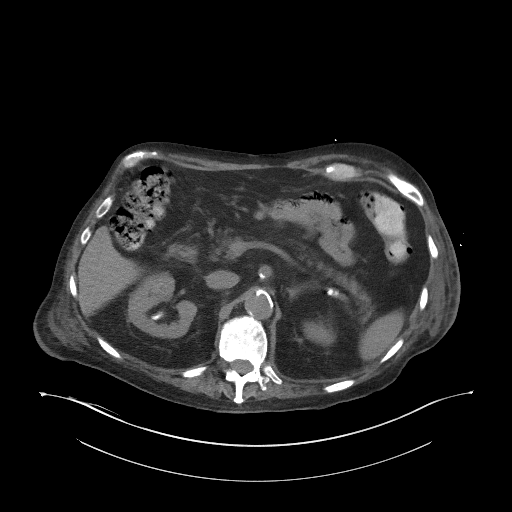
[im 105/124  soft-tissue]
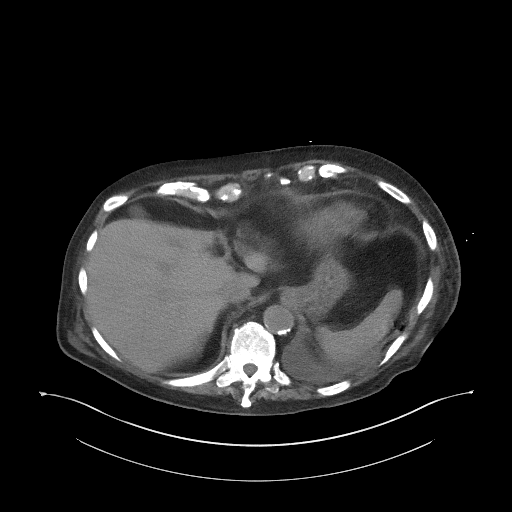
[im 114/124  soft-tissue]
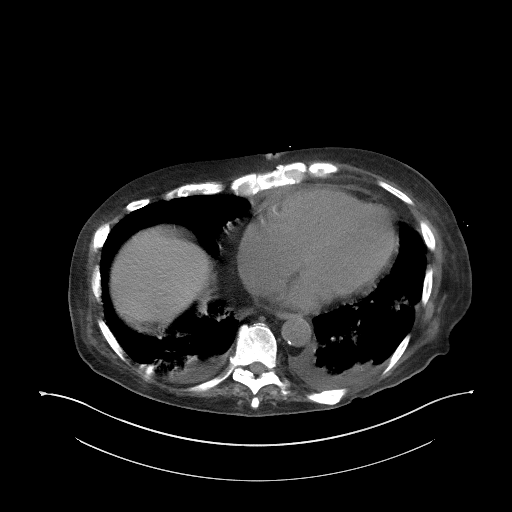

[Series 5: coronal st · coronal · 0.96mm/px · 3 of 80 slices shown]
[im 27/80  soft-tissue]
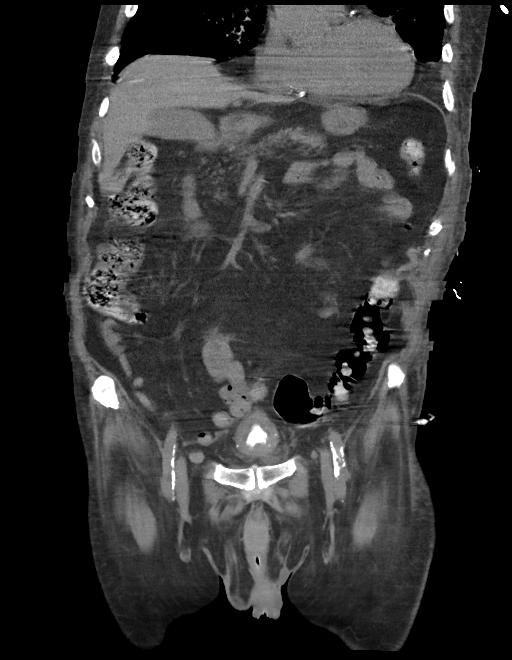
[im 36/80  soft-tissue]
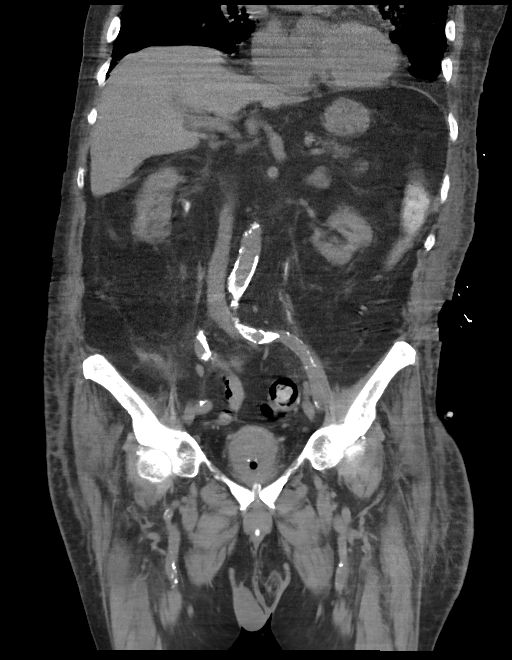
[im 44/80  soft-tissue]
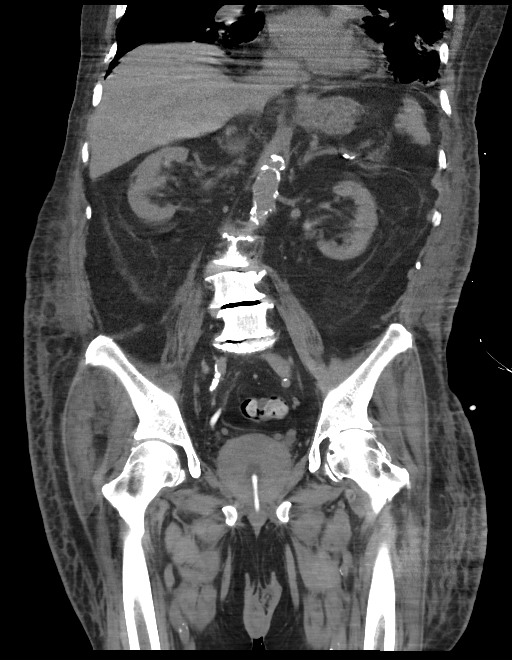

[15 of 46 positions shown; findings below may reference images not displayed]

FINDINGS: Lower chest: The visualized heart size within normal limits. No
pericardial fluid/thickening.

No hiatal hernia.

Coronary artery calcifications are seen. Small bilateral pleural
effusions are noted. Patchy ground-glass opacities are seen at both
lung bases.

Hepatobiliary: Although limited due to the lack of intravenous
contrast, normal in appearance without gross focal abnormality. No
evidence of calcified gallstones or biliary ductal dilatation.

Pancreas:  Unremarkable.  No surrounding inflammatory changes.

Spleen: Normal in size. Although limited due to the lack of
intravenous contrast, normal in appearance.

Adrenals/Urinary Tract: Both adrenal glands appear normal. Bilateral
renal atrophy is seen. No hydronephrosis. No renal or collecting
system calculi. Foley catheter balloon is seen again inflated within
the prostatic urethra. The tip appears to be just projecting into
the partially decompressed bladder. There is a small amount of air
seen at the vesicoureteral junction. There is contrast seen within
the partially decompressed bladder with diffuse wall thickening and
calcifications. Small foci of air seen within the bladder dome with
a fistulous tract extending to the left anterior abdominal wall
collection.

Stomach/Bowel: The stomach, small bowel, and colon are normal in
appearance. No inflammatory changes or obstructive findings.
Scattered colonic diverticula are noted. There is a moderate to
large amount of colonic stool.

Vascular/Lymphatic: There are no enlarged abdominal or pelvic lymph
nodes. Scattered aortic atherosclerotic calcifications are seen
without aneurysmal dilatation.

Reproductive: Enlarged heterogeneous prostate gland.

Other: A left lower anterior abdominal wall pigtail catheter is
seen. There is a residual small collection measuring 2.9 cm which
now appears to have a small amount of contrast seen within it. There
are small foci of air at the dome of the bladder with a possible
fistulous tract between the collection and the dome of the bladder,
series 2, image 78.

Musculoskeletal: No acute or significant osseous findings.
IMPRESSION: 1. Small bilateral pleural effusions with adjacent patchy airspace
consolidation, consistent with COVID pneumonia.
2. Foley catheter balloon dilated within the prosthetic urethra as
on prior exam.
3. Contrast within a chronically thickened partially decompressed
bladder.
4. Left lower anterior abdominal pigtail catheter within a 2.9 cm
collection that now contains contrast, there appears to be a small
fistulous tract extending to the bladder dome where there are small
foci of air as described above.
5. Diverticulosis without diverticulitis.
6.  Aortic Atherosclerosis ([id number]-2V8.8).
